# Patient Record
Sex: Male | Born: 1935 | Race: White | Hispanic: No | Marital: Single | Smoking: Never smoker
Health system: Southern US, Community
[De-identification: ages and names within clinical notes are randomized; demographics above are authoritative.]

## PROBLEM LIST (undated history)

## (undated) DIAGNOSIS — F0390 Unspecified dementia without behavioral disturbance: Secondary | ICD-10-CM

## (undated) DIAGNOSIS — D126 Benign neoplasm of colon, unspecified: Secondary | ICD-10-CM

## (undated) HISTORY — PX: CATARACT EXTRACTION W/ INTRAOCULAR LENS  IMPLANT, BILATERAL: SHX1307

---

## 2001-01-17 ENCOUNTER — Encounter (INDEPENDENT_AMBULATORY_CARE_PROVIDER_SITE_OTHER): Payer: Self-pay

## 2001-01-17 ENCOUNTER — Ambulatory Visit (HOSPITAL_COMMUNITY): Admission: RE | Admit: 2001-01-17 | Discharge: 2001-01-17 | Payer: Self-pay | Admitting: Anesthesiology

## 2002-06-08 ENCOUNTER — Other Ambulatory Visit: Admission: RE | Admit: 2002-06-08 | Discharge: 2002-06-08 | Payer: Self-pay | Admitting: Family Medicine

## 2004-09-29 ENCOUNTER — Ambulatory Visit (HOSPITAL_COMMUNITY): Admission: RE | Admit: 2004-09-29 | Discharge: 2004-09-29 | Payer: Self-pay | Admitting: Gastroenterology

## 2009-05-09 ENCOUNTER — Emergency Department (HOSPITAL_COMMUNITY): Admission: EM | Admit: 2009-05-09 | Discharge: 2009-05-09 | Payer: Self-pay | Admitting: Emergency Medicine

## 2010-12-29 ENCOUNTER — Encounter: Payer: Self-pay | Admitting: Family Medicine

## 2011-03-16 LAB — DIFFERENTIAL
Basophils Absolute: 0 10*3/uL (ref 0.0–0.1)
Basophils Relative: 1 % (ref 0–1)
Eosinophils Relative: 1 % (ref 0–5)
Lymphocytes Relative: 27 % (ref 12–46)
Monocytes Absolute: 0.6 10*3/uL (ref 0.1–1.0)
Monocytes Relative: 10 % (ref 3–12)
Neutro Abs: 4 10*3/uL (ref 1.7–7.7)

## 2011-03-16 LAB — URINALYSIS, ROUTINE W REFLEX MICROSCOPIC
Ketones, ur: NEGATIVE mg/dL
Nitrite: NEGATIVE
Protein, ur: NEGATIVE mg/dL
Urobilinogen, UA: 0.2 mg/dL (ref 0.0–1.0)

## 2011-03-16 LAB — CBC
HCT: 42.6 % (ref 39.0–52.0)
Platelets: 224 10*3/uL (ref 150–400)
RDW: 13.3 % (ref 11.5–15.5)
WBC: 6.5 10*3/uL (ref 4.0–10.5)

## 2011-03-16 LAB — COMPREHENSIVE METABOLIC PANEL
AST: 28 U/L (ref 0–37)
Albumin: 4.2 g/dL (ref 3.5–5.2)
Alkaline Phosphatase: 60 U/L (ref 39–117)
BUN: 8 mg/dL (ref 6–23)
Chloride: 106 mEq/L (ref 96–112)
GFR calc Af Amer: >60 mL/min (ref >60)
Potassium: 4.2 mEq/L (ref 3.5–5.1)
Total Bilirubin: 1.4 mg/dL — ABNORMAL HIGH (ref 0.3–1.2)
Total Protein: 7.2 g/dL (ref 6.0–8.3)

## 2011-04-24 NOTE — Procedures (Signed)
[facility]  Patient:    Anthony Fisher, Anthony Fisher                     MRN: [id number] Proc. Date: 01/17/01 Adm. Date:  [id number] Attending:  Orland Mustard                           Procedure Report  PROCEDURE:  Colonoscopy with polypectomy.  MEDICATIONS:  Fentanyl 50 mcg, Versed 5 mg IV.  SCOPE:  Olympus adult video colonoscope.  INDICATIONS FOR PROCEDURE:  A nice gentleman whose had previous colon polyps removed dating back to 67. This is done as a three year follow-up.  DESCRIPTION OF PROCEDURE:  The procedure had been explained to the patient and consent obtained. With the patient in the left lateral decubitus position, the Olympus adult video colonoscope was inserted and advanced under direct visualization. The prep was quite good. We were able to advance to the cecum without difficulty. The right lower quadrant was transilluminated, the ileocecal valve seen. The scope withdrawn. The cecum, ascending colon, hepatic flexure, transverse colon, splenic flexure, and descending colon were seen well. At  70 cm from the anal verge, a 1/2 cm polyp on a short stalk was encountered and was removed with the snare and sucked through the scope. There was no significant bleeding at the polypectomy site. The sigmoid colon was also free of any further polyps. The scope was withdrawn. The patient tolerated the procedure well maintained on low flow oxygen and pulse oximeter throughout the procedure with no obvious problem.  ASSESSMENT:  Descending colon polyps removed.  PLAN:  Check path, routine post polypectomy instructions. Will recommend repeating in three years. DD:  01/17/01 TD:  01/18/01 Job: [id number]

## 2011-04-24 NOTE — Op Note (Signed)
NAME:  Anthony Fisher, Anthony Fisher              ACCOUNT NO.:  [id number]   MEDICAL RECORD NO.:  [id number]          PATIENT TYPE:  AMB   LOCATION:  ENDO                         FACILITY:  [facility]   PHYSICIAN:  James L. Malon Kindle., M.D.DATE OF BIRTH:  06-Sep-1926   DATE OF PROCEDURE:  DATE OF DISCHARGE:                                 OPERATIVE REPORT   PROCEDURE:  Colonoscopy.   MEDICATIONS:  1.  Fentanyl 50 mcg.  2.  Versed 5 mg IV.   SCOPE:  Olympus pediatric colonoscopy.   INDICATION:  Previous history of adenomatous colon polyps.   DESCRIPTION OF PROCEDURE:  The procedure was explained to the patient and  consent obtained.  With the patient in the left lateral decubitus position,  the Olympus scope was inserted.  The pediatric adjustable scope was used.  We were able to easily reach the cecum.  Ileocecal valve and appendiceal  orifice were seen.  The scope was withdrawn in the cecum.  Ascending colon,  transverse colon, splenic flexure, descending, and sigmoid colon were seen  well. No polyps or other lesions were seen.  The scope was withdrawn.  The  patient tolerated the procedure well.   ASSESSMENT:  Previous history of colon polyps, with negative colonoscopy.   PLAN:  Will recommend yearly hemoccults, and would recommend repeating  colonoscopy in five years.      JLE/MEDQ  D:  09/29/2004  T:  09/29/2004  Job:  [id number]   cc:   Saul Fordyce, N.P.  [facility]  [facility]  [place] L. Malon Kindle., M.D.  [address]  [place]  [place] [address]  Fax: [phone]

## 2015-02-06 ENCOUNTER — Ambulatory Visit
Admission: RE | Admit: 2015-02-06 | Discharge: 2015-02-06 | Disposition: A | Discharge status: Home / Self Care | Payer: Medicare Other | Source: Ambulatory Visit | Attending: Internal Medicine | Admitting: Internal Medicine

## 2015-02-06 ENCOUNTER — Other Ambulatory Visit: Payer: Self-pay | Admitting: Internal Medicine

## 2015-02-06 DIAGNOSIS — R609 Edema, unspecified: Secondary | ICD-10-CM

## 2015-02-06 DIAGNOSIS — S5001XA Contusion of right elbow, initial encounter: Secondary | ICD-10-CM | POA: Diagnosis not present

## 2015-02-06 DIAGNOSIS — S5000XA Contusion of unspecified elbow, initial encounter: Secondary | ICD-10-CM | POA: Diagnosis not present

## 2015-03-04 DIAGNOSIS — Z1389 Encounter for screening for other disorder: Secondary | ICD-10-CM | POA: Diagnosis not present

## 2015-03-04 DIAGNOSIS — R1032 Left lower quadrant pain: Secondary | ICD-10-CM | POA: Diagnosis not present

## 2015-03-04 DIAGNOSIS — Z1211 Encounter for screening for malignant neoplasm of colon: Secondary | ICD-10-CM | POA: Diagnosis not present

## 2015-03-04 DIAGNOSIS — E784 Other hyperlipidemia: Secondary | ICD-10-CM | POA: Diagnosis not present

## 2015-03-04 DIAGNOSIS — Z0000 Encounter for general adult medical examination without abnormal findings: Secondary | ICD-10-CM | POA: Diagnosis not present

## 2015-03-04 DIAGNOSIS — I70219 Atherosclerosis of native arteries of extremities with intermittent claudication, unspecified extremity: Secondary | ICD-10-CM | POA: Diagnosis not present

## 2015-03-04 DIAGNOSIS — R42 Dizziness and giddiness: Secondary | ICD-10-CM | POA: Diagnosis not present

## 2015-03-04 DIAGNOSIS — R002 Palpitations: Secondary | ICD-10-CM | POA: Diagnosis not present

## 2015-03-04 DIAGNOSIS — H8309 Labyrinthitis, unspecified ear: Secondary | ICD-10-CM | POA: Diagnosis not present

## 2015-03-04 DIAGNOSIS — H9113 Presbycusis, bilateral: Secondary | ICD-10-CM | POA: Diagnosis not present

## 2015-04-02 DIAGNOSIS — E780 Pure hypercholesterolemia: Secondary | ICD-10-CM | POA: Diagnosis not present

## 2015-04-02 DIAGNOSIS — R5383 Other fatigue: Secondary | ICD-10-CM | POA: Diagnosis not present

## 2015-04-02 DIAGNOSIS — Z131 Encounter for screening for diabetes mellitus: Secondary | ICD-10-CM | POA: Diagnosis not present

## 2015-04-02 DIAGNOSIS — Z125 Encounter for screening for malignant neoplasm of prostate: Secondary | ICD-10-CM | POA: Diagnosis not present

## 2015-04-02 DIAGNOSIS — Z79899 Other long term (current) drug therapy: Secondary | ICD-10-CM | POA: Diagnosis not present

## 2016-11-10 ENCOUNTER — Other Ambulatory Visit: Payer: Self-pay | Admitting: Internal Medicine

## 2016-11-10 DIAGNOSIS — R413 Other amnesia: Secondary | ICD-10-CM

## 2016-11-12 ENCOUNTER — Ambulatory Visit
Admission: RE | Admit: 2016-11-12 | Discharge: 2016-11-12 | Disposition: A | Discharge status: Home / Self Care | Payer: Medicare Other | Source: Ambulatory Visit | Attending: Internal Medicine | Admitting: Internal Medicine

## 2016-11-12 DIAGNOSIS — R413 Other amnesia: Secondary | ICD-10-CM

## 2017-02-15 ENCOUNTER — Emergency Department (HOSPITAL_COMMUNITY): Payer: Medicare Other

## 2017-02-15 ENCOUNTER — Encounter (HOSPITAL_COMMUNITY): Payer: Self-pay

## 2017-02-15 ENCOUNTER — Inpatient Hospital Stay (HOSPITAL_COMMUNITY)
Admission: EM | Admit: 2017-02-15 | Discharge: 2017-02-23 | DRG: 065 | Disposition: A | Discharge status: Rehabilitation Facility | Payer: Medicare Other | Attending: Neurology | Admitting: Neurology

## 2017-02-15 DIAGNOSIS — G8194 Hemiplegia, unspecified affecting left nondominant side: Secondary | ICD-10-CM | POA: Diagnosis present

## 2017-02-15 DIAGNOSIS — R7989 Other specified abnormal findings of blood chemistry: Secondary | ICD-10-CM | POA: Diagnosis not present

## 2017-02-15 DIAGNOSIS — I509 Heart failure, unspecified: Secondary | ICD-10-CM

## 2017-02-15 DIAGNOSIS — R1310 Dysphagia, unspecified: Secondary | ICD-10-CM | POA: Diagnosis present

## 2017-02-15 DIAGNOSIS — I110 Hypertensive heart disease with heart failure: Secondary | ICD-10-CM | POA: Diagnosis present

## 2017-02-15 DIAGNOSIS — H9190 Unspecified hearing loss, unspecified ear: Secondary | ICD-10-CM | POA: Diagnosis present

## 2017-02-15 DIAGNOSIS — R471 Dysarthria and anarthria: Secondary | ICD-10-CM | POA: Diagnosis present

## 2017-02-15 DIAGNOSIS — R059 Cough, unspecified: Secondary | ICD-10-CM

## 2017-02-15 DIAGNOSIS — I619 Nontraumatic intracerebral hemorrhage, unspecified: Secondary | ICD-10-CM | POA: Diagnosis not present

## 2017-02-15 DIAGNOSIS — I618 Other nontraumatic intracerebral hemorrhage: Secondary | ICD-10-CM | POA: Diagnosis not present

## 2017-02-15 DIAGNOSIS — I610 Nontraumatic intracerebral hemorrhage in hemisphere, subcortical: Secondary | ICD-10-CM | POA: Diagnosis not present

## 2017-02-15 DIAGNOSIS — D649 Anemia, unspecified: Secondary | ICD-10-CM | POA: Diagnosis present

## 2017-02-15 DIAGNOSIS — F0390 Unspecified dementia without behavioral disturbance: Secondary | ICD-10-CM | POA: Diagnosis present

## 2017-02-15 DIAGNOSIS — R4781 Slurred speech: Secondary | ICD-10-CM | POA: Diagnosis present

## 2017-02-15 DIAGNOSIS — E876 Hypokalemia: Secondary | ICD-10-CM | POA: Diagnosis present

## 2017-02-15 DIAGNOSIS — I161 Hypertensive emergency: Secondary | ICD-10-CM | POA: Diagnosis not present

## 2017-02-15 DIAGNOSIS — I10 Essential (primary) hypertension: Secondary | ICD-10-CM | POA: Diagnosis not present

## 2017-02-15 DIAGNOSIS — Z9842 Cataract extraction status, left eye: Secondary | ICD-10-CM

## 2017-02-15 DIAGNOSIS — R29810 Facial weakness: Secondary | ICD-10-CM | POA: Diagnosis present

## 2017-02-15 DIAGNOSIS — G8192 Hemiplegia, unspecified affecting left dominant side: Secondary | ICD-10-CM | POA: Diagnosis not present

## 2017-02-15 DIAGNOSIS — I5042 Chronic combined systolic (congestive) and diastolic (congestive) heart failure: Secondary | ICD-10-CM | POA: Diagnosis not present

## 2017-02-15 DIAGNOSIS — R0989 Other specified symptoms and signs involving the circulatory and respiratory systems: Secondary | ICD-10-CM

## 2017-02-15 DIAGNOSIS — R1312 Dysphagia, oropharyngeal phase: Secondary | ICD-10-CM | POA: Diagnosis not present

## 2017-02-15 DIAGNOSIS — I471 Supraventricular tachycardia: Secondary | ICD-10-CM | POA: Diagnosis not present

## 2017-02-15 DIAGNOSIS — R339 Retention of urine, unspecified: Secondary | ICD-10-CM | POA: Diagnosis present

## 2017-02-15 DIAGNOSIS — R05 Cough: Secondary | ICD-10-CM

## 2017-02-15 DIAGNOSIS — H53462 Homonymous bilateral field defects, left side: Secondary | ICD-10-CM | POA: Diagnosis present

## 2017-02-15 DIAGNOSIS — R531 Weakness: Secondary | ICD-10-CM | POA: Diagnosis not present

## 2017-02-15 DIAGNOSIS — R509 Fever, unspecified: Secondary | ICD-10-CM | POA: Diagnosis not present

## 2017-02-15 DIAGNOSIS — Z9841 Cataract extraction status, right eye: Secondary | ICD-10-CM

## 2017-02-15 DIAGNOSIS — I69391 Dysphagia following cerebral infarction: Secondary | ICD-10-CM | POA: Diagnosis not present

## 2017-02-15 DIAGNOSIS — Z4659 Encounter for fitting and adjustment of other gastrointestinal appliance and device: Secondary | ICD-10-CM

## 2017-02-15 DIAGNOSIS — I6789 Other cerebrovascular disease: Secondary | ICD-10-CM | POA: Diagnosis not present

## 2017-02-15 DIAGNOSIS — R739 Hyperglycemia, unspecified: Secondary | ICD-10-CM | POA: Diagnosis present

## 2017-02-15 DIAGNOSIS — N390 Urinary tract infection, site not specified: Secondary | ICD-10-CM | POA: Diagnosis not present

## 2017-02-15 DIAGNOSIS — I615 Nontraumatic intracerebral hemorrhage, intraventricular: Secondary | ICD-10-CM

## 2017-02-15 DIAGNOSIS — M79609 Pain in unspecified limb: Secondary | ICD-10-CM | POA: Diagnosis not present

## 2017-02-15 DIAGNOSIS — Z961 Presence of intraocular lens: Secondary | ICD-10-CM | POA: Diagnosis present

## 2017-02-15 DIAGNOSIS — J9811 Atelectasis: Secondary | ICD-10-CM | POA: Diagnosis not present

## 2017-02-15 DIAGNOSIS — W19XXXA Unspecified fall, initial encounter: Secondary | ICD-10-CM | POA: Diagnosis not present

## 2017-02-15 DIAGNOSIS — F0391 Unspecified dementia with behavioral disturbance: Secondary | ICD-10-CM | POA: Diagnosis not present

## 2017-02-15 DIAGNOSIS — A499 Bacterial infection, unspecified: Secondary | ICD-10-CM | POA: Diagnosis not present

## 2017-02-15 HISTORY — DX: Unspecified dementia, unspecified severity, without behavioral disturbance, psychotic disturbance, mood disturbance, and anxiety: F03.90

## 2017-02-15 HISTORY — DX: Benign neoplasm of colon, unspecified: D12.6

## 2017-02-15 LAB — CBC
HCT: 37.5 % — ABNORMAL LOW (ref 39.0–52.0)
Hemoglobin: 12.9 g/dL — ABNORMAL LOW (ref 13.0–17.0)
MCH: 33.1 pg (ref 26.0–34.0)
MCHC: 34.4 g/dL (ref 30.0–36.0)
MCV: 96.2 fL (ref 78.0–100.0)
Platelets: 206 10*3/uL (ref 150–400)
RBC: 3.9 MIL/uL — ABNORMAL LOW (ref 4.22–5.81)
RDW: 12.8 % (ref 11.5–15.5)
WBC: 5.8 10*3/uL (ref 4.0–10.5)

## 2017-02-15 LAB — COMPREHENSIVE METABOLIC PANEL
ALBUMIN: 3.9 g/dL (ref 3.5–5.0)
ALT: 8 U/L — ABNORMAL LOW (ref 17–63)
AST: 18 U/L (ref 15–41)
Alkaline Phosphatase: 63 U/L (ref 38–126)
Anion gap: 10 (ref 5–15)
BUN: 16 mg/dL (ref 6–20)
CHLORIDE: 108 mmol/L (ref 101–111)
CO2: 23 mmol/L (ref 22–32)
Calcium: 9.2 mg/dL (ref 8.9–10.3)
Creatinine, Ser: 1.08 mg/dL (ref 0.61–1.24)
GFR calc Af Amer: >60 mL/min (ref >60)
GFR calc non Af Amer: 59 mL/min — ABNORMAL LOW (ref >60)
GLUCOSE: 133 mg/dL — AB (ref 65–99)
POTASSIUM: 3.6 mmol/L (ref 3.5–5.1)
SODIUM: 141 mmol/L (ref 135–145)
Total Bilirubin: 0.6 mg/dL (ref 0.3–1.2)
Total Protein: 6.2 g/dL — ABNORMAL LOW (ref 6.5–8.1)

## 2017-02-15 LAB — DIFFERENTIAL
BASOS ABS: 0.1 10*3/uL (ref 0.0–0.1)
BASOS PCT: 1 %
EOS ABS: 0.2 10*3/uL (ref 0.0–0.7)
Eosinophils Relative: 3 %
Lymphocytes Relative: 39 %
Lymphs Abs: 2.3 10*3/uL (ref 0.7–4.0)
Monocytes Absolute: 0.8 10*3/uL (ref 0.1–1.0)
Monocytes Relative: 13 %
NEUTROS PCT: 44 %
Neutro Abs: 2.6 10*3/uL (ref 1.7–7.7)

## 2017-02-15 LAB — I-STAT CHEM 8, ED
BUN: 18 mg/dL (ref 6–20)
CREATININE: 1 mg/dL (ref 0.61–1.24)
Calcium, Ion: 1.14 mmol/L — ABNORMAL LOW (ref 1.15–1.40)
Chloride: 106 mmol/L (ref 101–111)
Glucose, Bld: 132 mg/dL — ABNORMAL HIGH (ref 65–99)
HEMATOCRIT: 35 % — AB (ref 39.0–52.0)
HEMOGLOBIN: 11.9 g/dL — AB (ref 13.0–17.0)
Potassium: 3.6 mmol/L (ref 3.5–5.1)
Sodium: 142 mmol/L (ref 135–145)
TCO2: 25 mmol/L (ref 0–100)

## 2017-02-15 LAB — ETHANOL

## 2017-02-15 LAB — CBG MONITORING, ED: GLUCOSE-CAPILLARY: 124 mg/dL — AB (ref 65–99)

## 2017-02-15 LAB — I-STAT TROPONIN, ED: TROPONIN I, POC: 0.01 ng/mL (ref 0.00–0.08)

## 2017-02-15 LAB — APTT: APTT: 27 s (ref 24–36)

## 2017-02-15 LAB — PROTIME-INR
INR: 1.07
Prothrombin Time: 13.9 seconds (ref 11.4–15.2)

## 2017-02-15 MED ORDER — LABETALOL HCL 5 MG/ML IV SOLN
INTRAVENOUS | Status: AC
  Administered 2017-02-15: 10 mg
  Filled 2017-02-15: qty 4

## 2017-02-15 MED ORDER — ACETAMINOPHEN 650 MG RE SUPP
650.0000 mg | RECTAL | Status: DC | PRN
  Administered 2017-02-20: 650 mg via RECTAL
  Filled 2017-02-15: qty 1

## 2017-02-15 MED ORDER — NICARDIPINE HCL IN NACL 20-0.86 MG/200ML-% IV SOLN
0.0000 mg/h | INTRAVENOUS | Status: DC
  Administered 2017-02-15: 5 mg/h via INTRAVENOUS
  Administered 2017-02-15 – 2017-02-16 (×4): 10 mg/h via INTRAVENOUS
  Filled 2017-02-15 (×5): qty 200

## 2017-02-15 MED ORDER — STROKE: EARLY STAGES OF RECOVERY BOOK
Freq: Once | Status: AC
  Administered 2017-02-15: 20:00:00
  Filled 2017-02-15: qty 1

## 2017-02-15 MED ORDER — IOPAMIDOL (ISOVUE-370) INJECTION 76%
INTRAVENOUS | Status: AC
  Filled 2017-02-15: qty 100

## 2017-02-15 MED ORDER — ACETAMINOPHEN 325 MG PO TABS: 650.0000 mg | ORAL_TABLET | ORAL | Status: DC | PRN

## 2017-02-15 MED ORDER — NICARDIPINE HCL IN NACL 20-0.86 MG/200ML-% IV SOLN
INTRAVENOUS | Status: AC
  Filled 2017-02-15: qty 200

## 2017-02-15 MED ORDER — SENNOSIDES-DOCUSATE SODIUM 8.6-50 MG PO TABS: 1.0000 | ORAL_TABLET | Freq: Two times a day (BID) | ORAL | Status: DC

## 2017-02-15 MED ORDER — ACETAMINOPHEN 160 MG/5ML PO SOLN: 650.0000 mg | ORAL | Status: DC | PRN

## 2017-02-15 MED ORDER — PANTOPRAZOLE SODIUM 40 MG IV SOLR
40.0000 mg | Freq: Every day | INTRAVENOUS | Status: DC
  Administered 2017-02-15 – 2017-02-18 (×4): 40 mg via INTRAVENOUS
  Filled 2017-02-15 (×4): qty 40

## 2017-02-15 MED ORDER — SODIUM CHLORIDE 0.9 % IV SOLN
INTRAVENOUS | Status: DC
Start: 2017-02-15 — End: 2017-02-18
  Administered 2017-02-15 – 2017-02-18 (×5): via INTRAVENOUS

## 2017-02-15 NOTE — Consult Note (Deleted)
Entered in Error

## 2017-02-15 NOTE — ED Triage Notes (Signed)
Per EMS, pt found laying on the floor by family at 1830 this evening. Pt last seen ambulating normally this morning at 0800. Pt found to be flaccid in the left arm by EMS with slurred speech and facial droop. Pt has hx of dementia but no other medical hx and no medications. Pt hypertensive with EMS 773 systolic. Pulse 80 and irregular, 16 RR, spo2 95% on RA.

## 2017-02-15 NOTE — H&P (Signed)
Neurology Consultation Reason for Consult: Left-sided weakness Referring Physician: Stark Jock, D  CC: Left-sided weakness  History is obtained from: Patient  HPI: Anthony Fisher is a 83 y.o. male with a history of mild dementia who presents with left-sided weakness that started earlier today. He apparently was up and around around 8 or 9 AM and was normal at that time. Then "a couple of hours later" he was noticed to be stumbling, and decided that he wanted to lay down. He was then not seen until 7 PM, when they heard a fall and found him with left-sided weakness.   LKW: 8 AM tpa given?: no, ICH ICH Score: 2    ROS: A 14 point ROS was performed and is negative except as noted in the HPI.   Past Medical History:  Diagnosis Date  . Dementia      History reviewed. No pertinent family history.   Social History:  reports that he has never smoked. He has never used smokeless tobacco. He reports that he does not drink alcohol or use drugs.   Exam: Current vital signs: BP 134/63   Pulse 73   Temp 97.5 F (36.4 C) (Axillary)   Resp 15   Ht 5\' 9"  (1.753 m)   Wt 82.2 kg (181 lb 3.5 oz)   SpO2 96%   BMI 26.76 kg/m  Vital signs in last 24 hours: Temp:  [97.5 F (36.4 C)-98.5 F (36.9 C)] 97.5 F (36.4 C) (03/12 2030) Pulse Rate:  [63-78] 73 (03/12 2115) Resp:  [15-23] 15 (03/12 2115) BP: (107-184)/(63-94) 134/63 (03/12 2115) SpO2:  [94 %-98 %] 96 % (03/12 2115) Weight:  [81.6 kg (180 lb)-82.2 kg (181 lb 3.5 oz)] 82.2 kg (181 lb 3.5 oz) (03/12 2030)   Physical Exam  Constitutional: Appears Elderly Psych: Affect appropriate to situation Eyes: No scleral injection HENT: No OP obstrucion Head: Normocephalic.  Cardiovascular: Normal rate and regular rhythm.  Respiratory: Effort normal and breath sounds normal to anterior ascultation GI: Soft.  No distension. There is no tenderness.  Skin: WDI  Neuro: Mental Status: Patient is awake, alert,He is oriented to age but not  month. Cranial Nerves: II: He appears to have a left hemianopia. Pupils are equal, round, and reactive to light.   III,IV, VI: He has a right gaze preference V: Facial sensation is symmetric to temperature VII: Facial movement is difficult to be certain of, he may have a very mild left facial weakness VIII: hearing is intact to voice X: Uvula elevates symmetrically XI: Shoulder shrug is symmetric. XII: tongue deviates to the left Motor: He has intact strength on the right, on the left he is able to lift his arm and leg against gravity slightly but not able to hold them out of bed. 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 He was extremely hypertensive on arrival, currently using Cardene to control this. He will need to be admitted to the intensive care unit for close monitoring.  Recommendations: 1) Admit to ICU 2) no antiplatelets or anticoagulants 3) blood pressure control with goal systolic 361 - 443 4) Frequent neuro checks 5) If symptoms worsen or there is decreased mental status, repeat stat head CT 6) PT,OT,ST  This patient is critically ill  and at significant risk of neurological worsening, death and care requires constant monitoring of vital signs, hemodynamics,respiratory and cardiac monitoring, neurological assessment, discussion with family, other specialists and medical decision making of high complexity. I spent 45 minutes of neurocritical care time  in the care of  this patient.  Roland Rack, MD [facility] [phone]  If 7pm- 7am, please page neurology on call as listed in [place]. 02/15/2017  10:03 PM

## 2017-02-15 NOTE — Code Documentation (Signed)
Responded to Code Stroke called at 1854.  Pt arrived to ED at 1906.  Pt normal around 8-9 am per family.  Pt stated he was going to go lay down.  Family heard sound around 1900 and found patient on floor with L sided weakness.  Cbg-175, NIH-11. CT-R thalamic hemorrhage.  SBP-190s.  Labetolol IV given and Cardene gtt started.  Plan to admit to ICU.

## 2017-02-15 NOTE — ED Provider Notes (Signed)
[facility] DEPT Provider Note   CSN: [id number] Arrival date & time: 02/15/17  7829   An emergency department physician performed an initial assessment on this suspected stroke patient at 26.  History   Chief Complaint Chief Complaint  Patient presents with  . Code Stroke    HPI Anthony Fisher is a 83 y.o. male.  HPI   83 year old male with history of dementia, presenting with slurred speech, left-sided facial droop, and left hemiparesis. Patient was found lying on the floor in his home by his family at 3 this evening. He was last seen ambulating normally at 0800. Patient was hypertensive with EMS with blood pressure of 562 systolic. Code stroke activated on patient's arrival, with neurology at bedside. Patient is stable to go back to CT scanner.  Past Medical History:  Diagnosis Date  . Dementia     There are no active problems to display for this patient.   History reviewed. No pertinent surgical history.     Home Medications    Prior to Admission medications   Medication Sig Start Date End Date Taking? Authorizing Provider  polyethylene glycol powder (GLYCOLAX/MIRALAX) powder Take 17 g by mouth daily.   Yes Historical Provider, MD    Family History History reviewed. No pertinent family history.  Social History Social History  Substance Use Topics  . Smoking status: Never Smoker  . Smokeless tobacco: Never Used  . Alcohol use No     Allergies   Tape   Review of Systems Review of Systems  Unable to perform ROS: Dementia     Physical Exam Updated Vital Signs BP 128/62   Pulse 72   Temp 97.5 F (36.4 C) (Axillary)   Resp 17   Ht 5\' 9"  (1.753 m)   Wt 82.2 kg   SpO2 95%   BMI 26.76 kg/m   Physical Exam  Constitutional: He appears well-developed and well-nourished. No distress.  HENT:  Head: Normocephalic and atraumatic.  Eyes: Pupils are equal, round, and reactive to light.  Neck: Normal range of motion. Neck supple.    Cardiovascular: Normal rate and regular rhythm.   Pulmonary/Chest: Effort normal and breath sounds normal. No respiratory distress.  Abdominal: Soft. Bowel sounds are normal. He exhibits no distension. There is no tenderness.  Musculoskeletal: He exhibits no edema or deformity.  Neurological: He is alert. He exhibits normal muscle tone.  L-sided facial droop, 5/5 strength in the R hemibody, 3/5 strength in the L hemibody  Skin: Skin is warm and dry. He is not diaphoretic.     ED Treatments / Results  Labs (all labs ordered are listed, but only abnormal results are displayed) Labs Reviewed  CBC - Abnormal; Notable for the following:       Result Value   RBC 3.90 (*)    Hemoglobin 12.9 (*)    HCT 37.5 (*)    All other components within normal limits  COMPREHENSIVE METABOLIC PANEL - Abnormal; Notable for the following:    Glucose, Bld 133 (*)    Total Protein 6.2 (*)    ALT 8 (*)    GFR calc non Af Amer 59 (*)    All other components within normal limits  I-STAT CHEM 8, ED - Abnormal; Notable for the following:    Glucose, Bld 132 (*)    Calcium, Ion 1.14 (*)    Hemoglobin 11.9 (*)    HCT 35.0 (*)    All other components within normal limits  CBG MONITORING, ED -  Abnormal; Notable for the following:    Glucose-Capillary 124 (*)    All other components within normal limits  MRSA PCR SCREENING  ETHANOL  PROTIME-INR  APTT  DIFFERENTIAL  RAPID URINE DRUG SCREEN, HOSP PERFORMED  URINALYSIS, ROUTINE W REFLEX MICROSCOPIC  I-STAT TROPOININ, ED    EKG  EKG Interpretation  Date/Time:  Monday February 15 2017 19:24:13 EDT Ventricular Rate:  72 PR Interval:    QRS Duration: 154 QT Interval:  439 QTC Calculation: 481 R Axis:   -80 Text Interpretation:  Sinus rhythm RBBB and LAFB Confirmed by DELO  MD, DOUGLAS ([id number]) on 02/15/2017 8:43:12 PM       Radiology Ct Head Code Stroke W/o Cm  Result Date: 02/15/2017 CLINICAL DATA:  Code stroke.  Unresponsive patient EXAM: CT HEAD  WITHOUT CONTRAST TECHNIQUE: Contiguous axial images were obtained from the base of the skull through the vertex without intravenous contrast. COMPARISON:  Head CT 11/12/2016 FINDINGS: Brain: There is an intraparenchymal hematoma within the right thalamus extending into the right lateral ventricle atrium. The hematoma measures 2.0 x 2.8 x 2.8 cm. The CSF spaces remain widely patent without hydrocephalus. There is no midline shift or significant mass effect. Vascular: Atherosclerotic calcification of the vertebral and internal carotid arteries at the skull base. Skull: Normal Sinuses/Orbits: Mild bilateral maxillary mucosal thickening. Normal orbits. Other: None IMPRESSION: 1. Intraparenchymal hematoma within the right thalamus extending into the atrium of the right lateral ventricle. The location is most consistent with hypertensive hemorrhage. 2. No midline shift or other significant mass effect. 3. Intraventricular blood products may lead to hydrocephalus. Short interval surveillance is warranted. Critical Value/emergent results were called by telephone at the time of interpretation on 02/15/2017 at 7:24 pm to Dr. Veryl Speak , who verbally acknowledged these results. Electronically Signed   By: Ulyses Jarred M.D.   On: 02/15/2017 19:28    Procedures Procedures (including critical care time)  Medications Ordered in ED Medications  iopamidol (ISOVUE-370) 76 % injection (  Canceled Entry 02/15/17 1915)  acetaminophen (TYLENOL) tablet 650 mg (not administered)    Or  acetaminophen (TYLENOL) solution 650 mg (not administered)    Or  acetaminophen (TYLENOL) suppository 650 mg (not administered)  senna-docusate (Senokot-S) tablet 1 tablet (1 tablet Oral Not Given 02/15/17 2112)  pantoprazole (PROTONIX) injection 40 mg (40 mg Intravenous Given 02/15/17 2120)  nicardipine (CARDENE) 20mg  in 0.86% saline 278ml IV infusion (0.1 mg/ml) (10 mg/hr Intravenous New Bag/Given 02/15/17 2330)  0.9 %  sodium chloride  infusion ( Intravenous Rate/Dose Verify 02/15/17 2300)  labetalol (NORMODYNE,TRANDATE) 5 MG/ML injection (10 mg  Given 02/15/17 1929)  niCARdipine in saline (CARDENE-IV) 20-0.86 MG/200ML-% infusion SOLN (  Duplicate 03/04/50 8841)   stroke: mapping our early stages of recovery book ( Does not apply Given 02/15/17 1945)     Initial Impression / Assessment and Plan / ED Course  I have reviewed the triage vital signs and the nursing notes.  Pertinent labs & imaging results that were available during my care of the patient were reviewed by me and considered in my medical decision making (see chart for details).    Patient noted to have left-sided facial droop, left-sided hemiparesis, and left hemi-neglect on exam. CT remarkable for right thalamic hematoma consistent with hypertensive hemorrhage, with no midline shift. Patient started on Cardine drip, and he will be admitted to the neuro ICU for further management.   Care of patient overseen by my attending, Dr. Stark Jock.  Final Clinical Impressions(s) / ED  Diagnoses   Final diagnoses:  None    New Prescriptions Current Discharge Medication List       Zipporah Plants, MD 02/16/17 6122    Veryl Speak, MD 02/16/17 1549

## 2017-02-15 NOTE — Progress Notes (Signed)
[facility] Progress Note Patient Name: Anthony Fisher DOB: [birth year] MRN: [id number]   Date of Service  02/15/2017  HPI/Events of Note  Thalamic ICH, cardene gtt Stable on camera check  eICU Interventions  No ICU intervention     Intervention Category Evaluation Type: New Patient Evaluation  Simonne Maffucci 02/15/2017, 11:26 PM

## 2017-02-16 ENCOUNTER — Inpatient Hospital Stay (HOSPITAL_COMMUNITY): Payer: Medicare Other

## 2017-02-16 ENCOUNTER — Encounter (HOSPITAL_COMMUNITY): Payer: Self-pay | Admitting: Radiology

## 2017-02-16 DIAGNOSIS — G8194 Hemiplegia, unspecified affecting left nondominant side: Secondary | ICD-10-CM

## 2017-02-16 DIAGNOSIS — R471 Dysarthria and anarthria: Secondary | ICD-10-CM

## 2017-02-16 DIAGNOSIS — I619 Nontraumatic intracerebral hemorrhage, unspecified: Secondary | ICD-10-CM | POA: Diagnosis present

## 2017-02-16 DIAGNOSIS — I615 Nontraumatic intracerebral hemorrhage, intraventricular: Secondary | ICD-10-CM

## 2017-02-16 DIAGNOSIS — I161 Hypertensive emergency: Secondary | ICD-10-CM

## 2017-02-16 DIAGNOSIS — I610 Nontraumatic intracerebral hemorrhage in hemisphere, subcortical: Secondary | ICD-10-CM

## 2017-02-16 DIAGNOSIS — F0390 Unspecified dementia without behavioral disturbance: Secondary | ICD-10-CM

## 2017-02-16 DIAGNOSIS — I6789 Other cerebrovascular disease: Secondary | ICD-10-CM

## 2017-02-16 LAB — LIPID PANEL
Cholesterol: 131 mg/dL (ref 0–200)
HDL: 47 mg/dL (ref >40)
LDL Cholesterol: 70 mg/dL (ref 0–99)
Total CHOL/HDL Ratio: 2.8 RATIO
Triglycerides: 69 mg/dL (ref <150)
VLDL: 14 mg/dL (ref 0–40)

## 2017-02-16 LAB — TSH: TSH: 1.063 u[IU]/mL (ref 0.350–4.500)

## 2017-02-16 LAB — CBC
HCT: 37.5 % — ABNORMAL LOW (ref 39.0–52.0)
Hemoglobin: 12.8 g/dL — ABNORMAL LOW (ref 13.0–17.0)
MCH: 33 pg (ref 26.0–34.0)
MCHC: 34.1 g/dL (ref 30.0–36.0)
MCV: 96.6 fL (ref 78.0–100.0)
PLATELETS: 199 10*3/uL (ref 150–400)
RBC: 3.88 MIL/uL — ABNORMAL LOW (ref 4.22–5.81)
RDW: 13.1 % (ref 11.5–15.5)
WBC: 9.2 10*3/uL (ref 4.0–10.5)

## 2017-02-16 LAB — BASIC METABOLIC PANEL
Anion gap: 8 (ref 5–15)
BUN: 15 mg/dL (ref 6–20)
CO2: 22 mmol/L (ref 22–32)
CREATININE: 0.99 mg/dL (ref 0.61–1.24)
Calcium: 8.5 mg/dL — ABNORMAL LOW (ref 8.9–10.3)
Chloride: 110 mmol/L (ref 101–111)
Glucose, Bld: 129 mg/dL — ABNORMAL HIGH (ref 65–99)
Potassium: 3.3 mmol/L — ABNORMAL LOW (ref 3.5–5.1)
SODIUM: 140 mmol/L (ref 135–145)

## 2017-02-16 LAB — ECHOCARDIOGRAM COMPLETE
HEIGHTINCHES: 69 in
WEIGHTICAEL: 2899.49 [oz_av]

## 2017-02-16 LAB — MRSA PCR SCREENING: MRSA by PCR: NEGATIVE

## 2017-02-16 LAB — VITAMIN B12: VITAMIN B 12: 277 pg/mL (ref 180–914)

## 2017-02-16 MED ORDER — CLEVIDIPINE BUTYRATE 0.5 MG/ML IV EMUL
0.0000 mg/h | INTRAVENOUS | Status: DC
  Administered 2017-02-16: 1 mg/h via INTRAVENOUS
  Administered 2017-02-16: 5 mg/h via INTRAVENOUS
  Administered 2017-02-16: 9 mg/h via INTRAVENOUS
  Administered 2017-02-16: 6 mg/h via INTRAVENOUS
  Administered 2017-02-17: 9 mg/h via INTRAVENOUS
  Administered 2017-02-17: 10 mg/h via INTRAVENOUS
  Administered 2017-02-17: 9 mg/h via INTRAVENOUS
  Administered 2017-02-17: 12 mg/h via INTRAVENOUS
  Administered 2017-02-18: 8 mg/h via INTRAVENOUS
  Administered 2017-02-18: 10 mg/h via INTRAVENOUS
  Administered 2017-02-18: 11 mg/h via INTRAVENOUS
  Filled 2017-02-16 (×11): qty 50

## 2017-02-16 MED ORDER — IOPAMIDOL (ISOVUE-370) INJECTION 76%
INTRAVENOUS | Status: AC
  Administered 2017-02-16: 50 mL
  Filled 2017-02-16: qty 50

## 2017-02-16 NOTE — Progress Notes (Signed)
I will follow up with pt an family in the am to discuss potential rehab options pending caregiver support, bed viability and insurance approval. [phone]

## 2017-02-16 NOTE — Progress Notes (Signed)
Rehab Admissions Coordinator Note:  Patient was screened by Retta Diones for appropriateness for an Inpatient Acute Rehab Consult.  At this time, we are recommending Inpatient Rehab consult.  Retta Diones 02/16/2017, 2:10 PM  I can be reached at [phone].

## 2017-02-16 NOTE — Progress Notes (Signed)
PT Cancellation Note  Patient Details Name: Anthony Fisher MRN: [id number] DOB: 1926-11-28   Cancelled Treatment:    Reason Eval/Treat Not Completed: Patient not medically ready Pt on bedrest. Will await increase in activity orders prior to PT evaluation.   Marguarite Arbour A Donnika Kucher 02/16/2017, 8:02 AM Wray Kearns, PT, DPT [phone]

## 2017-02-16 NOTE — Progress Notes (Signed)
  Echocardiogram 2D Echocardiogram has been performed.  Anthony Fisher 02/16/2017, 3:23 PM

## 2017-02-16 NOTE — Evaluation (Signed)
Clinical/Bedside Swallow Evaluation Patient Details  Name: Anthony Fisher MRN: [id number] Date of Birth: 1926-02-10  Today's Date: 02/16/2017 Time: SLP Start Time (ACUTE ONLY): 1522 SLP Stop Time (ACUTE ONLY): 1529 SLP Time Calculation (min) (ACUTE ONLY): 7 min  Past Medical History:  Past Medical History:  Diagnosis Date  . Dementia    Past Surgical History: History reviewed. No pertinent surgical history. HPI:  Pt is a 83 y.o. male who presents from home to ED on 02/15/17 with L-sided weakness. CT shows intraparenchymal hematoma within R thalamus extending into R lateral ventricle. Pertinent PMH includes dementia.    Assessment / Plan / Recommendation Clinical Impression  Pt has left-sided facial weakness with overall poor oral control of boluses. Moderate amount of oral secretions noted at baseline that required oral suction. Anterior loss of ice chips noted along with concern for premature spillage into the pharynx. Immediate coughing with thin liquids is concerning for aspiration, with delayed coughing also noted with ice chips. Recommend to remain NPO for today with focus on thorough oral care and secretion management. Will continue to follow for PO readiness versus need for instrumental exam. SLP Visit Diagnosis: Dysphagia, oropharyngeal phase (R13.12)    Aspiration Risk  Severe aspiration risk    Diet Recommendation NPO   Medication Administration: Via alternative means    Other  Recommendations Oral Care Recommendations: Oral care QID Other Recommendations: Have oral suction available   Follow up Recommendations Inpatient Rehab      Frequency and Duration min 2x/week  2 weeks       Prognosis Prognosis for Safe Diet Advancement: Good Barriers to Reach Goals: Severity of deficits;Cognitive deficits      Swallow Study   General HPI: Pt is a 83 y.o. male who presents from home to ED on 02/15/17 with L-sided weakness. CT shows intraparenchymal hematoma within R  thalamus extending into R lateral ventricle. Pertinent PMH includes dementia.  Type of Study: Bedside Swallow Evaluation Previous Swallow Assessment: none in chart Diet Prior to this Study: NPO Temperature Spikes Noted: No Respiratory Status: Room air History of Recent Intubation: No Behavior/Cognition: Alert;Cooperative;Confused;Requires cueing Oral Cavity Assessment: Excessive secretions Oral Cavity - Dentition: Missing dentition Patient Positioning: Upright in bed Baseline Vocal Quality: Wet Volitional Cough: Weak Volitional Swallow: Able to elicit    Oral/Motor/Sensory Function Overall Oral Motor/Sensory Function: Moderate impairment (difficult following commands to assess, L weakness noted)   Ice Chips Ice chips: Impaired Presentation: Spoon Oral Phase Impairments: Reduced lingual movement/coordination;Poor awareness of bolus Oral Phase Functional Implications: Left anterior spillage;Oral holding Pharyngeal Phase Impairments: Suspected delayed Swallow;Cough - Delayed   Thin Liquid Thin Liquid: Impaired Presentation: Spoon Oral Phase Impairments: Poor awareness of bolus;Reduced lingual movement/coordination Pharyngeal  Phase Impairments: Cough - Immediate    Nectar Thick Nectar Thick Liquid: Not tested   Honey Thick Honey Thick Liquid: Not tested   Puree Puree: Not tested   Solid   GO   Solid: Not tested        [name] 02/16/2017,4:36 PM  [name], M.A. CCC-SLP [phone]

## 2017-02-16 NOTE — Evaluation (Signed)
Speech Language Pathology Evaluation Patient Details Name: Anthony Fisher MRN: [id number] DOB: 05-06-1926 Today's Date: 02/16/2017 Time: 2637-8588 SLP Time Calculation (min) (ACUTE ONLY): 7 min  Problem List:  Patient Active Problem List   Diagnosis Date Noted  . ICH (intracerebral hemorrhage) ([place]) 02/16/2017   Past Medical History:  Past Medical History:  Diagnosis Date  . Dementia    Past Surgical History: History reviewed. No pertinent surgical history. HPI:  Pt is a 83 y.o. male who presents from home to ED on 02/15/17 with L-sided weakness. CT shows intraparenchymal hematoma within R thalamus extending into R lateral ventricle. Pertinent PMH includes dementia.    Assessment / Plan / Recommendation Clinical Impression  Pt has a severe dysarthria, making communication even with single words and short phrases difficult to understand. He is oriented to person only and needs Mod-Max cues for sustained attention to tasks. Mod cues also provided for completion of one-step commands. Pt will need SLP f/u to maximize functional cognition and communication.    SLP Assessment  SLP Recommendation/Assessment: Patient needs continued Speech Lanaguage Pathology Services SLP Visit Diagnosis: Dysarthria and anarthria (R47.1);Attention and concentration deficit Attention and concentration deficit following: Nontraumatic intracerebral hemorrhage    Follow Up Recommendations  Inpatient Rehab    Frequency and Duration min 2x/week  2 weeks      SLP Evaluation Cognition  Overall Cognitive Status: No family/caregiver present to determine baseline cognitive functioning Arousal/Alertness: Awake/alert Orientation Level: Oriented to person;Disoriented to time;Disoriented to place;Disoriented to situation Attention: Sustained Sustained Attention: Impaired Sustained Attention Impairment: Verbal basic Memory: Impaired Memory Impairment: Decreased recall of new information Awareness:  Impaired Awareness Impairment: Intellectual impairment;Emergent impairment Problem Solving: Impaired Problem Solving Impairment: Functional basic Safety/Judgment: Impaired       Comprehension  Auditory Comprehension Overall Auditory Comprehension: Impaired Commands: Impaired One Step Basic Commands: 50-74% accurate    Expression Expression Primary Mode of Expression: Verbal Verbal Expression Overall Verbal Expression: Other (comment) (difficult to adequately assess given level of dysarthria)   Oral / Motor  Oral Motor/Sensory Function Overall Oral Motor/Sensory Function: Moderate impairment (difficulty following commands, L weakness noted) Motor Speech Overall Motor Speech: Impaired Respiration: Within functional limits Phonation: Wet Articulation: Impaired Level of Impairment: Word Intelligibility: Intelligibility reduced Word: 0-24% accurate   GO                    [name] 02/16/2017, 4:42 PM  [name], M.A. CCC-SLP [phone]

## 2017-02-16 NOTE — Evaluation (Signed)
Physical Therapy Evaluation Patient Details Name: Anthony Fisher MRN: [id number] DOB: [birth year] Today's Date: 02/16/2017   History of Present Illness  Pt is a 83 y.o. male who presents from home to ED on 02/15/17 with L-sided weakness. CT shows intraparenchymal hematoma within R thalamus extending into R lateral ventricle. Pertinent PMH includes dementia.   Clinical Impression  Pt presents to PT with L-side hemiparesis, generalized weakness, decreased awareness, and an overall decrease in functional mobility secondary to above. PTA, pt indep with all functional mobility and amb, living at home with son and his son's family who can provide 24-7 supervision. Today, pt able to stand and pivot to chair, requiring maxA +2. Pt speech dysarthric, but family present and able to answer questions regarding PLOF. Pt would benefit from continued acute PT services to maximize functional mobility and independence.     Follow Up Recommendations CIR    Equipment Recommendations   (Defer to next venue)    Recommendations for Other Services Rehab consult;OT consult     Precautions / Restrictions Precautions Precautions: Fall Restrictions Weight Bearing Restrictions: No      Mobility  Bed Mobility Overal bed mobility: Needs Assistance Bed Mobility: Supine to Sit     Supine to sit: Max assist     General bed mobility comments: MaxA for trunk support and to maneuver LLE out of bed.  Transfers Overall transfer level: Needs assistance Equipment used: None Transfers: Sit to/from [facility] Sit to Stand: +2 physical assistance;Max assist Stand pivot transfers: +2 physical assistance;Max assist          Ambulation/Gait                Stairs            Wheelchair Mobility    Modified Rankin (Stroke Patients Only) Modified Rankin (Stroke Patients Only) Pre-Morbid Rankin Score: No symptoms Modified Rankin: Severe disability     Balance Overall balance  assessment: Needs assistance Sitting-balance support: No upper extremity supported;Bilateral upper extremity supported;Feet supported Sitting balance-Leahy Scale: Zero Sitting balance - Comments: Pt with significant left lateral lean, pushing with RUE, requiring mod-maxA and multimodal cues to achieve upright and maintain seated balance.  Postural control: Left lateral lean   Standing balance-Leahy Scale: Zero Standing balance comment: MaxA +2 for standing balance, with multimodal cues for hip extension and blocking for bilat knee instability.                              Pertinent Vitals/Pain Pain Assessment: No/denies pain    Home Living Family/patient expects to be discharged to:: Private residence Living Arrangements: Children Available Help at Discharge: Family;Available 24 hours/day Type of Home: House Home Access: Stairs to enter Entrance Stairs-Rails: None Entrance Stairs-Number of Steps: 1 Home Layout: One level Home Equipment: None      Prior Function Level of Independence: Independent         Comments: Son reports pt indep with all functional mobility and amb; sedentary lifestyle.      Hand Dominance        Extremity/Trunk Assessment   Upper Extremity Assessment Upper Extremity Assessment: Defer to OT evaluation;LUE deficits/detail;Generalized weakness LUE Deficits / Details: No active movement in LUE, except 1/5 biceps    Lower Extremity Assessment Lower Extremity Assessment: Generalized weakness;LLE deficits/detail;RLE deficits/detail RLE Deficits / Details: RLE grossly 3/5 throughout; lacks full knee ext LLE Deficits / Details: No active movement in LLE; lacks full  knee ext       Communication   Communication: HOH;Expressive difficulties (Dysarthria )  Cognition Arousal/Alertness: Awake/alert Behavior During Therapy: WFL for tasks assessed/performed Overall Cognitive Status: History of cognitive impairments - at baseline Area of  Impairment: Orientation;Attention;Memory;Following commands;Awareness;Problem solving Orientation Level: Disoriented to;Place;Time;Situation Current Attention Level: Sustained Memory: Decreased short-term memory Following Commands: Follows multi-step commands inconsistently;Follows one step commands with increased time   Awareness: Intellectual Problem Solving: Slow processing;Requires verbal cues;Requires tactile cues General Comments: Pt A&O to name, birthday, and names of family members present. Family reports dementia with short term memory deficits at baseline; awareness seemed to improve somewhat when family was present.     General Comments      Exercises     Assessment/Plan    PT Assessment Patient needs continued PT services  PT Problem List Decreased strength;Decreased mobility;Decreased range of motion;Decreased cognition;Decreased activity tolerance;Decreased balance       PT Treatment Interventions Gait training;DME instruction;Therapeutic activities;Therapeutic exercise;Patient/family education;Balance training;Functional mobility training;Neuromuscular re-education;Stair training    PT Goals (Current goals can be found in the Care Plan section)  Acute Rehab PT Goals Patient Stated Goal: Return home PT Goal Formulation: With patient Time For Goal Achievement: 03/02/17 Potential to Achieve Goals: Fair    Frequency Min 4X/week   Barriers to discharge        Co-evaluation               End of Session Equipment Utilized During Treatment: Gait belt Activity Tolerance: Patient tolerated treatment well Patient left: in chair;with chair alarm set;with family/visitor present;with call bell/phone within reach Nurse Communication: Mobility status PT Visit Diagnosis: Muscle weakness (generalized) (M62.81);Hemiplegia and hemiparesis;Other abnormalities of gait and mobility (R26.89) Hemiplegia - Right/Left: Left Hemiplegia - caused by: Other Nontraumatic  intracranial hemorrhage         Time: 4098-1191 PT Time Calculation (min) (ACUTE ONLY): 33 min   Charges:   PT Evaluation $PT Eval Moderate Complexity: 1 Procedure PT Treatments $Therapeutic Activity: 8-22 mins   PT G Codes:       Enis Gash, SPT Office-[phone]  Mabeline Caras 02/16/2017, 2:05 PM

## 2017-02-16 NOTE — Progress Notes (Signed)
STROKE TEAM PROGRESS NOTE   HISTORY OF PRESENT ILLNESS (per record) Anthony Fisher is a 83 y.o. male with a history of mild dementia who presents with left-sided weakness that started earlier today. He apparently was up and around around 8 or 9 AM and was normal at that time (LKW 02/15/2017 at 0800). Then "a couple of hours later" he was noticed to be stumbling, and decided that he wanted to lay down. He was then not seen until 7 PM, when they heard a fall and found him with left-sided weakness. CT showed a R thalamic hemorrhage. ICH Score: 2.  He was admitted to the neuro ICU for further evaluation and treatment.   SUBJECTIVE (INTERVAL HISTORY) Son and daughter are at bedside. Pt drowsy sleepy with severe dysarthria. Open eyes on pain stimulation. Not cooperative on exam.    OBJECTIVE Temp:  [97.5 F (36.4 C)-99.4 F (37.4 C)] 99.4 F (37.4 C) (03/13 0800) Pulse Rate:  [63-82] 67 (03/13 0800) Cardiac Rhythm: Normal sinus rhythm (03/13 0800) Resp:  [14-23] 16 (03/13 0800) BP: (107-184)/(55-94) 126/63 (03/13 0800) SpO2:  [92 %-100 %] 100 % (03/13 0800) Weight:  [81.6 kg (180 lb)-82.2 kg (181 lb 3.5 oz)] 82.2 kg (181 lb 3.5 oz) (03/12 2030)  CBC:  Recent Labs Lab 02/15/17 1924 02/16/17 0804  WBC 5.8 9.2  NEUTROABS 2.6  --   HGB 12.9* 12.8*  HCT 37.5* 37.5*  MCV 96.2 96.6  PLT 206 604    Basic Metabolic Panel:  Recent Labs Lab 02/15/17 1915 02/15/17 1924  NA 142 141  K 3.6 3.6  CL 106 108  CO2  --  23  GLUCOSE 132* 133*  BUN 18 16  CREATININE 1.00 1.08  CALCIUM  --  9.2    Lipid Panel: No results found for: CHOL, TRIG, HDL, CHOLHDL, VLDL, LDLCALC HgbA1c: No results found for: HGBA1C Urine Drug Screen: No results found for: LABOPIA, COCAINSCRNUR, LABBENZ, AMPHETMU, THCU, LABBARB    IMAGING I have personally reviewed the radiological images below and agree with the radiology interpretations.  Dg Chest Port 1 View 02/16/2017 Cardiomegaly. Bibasilar atelectasis.    Ct Head Code Stroke W/o Cm 02/15/2017 1. Intraparenchymal hematoma within the right thalamus extending into the atrium of the right lateral ventricle. The location is most consistent with hypertensive hemorrhage. 2. No midline shift or other significant mass effect. 3. Intraventricular blood products may lead to hydrocephalus. Short interval surveillance is warranted.   CTA head and neck pending   PHYSICAL EXAM  Temp:  [97.5 F (36.4 C)-99.4 F (37.4 C)] 99.4 F (37.4 C) (03/13 0800) Pulse Rate:  [63-82] 64 (03/13 1000) Resp:  [14-23] 14 (03/13 1000) BP: (107-184)/(55-94) 127/63 (03/13 1000) SpO2:  [92 %-100 %] 96 % (03/13 1000) Weight:  [180 lb (81.6 kg)-181 lb 3.5 oz (82.2 kg)] 181 lb 3.5 oz (82.2 kg) (03/12 2030)  General - Well nourished, well developed, drowsy sleepy.  Ophthalmologic - Fundi not visualized due to noncooperative.  Cardiovascular - Regular rate and rhythm.  Neuro - drowsy sleepy, not open eyes on voice but briefly open on pain stimulation. Hard of hearing and not cooperative on exam. Severe dysarthria. Eyes resistant for opening, but PERRL, able to move bilaterally but more right gaze preference. Left facial droop, tongue not able to exam. RUE spontaneous movement and localize to pain, LUE slight withdraw on pain. RLE 3/5 and LLE 2/5 on pain stimulation. B/l babinski positive. DTR 1+. Sensation, coordination and gait not tested.  ASSESSMENT/PLAN [name] is a 83 y.o. male with history of mild dementia presenting with L sided weakness. CT showed a R thalamic hemorrhage  Stroke:  right thalamic hemorrhage with IVH in setting of hypertensive emergency.   Resultant  Drowsy, left hemiparesis  Code Stroke CT R thalamic hemorrhage with extension into R lateral ventricle. No shift or mass effect.  CTA head and neck  Pending  Consider MRI later to rule out CAA  2D Echo  pending   LDL pending   HgbA1c pending  SCDs for VTE prophylaxis  Diet  NPO time specified  No antithrombotic prior to admission  Ongoing aggressive stroke risk factor management  Therapy recommendations:  pending   Disposition:  pending   Hypertensive Emergency  BP 184/94 on arrival in setting of neurologic deficits  SBP goal < 140  Started on cardene for control  Due to high fluid volume, changed to Cleviprex  Dementia  Lives with son at home  Memory difficulty  Able to do ADLs  Other Stroke Risk Factors  Advanced age  UDS pending   Other Active Problems  Hypokalemia - supplement    Hospital day # 1  This patient is critically ill due to right BG ICH with IVH, hypertensive emergency, dementia and at significant risk of neurological worsening, death form hematoma expansion, heart failure, seizure. This patient's care requires constant monitoring of vital signs, hemodynamics, respiratory and cardiac monitoring, review of multiple databases, neurological assessment, discussion with family, other specialists and medical decision making of high complexity. I spent 40 minutes of neurocritical care time in the care of this patient. I had long discussion with son and daughter at bedside regarding current diagnosis, treatment plan and potential complications as well as disposition.   Rosalin Hawking, MD PhD Stroke Neurology 02/16/2017 10:37 AM   To contact Stroke Continuity provider, please refer to [email or web address] After hours, contact General Neurology

## 2017-02-16 NOTE — Consult Note (Signed)
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 right handed male with history of mild dementia. Per chart review patient lives with son who works during the day. Independent prior to admission. One level home with one-step entry. Presented 02/15/2017 with left-sided weakness and slurred speech after being found down by family. Systolic blood pressure 157W. CT of the head showed intraparenchymal hematoma within the right thalamus extending into the atrium of the right lateral ventricle. No midline shift or significant mass effect. CT of the head showed no significant stenosis or aneurysm. CTA of the neck no significant stenosis of either carotid artery. Echocardiogram pending. Maintained on cleviprex for blood pressure control. Currently NPO and await swallow study. Physical therapy evaluation completed 02/16/2017 with recommendations of physical medicine rehabilitation consult.   Review of Systems  Constitutional: Negative for chills and fever.  HENT: Positive for hearing loss. Negative for tinnitus.   Eyes: Negative for blurred vision and double vision.  Respiratory: Negative for cough and shortness of breath.   Cardiovascular: Positive for leg swelling. Negative for chest pain and palpitations.  Gastrointestinal: Positive for constipation. Negative for nausea and vomiting.  Genitourinary: Positive for urgency. Negative for dysuria and hematuria.  Musculoskeletal: Positive for falls.  Skin: Negative for rash.  Neurological: Positive for weakness. Negative for seizures.  Psychiatric/Behavioral: Positive for memory loss.  All other systems reviewed and are negative.  Past Medical History:  Diagnosis Date  . Dementia    History reviewed. No pertinent surgical history. History reviewed. No pertinent family history. Social History:   reports that he has never smoked. He has never used smokeless tobacco. He reports that he does not drink alcohol or use drugs. Allergies:  Allergies  Allergen Reactions  . Tape Other (See Comments)    SKIN IS THIN (MAY TEAR AND/OR BRUISE EASILY)   Medications Prior to Admission  Medication Sig Dispense Refill  . polyethylene glycol powder (GLYCOLAX/MIRALAX) powder Take 17 g by mouth daily.      Home: Home Living Family/patient expects to be discharged to:: Private residence Living Arrangements: Children Available Help at Discharge: Family, Available 24 hours/day Type of Home: House Home Access: Stairs to enter [employer] of Steps: 1 Entrance Stairs-Rails: None Home Layout: One level Home Equipment: None  Functional History: Prior Function Level of Independence: Independent Comments: Son reports pt indep with all functional mobility and amb; sedentary lifestyle.  Functional Status:  Mobility: Bed Mobility Overal bed mobility: Needs Assistance Bed Mobility: Supine to Sit Supine to sit: Max assist General bed mobility comments: MaxA for trunk support and to maneuver LLE out of bed. Transfers Overall transfer level: Needs assistance Equipment used: None Transfers: Sit to/from Stand, Stand Pivot Transfers Sit to Stand: +2 physical assistance, Max assist Stand pivot transfers: +2 physical assistance, Max assist      ADL:    Cognition: Cognition Overall Cognitive Status: History of cognitive impairments - at baseline Orientation Level: Oriented to person, Oriented to place Cognition Arousal/Alertness: Awake/alert Behavior During Therapy: WFL for tasks assessed/performed Overall Cognitive Status: History of cognitive impairments - at baseline Area of Impairment: Orientation, Attention, Memory, Following commands, Awareness, Problem solving Orientation Level: Disoriented to, Place, Time, Situation Current Attention Level: Sustained Memory: Decreased  short-term memory Following Commands: Follows multi-step commands inconsistently, Follows one step commands with increased time Awareness: Intellectual Problem Solving: Slow processing, Requires verbal  cues, Requires tactile cues General Comments: Pt A&O to name, birthday, and names of family members present. Family reports dementia with short term memory deficits at baseline; awareness seemed to improve somewhat when family was present.   Blood pressure (!) 119/59, pulse 64, temperature 98.1 F (36.7 C), temperature source Oral, resp. rate 20, height 5\' 9"  (1.753 m), weight 82.2 kg (181 lb 3.5 oz), SpO2 95 %. Physical Exam  HENT:  Head: Normocephalic.  Oral mucosa white with caked on debris along tongue as well  Eyes: EOM are normal.  Neck: Normal range of motion. Neck supple. No thyromegaly present.  Cardiovascular: Normal rate and regular rhythm.   Respiratory: Effort normal and breath sounds normal. No respiratory distress.  GI: Soft. Bowel sounds are normal. He exhibits no distension.  Neurological: He exhibits abnormal muscle tone.  Lethargic but arousable. Speech is very dysarthric to the point that he is generally unintelligible. Left facial weakness. He can provide his name and age. Identified my two fingers. Had difficulties following simple one step commands. LUE 0/5. LLE grossly 1 to 1+/5. No pain sense LUE and perhaps mild pain sense/withdrawal in the LLE. Moves rue and rle grossly 3-4/5.   Skin: Skin is warm and dry.    Results for orders placed or performed during the hospital encounter of 02/15/17 (from the past 24 hour(s))  CBG monitoring, ED     Status: Abnormal   Collection Time: 02/15/17  7:08 PM  Result Value Ref Range   Glucose-Capillary 124 (H) 65 - 99 mg/dL  I-stat troponin, ED (not at [facility], [facility])     Status: None   Collection Time: 02/15/17  7:13 PM  Result Value Ref Range   Troponin i, poc 0.01 0.00 - 0.08 ng/mL   Comment 3          I-Stat Chem 8, ED  (not at  [facility], [facility])     Status: Abnormal   Collection Time: 02/15/17  7:15 PM  Result Value Ref Range   Sodium 142 135 - 145 mmol/L   Potassium 3.6 3.5 - 5.1 mmol/L   Chloride 106 101 - 111 mmol/L   BUN 18 6 - 20 mg/dL   Creatinine, Ser 1.00 0.61 - 1.24 mg/dL   Glucose, Bld 132 (H) 65 - 99 mg/dL   Calcium, Ion 1.14 (L) 1.15 - 1.40 mmol/L   TCO2 25 0 - 100 mmol/L   Hemoglobin 11.9 (L) 13.0 - 17.0 g/dL   HCT 35.0 (L) 39.0 - 52.0 %  Ethanol     Status: None   Collection Time: 02/15/17  7:24 PM  Result Value Ref Range   Alcohol, Ethyl (B) <5 <5 mg/dL  Protime-INR     Status: None   Collection Time: 02/15/17  7:24 PM  Result Value Ref Range   Prothrombin Time 13.9 11.4 - 15.2 seconds   INR 1.07   APTT     Status: None   Collection Time: 02/15/17  7:24 PM  Result Value Ref Range   aPTT 27 24 - 36 seconds  CBC     Status: Abnormal   Collection Time: 02/15/17  7:24 PM  Result Value Ref Range   WBC 5.8 4.0 - 10.5 K/uL   RBC 3.90 (L) 4.22 - 5.81 MIL/uL   Hemoglobin 12.9 (L) 13.0 - 17.0 g/dL   HCT 37.5 (L) 39.0 - 52.0 %   MCV 96.2 78.0 - 100.0 fL   MCH 33.1 26.0 - 34.0 pg   MCHC 34.4 30.0 -  36.0 g/dL   RDW 12.8 11.5 - 15.5 %   Platelets 206 150 - 400 K/uL  Differential     Status: None   Collection Time: 02/15/17  7:24 PM  Result Value Ref Range   Neutrophils Relative % 44 %   Neutro Abs 2.6 1.7 - 7.7 K/uL   Lymphocytes Relative 39 %   Lymphs Abs 2.3 0.7 - 4.0 K/uL   Monocytes Relative 13 %   Monocytes Absolute 0.8 0.1 - 1.0 K/uL   Eosinophils Relative 3 %   Eosinophils Absolute 0.2 0.0 - 0.7 K/uL   Basophils Relative 1 %   Basophils Absolute 0.1 0.0 - 0.1 K/uL  Comprehensive metabolic panel     Status: Abnormal   Collection Time: 02/15/17  7:24 PM  Result Value Ref Range   Sodium 141 135 - 145 mmol/L   Potassium 3.6 3.5 - 5.1 mmol/L   Chloride 108 101 - 111 mmol/L   CO2 23 22 - 32 mmol/L   Glucose, Bld 133 (H) 65 - 99 mg/dL   BUN 16 6 - 20 mg/dL   Creatinine, Ser 1.08 0.61 -  1.24 mg/dL   Calcium 9.2 8.9 - 10.3 mg/dL   Total Protein 6.2 (L) 6.5 - 8.1 g/dL   Albumin 3.9 3.5 - 5.0 g/dL   AST 18 15 - 41 U/L   ALT 8 (L) 17 - 63 U/L   Alkaline Phosphatase 63 38 - 126 U/L   Total Bilirubin 0.6 0.3 - 1.2 mg/dL   GFR calc non Af Amer 59 (L) >60 mL/min   GFR calc Af Amer >60 >60 mL/min   Anion gap 10 5 - 15  MRSA PCR Screening     Status: None   Collection Time: 02/15/17  8:30 PM  Result Value Ref Range   MRSA by PCR NEGATIVE NEGATIVE  CBC     Status: Abnormal   Collection Time: 02/16/17  8:04 AM  Result Value Ref Range   WBC 9.2 4.0 - 10.5 K/uL   RBC 3.88 (L) 4.22 - 5.81 MIL/uL   Hemoglobin 12.8 (L) 13.0 - 17.0 g/dL   HCT 37.5 (L) 39.0 - 52.0 %   MCV 96.6 78.0 - 100.0 fL   MCH 33.0 26.0 - 34.0 pg   MCHC 34.1 30.0 - 36.0 g/dL   RDW 13.1 11.5 - 15.5 %   Platelets 199 150 - 400 K/uL  Basic metabolic panel     Status: Abnormal   Collection Time: 02/16/17  8:04 AM  Result Value Ref Range   Sodium 140 135 - 145 mmol/L   Potassium 3.3 (L) 3.5 - 5.1 mmol/L   Chloride 110 101 - 111 mmol/L   CO2 22 22 - 32 mmol/L   Glucose, Bld 129 (H) 65 - 99 mg/dL   BUN 15 6 - 20 mg/dL   Creatinine, Ser 0.99 0.61 - 1.24 mg/dL   Calcium 8.5 (L) 8.9 - 10.3 mg/dL   GFR calc non Af Amer >60 >60 mL/min   GFR calc Af Amer >60 >60 mL/min   Anion gap 8 5 - 15  Vitamin B12     Status: None   Collection Time: 02/16/17  8:04 AM  Result Value Ref Range   Vitamin B-12 277 180 - 914 pg/mL  Lipid panel     Status: None   Collection Time: 02/16/17  8:04 AM  Result Value Ref Range   Cholesterol 131 0 - 200 mg/dL   Triglycerides 69 <150 mg/dL  HDL 47 >40 mg/dL   Total CHOL/HDL Ratio 2.8 RATIO   VLDL 14 0 - 40 mg/dL   LDL Cholesterol 70 0 - 99 mg/dL  TSH     Status: None   Collection Time: 02/16/17  8:04 AM  Result Value Ref Range   TSH 1.063 0.350 - 4.500 uIU/mL   Ct Angio Head W Or Wo Contrast  Result Date: 02/16/2017 CLINICAL DATA:  83 year old male with intracranial  hemorrhage. Dementia. Subsequent encounter. EXAM: CT ANGIOGRAPHY HEAD AND NECK TECHNIQUE: Multidetector CT imaging of the head and neck was performed using the standard protocol during bolus administration of intravenous contrast. Multiplanar CT image reconstructions and MIPs were obtained to evaluate the vascular anatomy. Carotid stenosis measurements (when applicable) are obtained utilizing NASCET criteria, using the distal internal carotid diameter as the denominator. CONTRAST:  50 cc Isovue 370. COMPARISON:  None. FINDINGS: CT HEAD FINDINGS Brain: 2.8 x 2 x 2.8 cm right thalamic hemorrhage with overall similar dimensions to the recent exam. Slight increase in degree of surrounding vasogenic edema. Mild mass effect with compression right lateral aspect of the third ventricle. Breakthrough of hemorrhage into right lateral ventricle as previously noted. Tiny amount of intraventricular blood now also noted within the dependent aspect of the left lateral ventricle. No intracranial enhancing lesion seen separate from the thalamic hemorrhage. No acute thrombotic infarct. Global atrophy without hydrocephalus. Right temporal horn slightly larger than the left unchanged from 11/12/2016. Chronic microvascular changes. Vascular: As below. Skull: Negative. Sinuses: Minimal mucosal thickening right maxillary sinus. Orbits: Post lens replacement. Minimal exophthalmos. No acute orbital abnormality. Review of the MIP images confirms the above findings CTA NECK FINDINGS Aortic arch: 3 vessel arch with calcified plaque. Right carotid system: Tortuous right carotid artery with medial displacement. No hemodynamically significant stenosis of the right common carotid artery or right internal carotid artery. Ectatic distal cervical segment of the right internal carotid artery. Left carotid system: No significant abnormality left common carotid artery. No significant stenosis of the left internal carotid artery. Medial displacement left  internal carotid artery. Mild fold at the level of maximal medial displacement. Vertebral arteries: Plaques with mild to slightly moderate narrowing proximal right subclavian artery. Minimal plaque origin right vertebral artery without significant narrowing. Right vertebral artery is dominant. Minimal plaque origin non dominant left vertebral artery without significant narrowing. Skeleton: Cervical spondylotic changes most notable C6-7.  Caries. Other neck: No worrisome mass. Upper chest: Mild dependent atelectasis. Review of the MIP images confirms the above findings CTA HEAD FINDINGS Anterior circulation: Calcified plaque with mild narrowing cavernous segment internal carotid artery bilaterally. No abnormal vessels extend to the right thalamic hemorrhage. No significant stenosis carotid terminus, A1 segment or M1 segment of the anterior cerebral artery middle cerebral artery on either side. No aneurysm or vascular malformation noted. Posterior circulation: Right vertebral artery is dominant with minimal plaque and narrowing. Left vertebral artery predominantly ends in a posterior inferior cerebellar artery distribution with moderate narrowing at the level of the takeoff of the left posterior inferior cerebellar artery. Ectatic basilar artery without significant stenosis. Venous sinuses: Patent. Anatomic variants: None Delayed phase: As above. Review of the MIP images confirms the above findings IMPRESSION: CT HEAD 2.8 x 2 x 2.8 cm right thalamic hemorrhage with overall similar dimensions to the recent exam. Slight increase in degree of surrounding vasogenic edema. Mild mass effect with compression right lateral aspect of the third ventricle. Breakthrough of hemorrhage into right lateral ventricle as previously noted. Tiny amount of intraventricular  blood now also noted within the dependent aspect of the left lateral ventricle. CTA NECK Carotid arteries are tortuous with medial displacement of internal carotid  arteries bilaterally. No evidence of hemodynamically significant stenosis of either carotid artery. Mild to slightly moderate narrowing proximal right subclavian artery. Right vertebral artery is dominant. CTA HEAD Calcified plaque with mild narrowing cavernous segment internal carotid artery bilaterally. No significant stenosis carotid terminus, A1 segment or M1 segment of the anterior cerebral artery or middle cerebral artery on either side. No aneurysm or vascular malformation noted. Right vertebral artery is dominant with minimal plaque and narrowing. Left vertebral artery predominantly ends in a posterior inferior cerebellar artery distribution with moderate narrowing at the level of the takeoff of the left posterior inferior cerebellar artery. Electronically Signed   By: Genia Del M.D.   On: 02/16/2017 10:42   Ct Angio Neck W Or Wo Contrast  Result Date: 02/16/2017 CLINICAL DATA:  83 year old male with intracranial hemorrhage. Dementia. Subsequent encounter. EXAM: CT ANGIOGRAPHY HEAD AND NECK TECHNIQUE: Multidetector CT imaging of the head and neck was performed using the standard protocol during bolus administration of intravenous contrast. Multiplanar CT image reconstructions and MIPs were obtained to evaluate the vascular anatomy. Carotid stenosis measurements (when applicable) are obtained utilizing NASCET criteria, using the distal internal carotid diameter as the denominator. CONTRAST:  50 cc Isovue 370. COMPARISON:  None. FINDINGS: CT HEAD FINDINGS Brain: 2.8 x 2 x 2.8 cm right thalamic hemorrhage with overall similar dimensions to the recent exam. Slight increase in degree of surrounding vasogenic edema. Mild mass effect with compression right lateral aspect of the third ventricle. Breakthrough of hemorrhage into right lateral ventricle as previously noted. Tiny amount of intraventricular blood now also noted within the dependent aspect of the left lateral ventricle. No intracranial enhancing  lesion seen separate from the thalamic hemorrhage. No acute thrombotic infarct. Global atrophy without hydrocephalus. Right temporal horn slightly larger than the left unchanged from 11/12/2016. Chronic microvascular changes. Vascular: As below. Skull: Negative. Sinuses: Minimal mucosal thickening right maxillary sinus. Orbits: Post lens replacement. Minimal exophthalmos. No acute orbital abnormality. Review of the MIP images confirms the above findings CTA NECK FINDINGS Aortic arch: 3 vessel arch with calcified plaque. Right carotid system: Tortuous right carotid artery with medial displacement. No hemodynamically significant stenosis of the right common carotid artery or right internal carotid artery. Ectatic distal cervical segment of the right internal carotid artery. Left carotid system: No significant abnormality left common carotid artery. No significant stenosis of the left internal carotid artery. Medial displacement left internal carotid artery. Mild fold at the level of maximal medial displacement. Vertebral arteries: Plaques with mild to slightly moderate narrowing proximal right subclavian artery. Minimal plaque origin right vertebral artery without significant narrowing. Right vertebral artery is dominant. Minimal plaque origin non dominant left vertebral artery without significant narrowing. Skeleton: Cervical spondylotic changes most notable C6-7.  Caries. Other neck: No worrisome mass. Upper chest: Mild dependent atelectasis. Review of the MIP images confirms the above findings CTA HEAD FINDINGS Anterior circulation: Calcified plaque with mild narrowing cavernous segment internal carotid artery bilaterally. No abnormal vessels extend to the right thalamic hemorrhage. No significant stenosis carotid terminus, A1 segment or M1 segment of the anterior cerebral artery middle cerebral artery on either side. No aneurysm or vascular malformation noted. Posterior circulation: Right vertebral artery is  dominant with minimal plaque and narrowing. Left vertebral artery predominantly ends in a posterior inferior cerebellar artery distribution with moderate narrowing at the level of  the takeoff of the left posterior inferior cerebellar artery. Ectatic basilar artery without significant stenosis. Venous sinuses: Patent. Anatomic variants: None Delayed phase: As above. Review of the MIP images confirms the above findings IMPRESSION: CT HEAD 2.8 x 2 x 2.8 cm right thalamic hemorrhage with overall similar dimensions to the recent exam. Slight increase in degree of surrounding vasogenic edema. Mild mass effect with compression right lateral aspect of the third ventricle. Breakthrough of hemorrhage into right lateral ventricle as previously noted. Tiny amount of intraventricular blood now also noted within the dependent aspect of the left lateral ventricle. CTA NECK Carotid arteries are tortuous with medial displacement of internal carotid arteries bilaterally. No evidence of hemodynamically significant stenosis of either carotid artery. Mild to slightly moderate narrowing proximal right subclavian artery. Right vertebral artery is dominant. CTA HEAD Calcified plaque with mild narrowing cavernous segment internal carotid artery bilaterally. No significant stenosis carotid terminus, A1 segment or M1 segment of the anterior cerebral artery or middle cerebral artery on either side. No aneurysm or vascular malformation noted. Right vertebral artery is dominant with minimal plaque and narrowing. Left vertebral artery predominantly ends in a posterior inferior cerebellar artery distribution with moderate narrowing at the level of the takeoff of the left posterior inferior cerebellar artery. Electronically Signed   By: Genia Del M.D.   On: 02/16/2017 10:42   Dg Chest Port 1 View  Result Date: 02/16/2017 CLINICAL DATA:  Stroke, cough. EXAM: PORTABLE CHEST 1 VIEW COMPARISON:  05/09/2009 FINDINGS: Mild cardiomegaly. Low  volumes. Bibasilar atelectasis. No definite pleural effusion. No pneumothorax. Normal vascularity. IMPRESSION: Cardiomegaly. Bibasilar atelectasis. Electronically Signed   By: Marybelle Killings M.D.   On: 02/16/2017 07:28   Ct Head Code Stroke W/o Cm  Result Date: 02/15/2017 CLINICAL DATA:  Code stroke.  Unresponsive patient EXAM: CT HEAD WITHOUT CONTRAST TECHNIQUE: Contiguous axial images were obtained from the base of the skull through the vertex without intravenous contrast. COMPARISON:  Head CT 11/12/2016 FINDINGS: Brain: There is an intraparenchymal hematoma within the right thalamus extending into the right lateral ventricle atrium. The hematoma measures 2.0 x 2.8 x 2.8 cm. The CSF spaces remain widely patent without hydrocephalus. There is no midline shift or significant mass effect. Vascular: Atherosclerotic calcification of the vertebral and internal carotid arteries at the skull base. Skull: Normal Sinuses/Orbits: Mild bilateral maxillary mucosal thickening. Normal orbits. Other: None IMPRESSION: 1. Intraparenchymal hematoma within the right thalamus extending into the atrium of the right lateral ventricle. The location is most consistent with hypertensive hemorrhage. 2. No midline shift or other significant mass effect. 3. Intraventricular blood products may lead to hydrocephalus. Short interval surveillance is warranted. Critical Value/emergent results were called by telephone at the time of interpretation on 02/15/2017 at 7:24 pm to Dr. Veryl Speak , who verbally acknowledged these results. Electronically Signed   By: Ulyses Jarred M.D.   On: 02/15/2017 19:28    Assessment/Plan: Diagnosis:  Left hemiparesis and hemisensory deficits as well as profound communication and swallowing dysfunction due to right thalamic ICH 1. Does the need for close, 24 hr/day medical supervision in concert with the patient's rehab needs make it unreasonable for this patient to be served in a less intensive setting?  Yes 2. Co-Morbidities requiring supervision/potential complications: mild dementia 3. Due to bladder management, bowel management, safety, skin/wound care, disease management, medication administration, pain management and patient education, does the patient require 24 hr/day rehab nursing? Yes 4. Does the patient require coordinated care of a physician, rehab nurse,  PT (1-2 hrs/day, 5 days/week), OT (1-2 hrs/day, 5 days/week) and SLP (1-2 hrs/day, 5 days/week) to address physical and functional deficits in the context of the above medical diagnosis(es)? Yes Addressing deficits in the following areas: balance, endurance, locomotion, strength, transferring, bowel/bladder control, bathing, dressing, feeding, grooming, toileting, cognition, speech, language, swallowing and psychosocial support 5. Can the patient actively participate in an intensive therapy program of at least 3 hrs of therapy per day at least 5 days per week? Potentially 6. The potential for patient to make measurable gains while on inpatient rehab is good 7. Anticipated functional outcomes upon discharge from inpatient rehab are min assist and mod assist  with PT, min assist and mod assist with OT, min assist and mod assist with SLP. 8. Estimated rehab length of stay to reach the above functional goals is: 14-17 days 9. Does the patient have adequate social supports and living environment to accommodate these discharge functional goals? Potentially 10. Anticipated D/C setting: Other 11. Anticipated post D/C treatments: N/A 12. Overall Rehab/Functional Prognosis: good  RECOMMENDATIONS: This patient's condition is appropriate for continued rehabilitative care in the following setting: CIR Patient has agreed to participate in recommended program. N/A Note that insurance prior authorization may be required for reimbursement for recommended care.  Comment: I don't believe this patient's social supports can meet his expected discharge  needs from inpatient rehab. However, it would be beneficial to improve patient's swallowing, communication, and functional mobility to decrease his burden of care at the next level of care. He is at high risk for aspiration event and nutritional deficits given his neurological condition and age. Rehab Admissions Coordinator to follow up.  Thanks,  Meredith Staggers, MD, Mellody Drown    Cathlyn Parsons., PA-C 02/16/2017

## 2017-02-16 NOTE — Care Management Note (Signed)
Case Management Note  Patient Details  Name: Anthony Fisher MRN: [id number] Date of Birth: [birth year]  Subjective/Objective:  Pt admitted on 02/15/17 s/p intraparenchymal hematoma within Rt thalamus extending into Rt lateral ventricle.  PTA, pt resides at home with adult children.                    Action/Plan: Will follow for discharge planning as pt progresses.    Expected Discharge Date:                  Expected Discharge Plan:  [place]  In-House Referral:     Discharge planning Services  CM Consult  Post Acute Care Choice:    Choice offered to:     DME Arranged:    DME Agency:     HH Arranged:    [place] Agency:     Status of Service:  In process, will continue to follow  If discussed at Long Length of Stay Meetings, dates discussed:    Additional Comments:  [name], RN, BSN  Trauma/Neuro ICU Case Manager [phone]

## 2017-02-16 NOTE — Progress Notes (Signed)
OT Cancellation Note  Patient Details Name: Anthony Fisher MRN: [id number] DOB: December 24, 1925   Cancelled Treatment:    Reason Eval/Treat Not Completed: Patient not medically ready. Active bedrest orders. Please update activity orders when appropriate for therapy. Thanks  [place], OT/L  [phone] 02/16/2017 02/16/2017, 7:25 AM

## 2017-02-17 ENCOUNTER — Inpatient Hospital Stay (HOSPITAL_COMMUNITY): Payer: Medicare Other

## 2017-02-17 DIAGNOSIS — R339 Retention of urine, unspecified: Secondary | ICD-10-CM

## 2017-02-17 DIAGNOSIS — I10 Essential (primary) hypertension: Secondary | ICD-10-CM

## 2017-02-17 DIAGNOSIS — R1312 Dysphagia, oropharyngeal phase: Secondary | ICD-10-CM

## 2017-02-17 LAB — BASIC METABOLIC PANEL
ANION GAP: 10 (ref 5–15)
BUN: 10 mg/dL (ref 6–20)
CALCIUM: 8.3 mg/dL — AB (ref 8.9–10.3)
CO2: 20 mmol/L — AB (ref 22–32)
Chloride: 108 mmol/L (ref 101–111)
Creatinine, Ser: 0.79 mg/dL (ref 0.61–1.24)
Glucose, Bld: 104 mg/dL — ABNORMAL HIGH (ref 65–99)
Potassium: 3.4 mmol/L — ABNORMAL LOW (ref 3.5–5.1)
Sodium: 138 mmol/L (ref 135–145)

## 2017-02-17 LAB — HEMOGLOBIN A1C
Hgb A1c MFr Bld: 5.5 % (ref 4.8–5.6)
MEAN PLASMA GLUCOSE: 111 mg/dL

## 2017-02-17 LAB — CBC
HEMATOCRIT: 36.4 % — AB (ref 39.0–52.0)
Hemoglobin: 12.5 g/dL — ABNORMAL LOW (ref 13.0–17.0)
MCH: 33.3 pg (ref 26.0–34.0)
MCHC: 34.3 g/dL (ref 30.0–36.0)
MCV: 97.1 fL (ref 78.0–100.0)
Platelets: 190 10*3/uL (ref 150–400)
RBC: 3.75 MIL/uL — ABNORMAL LOW (ref 4.22–5.81)
RDW: 13.2 % (ref 11.5–15.5)
WBC: 9.8 10*3/uL (ref 4.0–10.5)

## 2017-02-17 LAB — MAGNESIUM: Magnesium: 1.9 mg/dL (ref 1.7–2.4)

## 2017-02-17 LAB — GLUCOSE, CAPILLARY
GLUCOSE-CAPILLARY: 107 mg/dL — AB (ref 65–99)
GLUCOSE-CAPILLARY: 114 mg/dL — AB (ref 65–99)

## 2017-02-17 LAB — PHOSPHORUS: Phosphorus: 2.3 mg/dL — ABNORMAL LOW (ref 2.5–4.6)

## 2017-02-17 MED ORDER — POLYETHYLENE GLYCOL 3350 17 GM/SCOOP PO POWD
17.0000 g | Freq: Every day | ORAL | Status: DC
  Administered 2017-02-18: 17 g via ORAL
  Filled 2017-02-17: qty 255

## 2017-02-17 MED ORDER — VITAL HIGH PROTEIN PO LIQD
1000.0000 mL | ORAL | Status: DC
  Administered 2017-02-17 – 2017-02-21 (×5): 1000 mL
  Filled 2017-02-17 (×4): qty 1000

## 2017-02-17 MED ORDER — ORAL CARE MOUTH RINSE
15.0000 mL | Freq: Two times a day (BID) | OROMUCOSAL | Status: DC
  Administered 2017-02-17 – 2017-02-22 (×10): 15 mL via OROMUCOSAL

## 2017-02-17 MED ORDER — ADULT MULTIVITAMIN LIQUID CH
15.0000 mL | Freq: Every day | ORAL | Status: DC
Start: 2017-02-17 — End: 2017-02-23
  Administered 2017-02-17 – 2017-02-23 (×6): 15 mL
  Filled 2017-02-17 (×7): qty 15

## 2017-02-17 MED ORDER — CHLORHEXIDINE GLUCONATE 0.12 % MT SOLN
15.0000 mL | Freq: Two times a day (BID) | OROMUCOSAL | Status: DC
  Administered 2017-02-17 – 2017-02-23 (×13): 15 mL via OROMUCOSAL
  Filled 2017-02-17 (×11): qty 15

## 2017-02-17 MED ORDER — HEPARIN SODIUM (PORCINE) 5000 UNIT/ML IJ SOLN: 5000.0000 [IU] | Freq: Two times a day (BID) | INTRAMUSCULAR | Status: DC

## 2017-02-17 MED ORDER — HEPARIN SODIUM (PORCINE) 5000 UNIT/ML IJ SOLN
5000.0000 [IU] | Freq: Three times a day (TID) | INTRAMUSCULAR | Status: DC
  Administered 2017-02-17 – 2017-02-23 (×18): 5000 [IU] via SUBCUTANEOUS
  Filled 2017-02-17 (×18): qty 1

## 2017-02-17 NOTE — Progress Notes (Signed)
  Speech Language Pathology Treatment: Dysphagia  Patient Details Name: Anthony Fisher MRN: [id number] DOB: 1926/11/01 Today's Date: 02/17/2017 Time: 1410-1430 SLP Time Calculation (min) (ACUTE ONLY): 20 min  Assessment / Plan / Recommendation Clinical Impression  Pt seen at bedside for po trials and cog/com tx. Pt was seated in recliner, sleeping soundly. Difficult to arouse, and required constant verbal and tactile stim to maintain alertness and attention to task. Pt was given ice chips, with poor awareness of bolus, minimal manipulation of bolus, and delayed swallow noted. Initial ice chip spilled anteriorly out of pt's mouth. Second ice chip was held slightly longer, but minimal oral manipulation was still observed. Delayed cough noted as well, and remainder of ice chip and water was suctioned from pt oral cavity. Small boluses of magic cup were also presented, with the majority suctioned from pt mouth after allowing sufficient time. Pt vocal quality was low in intensity, but clear (not wet). Pt able to answer questions, and with context known, responses were intelligible. Pt appeared quite lethargic today, and continues inappropriate for po intake. Recommend consideration of non-oral feeding method. ST to continue to follow for po trials and cog/com treatment. RN notified.   HPI HPI: Pt is a 83 y.o. male who presents from home to ED on 02/15/17 with L-sided weakness. CT shows intraparenchymal hematoma within R thalamus extending into R lateral ventricle. Pertinent PMH includes dementia.       SLP Plan  Continue with current plan of care       Recommendations  Diet recommendations: NPO Medication Administration: Via alternative means                General recommendations: Rehab consult Oral Care Recommendations: Oral care QID Follow up Recommendations: Inpatient Rehab SLP Visit Diagnosis: Dysarthria and anarthria (R47.1);Attention and concentration deficit Attention and  concentration deficit following: Nontraumatic intracerebral hemorrhage Plan: Continue with current plan of care       Gretel Cantu B. Quentin Ore [facility], CCC-SLP [phone]  Shonna Chock 02/17/2017, 2:31 PM

## 2017-02-17 NOTE — Progress Notes (Signed)
STROKE TEAM PROGRESS NOTE   HISTORY OF PRESENT ILLNESS (per record) Anthony Fisher is a 83 y.o. male with a history of mild dementia who presents with left-sided weakness that started earlier today. He apparently was up and around around 8 or 9 AM and was normal at that time (LKW 02/15/2017 at 0800). Then "a couple of hours later" he was noticed to be stumbling, and decided that he wanted to lay down. He was then not seen until 7 PM, when they heard a fall and found him with left-sided weakness. CT showed a R thalamic hemorrhage. ICH Score: 2.  He was admitted to the neuro ICU for further evaluation and treatment.   SUBJECTIVE (INTERVAL HISTORY) Son and daughter are at bedside. Pt more awake alert but still has severe dysarthria. Open eyes on voice. Had CTA head and neck showed stable hematoma. No aneurysm or AVM. Still NPO and pending speech further evaluation today. Has urinary retention and needs foley catheter.    OBJECTIVE Temp:  [97.9 F (36.6 C)-99.1 F (37.3 C)] 98.3 F (36.8 C) (03/14 1200) Pulse Rate:  [61-81] 73 (03/14 1200) Cardiac Rhythm: Normal sinus rhythm (03/14 0800) Resp:  [13-25] 17 (03/14 1200) BP: (116-155)/(54-119) 147/81 (03/14 1200) SpO2:  [92 %-100 %] 100 % (03/14 1200)  CBC:   Recent Labs Lab 02/15/17 1924 02/16/17 0804 02/17/17 0310  WBC 5.8 9.2 9.8  NEUTROABS 2.6  --   --   HGB 12.9* 12.8* 12.5*  HCT 37.5* 37.5* 36.4*  MCV 96.2 96.6 97.1  PLT 206 199 130    Basic Metabolic Panel:   Recent Labs Lab 02/16/17 0804 02/17/17 0310  NA 140 138  K 3.3* 3.4*  CL 110 108  CO2 22 20*  GLUCOSE 129* 104*  BUN 15 10  CREATININE 0.99 0.79  CALCIUM 8.5* 8.3*    Lipid Panel:     Component Value Date/Time   CHOL 131 02/16/2017 0804   TRIG 69 02/16/2017 0804   HDL 47 02/16/2017 0804   CHOLHDL 2.8 02/16/2017 0804   VLDL 14 02/16/2017 0804   LDLCALC 70 02/16/2017 0804   HgbA1c:  Lab Results  Component Value Date   HGBA1C 5.5 02/16/2017    Urine Drug Screen: No results found for: LABOPIA, COCAINSCRNUR, LABBENZ, AMPHETMU, THCU, LABBARB    IMAGING I have personally reviewed the radiological images below and agree with the radiology interpretations.  Dg Chest Port 1 View 02/16/2017 Cardiomegaly. Bibasilar atelectasis.   Ct Head Code Stroke W/o Cm 02/15/2017 1. Intraparenchymal hematoma within the right thalamus extending into the atrium of the right lateral ventricle. The location is most consistent with hypertensive hemorrhage. 2. No midline shift or other significant mass effect. 3. Intraventricular blood products may lead to hydrocephalus. Short interval surveillance is warranted.   Ct Angio Head W Or Wo Contrast 02/16/2017 IMPRESSION: CT HEAD 2.8 x 2 x 2.8 cm right thalamic hemorrhage with overall similar dimensions to the recent exam. Slight increase in degree of surrounding vasogenic edema. Mild mass effect with compression right lateral aspect of the third ventricle. Breakthrough of hemorrhage into right lateral ventricle as previously noted. Tiny amount of intraventricular blood now also noted within the dependent aspect of the left lateral ventricle. CTA NECK Carotid arteries are tortuous with medial displacement of internal carotid arteries bilaterally. No evidence of hemodynamically significant stenosis of either carotid artery. Mild to slightly moderate narrowing proximal right subclavian artery. Right vertebral artery is dominant. CTA HEAD Calcified plaque with mild narrowing cavernous  segment internal carotid artery bilaterally. No significant stenosis carotid terminus, A1 segment or M1 segment of the anterior cerebral artery or middle cerebral artery on either side. No aneurysm or vascular malformation noted. Right vertebral artery is dominant with minimal plaque and narrowing. Left vertebral artery predominantly ends in a posterior inferior cerebellar artery distribution with moderate narrowing at the level of the takeoff  of the left posterior inferior cerebellar artery.   TTE - Left ventricle: The cavity size was normal. Wall thickness was   increased in a pattern of mild LVH. Systolic function was normal.   The estimated ejection fraction was in the range of 60% to 65%.   Wall motion was normal; there were no regional wall motion   abnormalities. Doppler parameters are consistent with abnormal   left ventricular relaxation (grade 1 diastolic dysfunction). - Aortic valve: There was mild regurgitation. - Left atrium: The atrium was mildly dilated. Impressions: - Normal LV systolic function; grade 1 diastolic dysfunction;   sclerotic aortic valve with mild AI; mld LAE.  MRI pending   PHYSICAL EXAM  Temp:  [97.9 F (36.6 C)-99.1 F (37.3 C)] 98.3 F (36.8 C) (03/14 1200) Pulse Rate:  [61-81] 73 (03/14 1200) Resp:  [13-25] 17 (03/14 1200) BP: (116-155)/(54-119) 147/81 (03/14 1200) SpO2:  [92 %-100 %] 100 % (03/14 1200)  General - Well nourished, well developed, lethargic.  Ophthalmologic - Fundi not visualized due to noncooperative.  Cardiovascular - Regular rate and rhythm.  Neuro - lethargic, able to open eyes on voice. Hard of hearing and severe dysarthria. Eyes attending to both sides, PERRL. Left facial droop, tongue in midline, sever dysarthria. RUE 4+/5 at least, LUE 3/5, RLE 4/5 and LLE 4-/5. B/l babinski positive. DTR 1+. Sensation, coordination not cooperative on exam and gait not tested.    ASSESSMENT/PLAN Anthony Fisher is a 83 y.o. male with history of mild dementia presenting with L sided weakness. CT showed a R thalamic hemorrhage  Stroke:  right thalamic hemorrhage with IVH in setting of hypertensive emergency.   Resultant  left hemiparesis  Code Stroke CT R thalamic hemorrhage with extension into R lateral ventricle. No shift or mass effect.  CTA head and neck unremarkable  MRI pending to rule out CAA  2D Echo  EF 60-65%   LDL 70  HgbA1c 5.5  Heparin subq for  VTE prophylaxis Diet NPO time specified  No antithrombotic prior to admission  Ongoing aggressive stroke risk factor management  Therapy recommendations:  pending   Disposition:  pending   Hypertensive Emergency  BP 184/94 on arrival in setting of neurologic deficits  SBP goal < 140  on Cleviprex  Will put on po meds once passed swallow or have NG tube  Dysarthria   NPO now  Pending speech further evaluation  Consider NG tube if not able to pass swallow  Urinary retention  Frequent I/O  Bladder scan 700 this am  Will put on foley catheter  Dementia  Lives with son at home  Memory difficulty  Able to do ADLs  Other Stroke Risk Factors  Advanced age  UDS pending   Other Active Problems  Hypokalemia - supplement  Hospital day # 2  This patient is critically ill due to right BG ICH with IVH, hypertensive emergency, dementia and at significant risk of neurological worsening, death form hematoma expansion, heart failure, seizure. This patient's care requires constant monitoring of vital signs, hemodynamics, respiratory and cardiac monitoring, review of multiple databases, neurological assessment, discussion  with family, other specialists and medical decision making of high complexity. I spent 40 minutes of neurocritical care time in the care of this patient. I had long discussion with son and daughter at bedside regarding current diagnosis, treatment plan and potential complications as well as disposition. Pending speech evaluation.  Rosalin Hawking, MD PhD Stroke Neurology 02/17/2017 12:43 PM   To contact Stroke Continuity provider, please refer to [email or web address] After hours, contact General Neurology

## 2017-02-17 NOTE — Progress Notes (Addendum)
Initial Nutrition Assessment  INTERVENTION:   Vital High Protein @ 40 ml/hr (960 ml/day) Provides: 960 kcal, 84 grams protein, and 802 ml free water.  TF regimen and cleviprex at current rate providing 1824 total kcal/day   NUTRITION DIAGNOSIS:   Inadequate oral intake related to dysphagia as evidenced by NPO status.  GOAL:   Patient will meet greater than or equal to 90% of their needs  MONITOR:   Diet advancement, TF tolerance  REASON FOR ASSESSMENT:   Consult Enteral/tube feeding initiation and management  ASSESSMENT:   83 y.o. male with history of mild dementia presenting with L sided weakness. CT showed a R thalamic hemorrhage  Consult received to start enteral nutrition therapy.  Spoke with RN, Cortrak to be placed K+ 3.4 Cleviprex @ 18 ml/hr (864 kcal) Nutrition-Focused physical exam completed. Findings are no fat depletion, no muscle depletion, and no edema.  Per family pt with good appetite PTA, no recent weight loss.    Diet Order:  Diet NPO time specified  Skin:  Reviewed, no issues  Last BM:  unknown  Height:   Ht Readings from Last 1 Encounters:  02/15/17 5\' 9"  (1.753 m)    Weight:   Wt Readings from Last 1 Encounters:  02/15/17 181 lb 3.5 oz (82.2 kg)    Ideal Body Weight:  72.7 kg  BMI:  Body mass index is 26.76 kg/m.  Estimated Nutritional Needs:   Kcal:  1600-1800  Protein:  80-95 grams  Fluid:  > 1.6 L/day  EDUCATION NEEDS:   No education needs identified at this time  [place], [place], [place] Pager [phone] After Hours Pager

## 2017-02-17 NOTE — Progress Notes (Signed)
Physical Therapy Treatment Patient Details Name: Anthony Fisher MRN: [id number] DOB: Jul 06, 1926 Today's Date: 02/17/2017    History of Present Illness Pt is a 83 y.o. male who presents from home to ED on 02/15/17 with L-sided weakness. CT shows intraparenchymal hematoma within R thalamus extending into R lateral ventricle. Pertinent PMH includes dementia.     PT Comments    Pt progressing with mobility. Able to show significant improvements with sitting balance, requiring less multimodal cues; able to internalize cues and apply them to mobility throughout session. Also able to initiate steps in room today with maxA +2 and cues for trunk ext and weight shifts. Continues to have inattention to L-side, requiring max cues to use LUE for mobility.  Pt motivated to participate with PT; easily distracted my increased stimulation. Will continue to follow acutely.     Follow Up Recommendations  CIR     Equipment Recommendations  Other (comment) (Defer to next venue)    Recommendations for Other Services Rehab consult;OT consult     Precautions / Restrictions Precautions Precautions: Fall Restrictions Weight Bearing Restrictions: No    Mobility  Bed Mobility Overal bed mobility: Needs Assistance Bed Mobility: Supine to Sit     Supine to sit: Max assist     General bed mobility comments: Initiated trunk elevation and RUE/RLE movement, requiring maxA for trunk support and to maneuver LLE out of bed.  Transfers Overall transfer level: Needs assistance Equipment used: None Transfers: Sit to/from Stand Sit to Stand: +2 physical assistance;Max assist         General transfer comment: Sit to stand x2 with maxA +2  Ambulation/Gait Ambulation/Gait assistance: Max assist;+2 physical assistance Ambulation Distance (Feet): 5 Feet Assistive device: None Gait Pattern/deviations: Decreased weight shift to right;Leaning posteriorly;Narrow base of support;Step-to pattern;Decreased step  length - right;Decreased step length - left     General Gait Details: Amb with max +2 "three musketeer" technique; pt able to initiate step with RLE after facilitation of weight shift.    Stairs            Wheelchair Mobility    Modified Rankin (Stroke Patients Only) Modified Rankin (Stroke Patients Only) Pre-Morbid Rankin Score: No symptoms Modified Rankin: Severe disability     Balance Overall balance assessment: Needs assistance Sitting-balance support: No upper extremity supported;Bilateral upper extremity supported;Feet supported Sitting balance-Leahy Scale: Zero Sitting balance - Comments: Pt continues to have left lateral lean, requiring max multimodal cues to achieve and maintain upright seated balance. Able to lean onto and press up from elbows in attempt to reach object on ground.  Postural control: Left lateral lean   Standing balance-Leahy Scale: Zero Standing balance comment: MaxA +2 for standing balance, with multimodal cues for hip extension and blocking for bilat knee instability.                     Cognition Arousal/Alertness: Awake/alert Behavior During Therapy: WFL for tasks assessed/performed Overall Cognitive Status: History of cognitive impairments - at baseline Area of Impairment: Orientation;Memory Orientation Level: Disoriented to;Place;Time;Situation Current Attention Level: Sustained Memory: Decreased short-term memory Following Commands: Follows multi-step commands inconsistently;Follows one step commands with increased time   Awareness: Intellectual Problem Solving: Slow processing;Requires verbal cues;Requires tactile cues General Comments: Requires max verbal cues to attend to task. Increased distraction when attending to more than one person.     Exercises      General Comments General comments (skin integrity, edema, etc.): SpO2 remained >91% on RA. Returned to  3L O2 [place] post-treatment.       Pertinent Vitals/Pain Pain  Assessment: No/denies pain    Home Living                      Prior Function            PT Goals (current goals can now be found in the care plan section) Progress towards PT goals: Progressing toward goals    Frequency    Min 4X/week      PT Plan Current plan remains appropriate    Co-evaluation             End of Session Equipment Utilized During Treatment: Gait belt Activity Tolerance: Patient tolerated treatment well Patient left: in chair;with chair alarm set;with family/visitor present;with call bell/phone within reach Nurse Communication: Mobility status PT Visit Diagnosis: Muscle weakness (generalized) (M62.81);Hemiplegia and hemiparesis;Other abnormalities of gait and mobility (R26.89) Hemiplegia - Right/Left: Left Hemiplegia - caused by: Other Nontraumatic intracranial hemorrhage     Time: 1031-1105 PT Time Calculation (min) (ACUTE ONLY): 34 min  Charges:  $Therapeutic Activity: 8-22 mins $Neuromuscular Re-education: 8-22 mins                    G Codes:      Enis Gash, SPT [phone]  Mabeline Caras 02/17/2017, 11:58 AM

## 2017-02-17 NOTE — Progress Notes (Signed)
I met with pt, daughter and two granddaughters at bedside. We discussed a possible inpt rehab admission pending bed availability and insurance approval when pt medically ready. Pt does have dementia, but is very active and independent with intermittent supervision at home pta. Daughter states between family members, they can provide 24/7 physical care that will be needed for pt after d/c from rehab. Pt has never been hospitalized before this admission, so needs procedures and routine care offered explained in greater detail. I have encouraged family members to advocate and ask for explanations of routine hospital procedures as needed. Noted pt on cleviprex. Await further progress medically and with therapy before pursuing [place] approval for an inpt rehab admission. We will follow. [phone]

## 2017-02-17 NOTE — Progress Notes (Signed)
Pt tolerated tube placement fairly well. Tube was placed in patients right nare to 64cm. Xray has been ordered. Cortrak tube can be reinserted if it becomes dislodged. Please save tube. Please contact Cortrak team with any questions or concerns.

## 2017-02-17 NOTE — Progress Notes (Signed)
Occupational Therapy Evaluation:  Pt admitted with the below listed diagnosis and demonstrates the below listed deficits.  He requires mod - total A for ADLs and max A +2 for functional transfers.   He was very independent PTA,  is very motivated and has excellent family support.  Recommend CIR.   02/17/17 1300  OT Visit Information  Last OT Received On 02/17/17  Assistance Needed +2  History of Present Illness Pt is a 83 y.o. male who presents from home to ED on 02/15/17 with L-sided weakness. CT shows intraparenchymal hematoma within R thalamus extending into R lateral ventricle. Pertinent PMH includes dementia.   Precautions  Precautions Fall  Home Living  Family/patient expects to be discharged to: Private residence  Living Arrangements Children  Available Help at Discharge Family;Available 24 hours/day  Type of [place] to enter  Entrance Stairs-Number of Steps 1  Entrance Stairs-Rails None  Home Layout One level  Home Equipment None  Lives With Family  Prior Function  Level of Independence Independent  Comments Pt was very active and independent PTA   Communication  Communication HOH;Expressive difficulties  Pain Assessment  Pain Assessment No/denies pain  Cognition  Arousal/Alertness Awake/alert  Behavior During Therapy WFL for tasks assessed/performed  Overall Cognitive Status History of cognitive impairments - at baseline  Area of Impairment Attention;Following commands;Awareness;Safety/judgement;Problem solving  Current Attention Level Sustained  Memory Decreased short-term memory  Following Commands Follows one step commands consistently;Follows multi-step commands inconsistently  Awareness Intellectual  Problem Solving Slow processing;Difficulty sequencing;Requires verbal cues;Requires tactile cues  Upper Extremity Assessment  Upper Extremity Assessment LUE deficits/detail  LUE Deficits / Details Pt with Lt inattention/neglect.  He will  attempt to initiate movement with Lt UE when prompted.  He demonstrates activel flex/ext of fingers, ~40% elbow flexion actively, and small range shoulder flexion   LUE Sensation decreased proprioception  LUE Coordination decreased gross motor;decreased fine motor  Lower Extremity Assessment  Lower Extremity Assessment Defer to PT evaluation  Cervical / Trunk Assessment  Cervical / Trunk Assessment Kyphotic  ADL  Overall ADL's  Needs assistance/impaired  Eating/Feeding NPO  Grooming Wash/dry hands;Wash/dry face;Sitting;Moderate assistance;Brushing hair  Grooming Details (indicate cue type and reason) assist and cues to attend to the Lt   Upper Body Bathing Total assistance;Sitting  Lower Body Bathing Maximal assistance;Sit to/from stand  Upper Body Dressing  Maximal assistance;Sitting  Lower Body Dressing Maximal assistance;Sit to/from stand  Lower Body Dressing Details (indicate cue type and reason) Pt is able to doff socks with min A, but requires assist to pull socks over toes and over Lt heel   Toilet Transfer Maximal assistance;+2 for physical assistance;Squat-pivot;BSC  Toileting- Clothing Manipulation and Hygiene Total assistance;Sit to/from stand  Functional mobility during ADLs Maximal assistance;+2 for physical assistance  Vision- History  Baseline Vision/History Wears glasses (bifocals )  Wears Glasses At all times  Patient Visual Report Blurring of vision  Vision- Assessment  Vision Assessment? Yes  Eye Alignment WFL  Ocular Range of Motion WFL  Visual Fields Left visual field deficit  Additional Comments Pt with Lt neglect so difficult to accurately assess visual field and determine how much neglect is contributing to perceived field deficit   Perception  Perception Tested? Yes  Perception Deficits Inattention/neglect  Inattention/Neglect Does not attend to left visual field;Does not attend to left side of body  Praxis  Praxis tested? Deficits  Deficits Organization   Bed Mobility  General bed mobility comments in bed   Transfers  Overall transfer level Needs assistance  Equipment used None  Transfers Sit to/from Stand  Sit to Stand Max assist  General transfer comment facilitation of forward translation of trunk and assist to extend hips and trunk.  Unable to safely achieve full standing with +1 assist as pt began to push to Lt as he initiated hip extension   Balance  Overall balance assessment Needs assistance  Sitting-balance support No upper extremity supported;Bilateral upper extremity supported;Feet supported  Sitting balance-Leahy Scale Poor  Sitting balance - Comments heavy left lateral lean.  While seated worked on activation of Lt lateral trunk with reaching with Rt UE.  Pt was able to initiate lateral flexion Lt side.  Worked on trunk rotation with min - mod facilitation   Postural control Left lateral lean  Standing balance support Bilateral upper extremity supported  Standing balance-Leahy Scale Poor  Standing balance comment Pt requires max A +2   OT - End of Session  Equipment Utilized During Treatment Gait belt  Activity Tolerance Patient tolerated treatment well  Patient left in chair;with call bell/phone within reach;with chair alarm set;with family/visitor present  Nurse Communication Mobility status  OT Assessment  OT Recommendation/Assessment Patient needs continued OT Services  OT Visit Diagnosis Hemiplegia and hemiparesis  Hemiplegia - Right/Left Left  Hemiplegia - dominant/non-dominant Non-Dominant  Hemiplegia - caused by Other Nontraumatic intracranial hemorrhage  OT Problem List Decreased strength;Decreased range of motion;Decreased activity tolerance;Impaired balance (sitting and/or standing);Impaired vision/perception;Decreased coordination;Decreased cognition;Decreased safety awareness;Decreased knowledge of use of DME or AE;Impaired sensation;Impaired tone;Impaired UE functional use  OT Plan  OT Frequency (ACUTE ONLY)  Min 3X/week  OT Treatment/Interventions (ACUTE ONLY) Self-care/ADL training;Neuromuscular education;DME and/or AE instruction;Manual therapy;Therapeutic activities;Cognitive remediation/compensation;Visual/perceptual remediation/compensation;Patient/family education;Balance training  AM-PAC OT "6 Clicks" Daily Activity Outcome Measure  Help from another person eating meals? 1  Help from another person taking care of personal grooming? 2  Help from another person toileting, which includes using toliet, bedpan, or urinal? 1  Help from another person bathing (including washing, rinsing, drying)? 2  Help from another person to put on and taking off regular upper body clothing? 2  Help from another person to put on and taking off regular lower body clothing? 2  6 Click Score 10  ADL G Code Conversion CL  OT Recommendation  Recommendations for Other Services Rehab consult  Follow Up Recommendations CIR;Supervision/Assistance - 24 hour  OT Equipment 3 in 1 bedside commode;Tub/shower bench  Individuals Consulted  Consulted and Agree with Results and Recommendations Patient;Family member/caregiver  Family Member Consulted daughter   Acute Rehab OT Goals  Patient Stated Goal Return home  OT Goal Formulation With patient/family  Time For Goal Achievement 03/03/17  Potential to Achieve Goals Good  OT Time Calculation  OT Start Time (ACUTE ONLY) 1222  OT Stop Time (ACUTE ONLY) 1302  OT Time Calculation (min) 40 min  OT General Charges  $OT Visit 1 Procedure  OT Evaluation  $OT Eval Moderate Complexity 1 Procedure  OT Treatments  $Neuromuscular Re-education 23-37 mins  Written Expression  Dominant Hand Right  Lucille Passy, OTR/L [phone]

## 2017-02-18 ENCOUNTER — Inpatient Hospital Stay (HOSPITAL_COMMUNITY): Payer: Medicare Other

## 2017-02-18 LAB — GLUCOSE, CAPILLARY
GLUCOSE-CAPILLARY: 109 mg/dL — AB (ref 65–99)
GLUCOSE-CAPILLARY: 136 mg/dL — AB (ref 65–99)
GLUCOSE-CAPILLARY: 123 mg/dL — AB (ref 65–99)
GLUCOSE-CAPILLARY: 108 mg/dL — AB (ref 65–99)
Glucose-Capillary: 109 mg/dL — ABNORMAL HIGH (ref 65–99)
Glucose-Capillary: 127 mg/dL — ABNORMAL HIGH (ref 65–99)
Glucose-Capillary: 128 mg/dL — ABNORMAL HIGH (ref 65–99)

## 2017-02-18 LAB — CBC
HEMATOCRIT: 36.5 % — AB (ref 39.0–52.0)
HEMOGLOBIN: 12.7 g/dL — AB (ref 13.0–17.0)
MCH: 33.2 pg (ref 26.0–34.0)
MCHC: 34.8 g/dL (ref 30.0–36.0)
MCV: 95.5 fL (ref 78.0–100.0)
PLATELETS: 201 10*3/uL (ref 150–400)
RBC: 3.82 MIL/uL — AB (ref 4.22–5.81)
RDW: 13.1 % (ref 11.5–15.5)
WBC: 11 10*3/uL — ABNORMAL HIGH (ref 4.0–10.5)

## 2017-02-18 LAB — PHOSPHORUS
PHOSPHORUS: 1.7 mg/dL — AB (ref 2.5–4.6)
Phosphorus: 2.3 mg/dL — ABNORMAL LOW (ref 2.5–4.6)

## 2017-02-18 LAB — BASIC METABOLIC PANEL
ANION GAP: 9 (ref 5–15)
BUN: 13 mg/dL (ref 6–20)
CHLORIDE: 106 mmol/L (ref 101–111)
CO2: 22 mmol/L (ref 22–32)
Calcium: 8.5 mg/dL — ABNORMAL LOW (ref 8.9–10.3)
Creatinine, Ser: 0.75 mg/dL (ref 0.61–1.24)
GFR calc Af Amer: >60 mL/min (ref >60)
GFR calc non Af Amer: >60 mL/min (ref >60)
GLUCOSE: 125 mg/dL — AB (ref 65–99)
POTASSIUM: 3.1 mmol/L — AB (ref 3.5–5.1)
Sodium: 137 mmol/L (ref 135–145)

## 2017-02-18 LAB — MAGNESIUM
Magnesium: 1.9 mg/dL (ref 1.7–2.4)
Magnesium: 2.3 mg/dL (ref 1.7–2.4)

## 2017-02-18 MED ORDER — POTASSIUM CHLORIDE 20 MEQ/15ML (10%) PO SOLN
40.0000 meq | ORAL | Status: AC
  Administered 2017-02-18 (×3): 40 meq via ORAL
  Filled 2017-02-18 (×3): qty 30

## 2017-02-18 MED ORDER — MAGNESIUM SULFATE 2 GM/50ML IV SOLN
2.0000 g | Freq: Once | INTRAVENOUS | Status: AC
  Administered 2017-02-18: 2 g via INTRAVENOUS
  Filled 2017-02-18: qty 50

## 2017-02-18 MED ORDER — LISINOPRIL 20 MG PO TABS
20.0000 mg | ORAL_TABLET | Freq: Two times a day (BID) | ORAL | Status: DC
  Administered 2017-02-18 – 2017-02-23 (×11): 20 mg via ORAL
  Filled 2017-02-18 (×11): qty 1

## 2017-02-18 MED ORDER — AMLODIPINE BESYLATE 10 MG PO TABS
10.0000 mg | ORAL_TABLET | Freq: Every day | ORAL | Status: DC
  Administered 2017-02-18 – 2017-02-23 (×6): 10 mg via ORAL
  Filled 2017-02-18 (×6): qty 1

## 2017-02-18 NOTE — Progress Notes (Signed)
  Speech Language Pathology Treatment: Dysphagia  Patient Details Name: Anthony Fisher MRN: [id number] DOB: 09-26-1926 Today's Date: 02/18/2017 Time: 1310-1330 SLP Time Calculation (min) (ACUTE ONLY): 20 min  Assessment / Plan / Recommendation Clinical Impression  Pt seen at bedside for po trials. Pt more alert today, but continues to exhibit anterior leakage of secretions as well as po trials, minimal labial and lingual movement during speech and po trials. Pt was given ice chips, and required encouragement to chew and swallow. Pt exhibits poor awareness of bolus with anterior leakage and minimal oral manipulation, suspected delayed swallow reflex. No voice quality change, however, weak delayed cough was noted. Magic cup was also given. Pt required cues to remove it from the spoon, and did not manipulate the bolus at all. Bolus was suctioned from pt mouth. By this point, pt was fatiguing, so po trials were discontinued. NPO status continues to be recommended. ST will continue to follow for po trials and cog/com treatment. Results and recommendations were reviewed with pt's daughter and RN.   HPI HPI: Pt is a 83 y.o. male who presents from home to ED on 02/15/17 with L-sided weakness. CT shows intraparenchymal hematoma within R thalamus extending into R lateral ventricle. Pertinent PMH includes dementia.       SLP Plan  Continue with current plan of care       Recommendations  Diet recommendations: NPO Medication Administration: Via alternative means                General recommendations: Rehab consult Oral Care Recommendations: Oral care QID Follow up Recommendations: Inpatient Rehab SLP Visit Diagnosis: Dysarthria and anarthria (R47.1);Attention and concentration deficit;Dysphagia, oropharyngeal phase (R13.12) Attention and concentration deficit following: Nontraumatic intracerebral hemorrhage Plan: Continue with current plan of care       Mckenna Boruff B. Quentin Ore [facility],  CCC-SLP [phone]  Shonna Chock 02/18/2017, 2:05 PM

## 2017-02-18 NOTE — Progress Notes (Signed)
STROKE TEAM PROGRESS NOTE   SUBJECTIVE (INTERVAL HISTORY) Daughter and sister are at bedside. Pt more awake alert but still has severe dysarthria, did not pass swallow. On tube feeding now. Has overnight delirium and tried to take off IV line. This morning seems calm. Still on cleviprex.    OBJECTIVE Temp:  [98.1 F (36.7 C)-99.2 F (37.3 C)] 98.5 F (36.9 C) (03/15 1600) Pulse Rate:  [68-101] 73 (03/15 2100) Cardiac Rhythm: Normal sinus rhythm (03/15 2000) Resp:  [15-25] 15 (03/15 2100) BP: (123-161)/(59-137) 144/70 (03/15 2100) SpO2:  [93 %-100 %] 97 % (03/15 2100) Weight:  [188 lb 4.4 oz (85.4 kg)] 188 lb 4.4 oz (85.4 kg) (03/15 0500)  CBC:   Recent Labs Lab 02/15/17 1924  02/17/17 0310 02/18/17 0446  WBC 5.8  < > 9.8 11.0*  NEUTROABS 2.6  --   --   --   HGB 12.9*  < > 12.5* 12.7*  HCT 37.5*  < > 36.4* 36.5*  MCV 96.2  < > 97.1 95.5  PLT 206  < > 190 201  < > = values in this interval not displayed.  Basic Metabolic Panel:   Recent Labs Lab 02/17/17 0310  02/18/17 0446 02/18/17 1822  NA 138  --  137  --   K 3.4*  --  3.1*  --   CL 108  --  106  --   CO2 20*  --  22  --   GLUCOSE 104*  --  125*  --   BUN 10  --  13  --   CREATININE 0.79  --  0.75  --   CALCIUM 8.3*  --  8.5*  --   MG  --   < > 1.9 2.3  PHOS  --   < > 2.3* 1.7*  < > = values in this interval not displayed.  Lipid Panel:     Component Value Date/Time   CHOL 131 02/16/2017 0804   TRIG 69 02/16/2017 0804   HDL 47 02/16/2017 0804   CHOLHDL 2.8 02/16/2017 0804   VLDL 14 02/16/2017 0804   LDLCALC 70 02/16/2017 0804   HgbA1c:  Lab Results  Component Value Date   HGBA1C 5.5 02/16/2017   Urine Drug Screen: No results found for: LABOPIA, COCAINSCRNUR, LABBENZ, AMPHETMU, THCU, LABBARB    IMAGING I have personally reviewed the radiological images below and agree with the radiology interpretations.  Dg Chest Port 1 View 02/16/2017 Cardiomegaly. Bibasilar atelectasis.   Ct Head Code  Stroke W/o Cm 02/15/2017 1. Intraparenchymal hematoma within the right thalamus extending into the atrium of the right lateral ventricle. The location is most consistent with hypertensive hemorrhage. 2. No midline shift or other significant mass effect. 3. Intraventricular blood products may lead to hydrocephalus. Short interval surveillance is warranted.   Ct Angio Head W Or Wo Contrast 02/16/2017 IMPRESSION: CT HEAD 2.8 x 2 x 2.8 cm right thalamic hemorrhage with overall similar dimensions to the recent exam. Slight increase in degree of surrounding vasogenic edema. Mild mass effect with compression right lateral aspect of the third ventricle. Breakthrough of hemorrhage into right lateral ventricle as previously noted. Tiny amount of intraventricular blood now also noted within the dependent aspect of the left lateral ventricle. CTA NECK Carotid arteries are tortuous with medial displacement of internal carotid arteries bilaterally. No evidence of hemodynamically significant stenosis of either carotid artery. Mild to slightly moderate narrowing proximal right subclavian artery. Right vertebral artery is dominant. CTA HEAD Calcified plaque with  mild narrowing cavernous segment internal carotid artery bilaterally. No significant stenosis carotid terminus, A1 segment or M1 segment of the anterior cerebral artery or middle cerebral artery on either side. No aneurysm or vascular malformation noted. Right vertebral artery is dominant with minimal plaque and narrowing. Left vertebral artery predominantly ends in a posterior inferior cerebellar artery distribution with moderate narrowing at the level of the takeoff of the left posterior inferior cerebellar artery.   TTE - Left ventricle: The cavity size was normal. Wall thickness was   increased in a pattern of mild LVH. Systolic function was normal.   The estimated ejection fraction was in the range of 60% to 65%.   Wall motion was normal; there were no regional  wall motion   abnormalities. Doppler parameters are consistent with abnormal   left ventricular relaxation (grade 1 diastolic dysfunction). - Aortic valve: There was mild regurgitation. - Left atrium: The atrium was mildly dilated. Impressions: - Normal LV systolic function; grade 1 diastolic dysfunction;   sclerotic aortic valve with mild AI; mld LAE.  MRI pending   PHYSICAL EXAM  Temp:  [98.1 F (36.7 C)-99.2 F (37.3 C)] 98.5 F (36.9 C) (03/15 1600) Pulse Rate:  [68-101] 73 (03/15 2100) Resp:  [15-25] 15 (03/15 2100) BP: (123-161)/(59-137) 144/70 (03/15 2100) SpO2:  [93 %-100 %] 97 % (03/15 2100) Weight:  [188 lb 4.4 oz (85.4 kg)] 188 lb 4.4 oz (85.4 kg) (03/15 0500)  General - Well nourished, well developed, not in acute distress.  Ophthalmologic - Fundi not visualized due to noncooperative.  Cardiovascular - Regular rate and rhythm.  Neuro - awake, alert, able to open eyes on voice. Hard of hearing and severe dysarthria. Eyes attending to both sides, PERRL. Left facial droop, tongue in midline, sever dysarthria. RUE 4+/5 at least, LUE 3/5, RLE 4/5 and LLE 4-/5. B/l babinski positive. DTR 1+. Sensation, coordination not cooperative on exam and gait not tested.    ASSESSMENT/PLAN [name] is a 83 y.o. male with history of mild dementia presenting with L sided weakness. CT showed a R thalamic hemorrhage  Stroke:  right thalamic hemorrhage with IVH in setting of hypertensive emergency.   Resultant  left hemiparesis  Code Stroke CT R thalamic hemorrhage with extension into R lateral ventricle. No shift or mass effect.  CTA head and neck unremarkable  MRI pending to rule out CAA  2D Echo  EF 60-65%   LDL 70  HgbA1c 5.5  Heparin subq for VTE prophylaxis Diet NPO time specified  No antithrombotic prior to admission  Ongoing aggressive stroke risk factor management  Therapy recommendations:  CIR  Disposition:  pending   Hypertensive  Emergency  BP 184/94 on arrival in setting of neurologic deficits  SBP goal < 140  on Cleviprex  Put on lisinopril and amlodipine  Dysphagia   NPO now  Pending speech further evaluation  Consider NG tube if not able to pass swallow  Urinary retention  Frequent I/O  Bladder scan 700 this am  On foley catheter  Dementia  Lives with son at home  Memory difficulty  Able to do ADLs  Other Stroke Risk Factors  Advanced age  UDS pending   Other Active Problems  Hypokalemia - supplement  Hospital day # 3  This patient is critically ill due to right BG ICH with IVH, hypertensive emergency, dementia and at significant risk of neurological worsening, death form hematoma expansion, heart failure, seizure. This patient's care requires constant monitoring of vital  signs, hemodynamics, respiratory and cardiac monitoring, review of multiple databases, neurological assessment, discussion with family, other specialists and medical decision making of high complexity. I spent 40 minutes of neurocritical care time in the care of this patient. I had long discussion with daughter at bedside regarding current diagnosis, treatment plan and potential complications as well as disposition.   Rosalin Hawking, MD PhD Stroke Neurology 02/18/2017 10:37 PM   To contact Stroke Continuity provider, please refer to [email or web address] After hours, contact General Neurology

## 2017-02-18 NOTE — Progress Notes (Signed)
Patient having bridle placed on Cortrak

## 2017-02-18 NOTE — Progress Notes (Signed)
Physical Therapy Treatment Patient Details Name: Anthony Fisher MRN: [id number] DOB: Aug 19, 1926 Today's Date: 02/18/2017    History of Present Illness Pt is a 83 y.o. male who presents from home to ED on 02/15/17 with L-sided weakness. CT shows intraparenchymal hematoma within R thalamus extending into R lateral ventricle. Pertinent PMH includes dementia.     PT Comments    Pt lethargic this am and family indicate he had been up most of the night.  Pt does arouse once placed in a seated position on EOB and starts to answer questions, but still weak and requiring extensive A.  Continue to feel pt would benefit from CIR level of care at D/C.  Will continue to follow.     Follow Up Recommendations  CIR     Equipment Recommendations  None recommended by PT    Recommendations for Other Services Rehab consult     Precautions / Restrictions Precautions Precautions: Fall Restrictions Weight Bearing Restrictions: No    Mobility  Bed Mobility Overal bed mobility: Needs Assistance Bed Mobility: Supine to Sit     Supine to sit: Total assist;+2 for physical assistance;HOB elevated     General bed mobility comments: pt lethargic and needs initiation for movement and extensive A.    Transfers Overall transfer level: Needs assistance Equipment used: 2 person hand held assist Transfers: Sit to/from [facility] Sit to Stand: Max assist;+2 physical assistance Stand pivot transfers: Max assist;+2 physical assistance       General transfer comment: pt does participate with coming to standing and bears some weight through Bil LEs.  Cues and facilitation for movement through pivot towards pt's R side.    Ambulation/Gait                 Stairs            Wheelchair Mobility    Modified Rankin (Stroke Patients Only) Modified Rankin (Stroke Patients Only) Pre-Morbid Rankin Score: No significant disability Modified Rankin: Severe disability      Balance Overall balance assessment: Needs assistance Sitting-balance support: No upper extremity supported;Feet supported Sitting balance-Leahy Scale: Poor Sitting balance - Comments: pt leans heavily to L side and posteriorly. Postural control: Posterior lean;Left lateral lean Standing balance support: During functional activity Standing balance-Leahy Scale: Poor                      Cognition Arousal/Alertness: Lethargic Behavior During Therapy: Flat affect Overall Cognitive Status: Difficult to assess                 General Comments: pt lethargic and difficult to arouse.  during mobility pt did begin to open eyes and responds to some questions.  Arousal lasts briefly and once he is not moving he falls asleep again.      Exercises      General Comments        Pertinent Vitals/Pain Pain Assessment: Faces Faces Pain Scale: No hurt    Home Living                      Prior Function            PT Goals (current goals can now be found in the care plan section) Acute Rehab PT Goals Patient Stated Goal: Return home PT Goal Formulation: With patient Time For Goal Achievement: 03/02/17 Potential to Achieve Goals: Fair Progress towards PT goals: Not progressing toward goals - comment (Lethargy)  Frequency    Min 4X/week      PT Plan Current plan remains appropriate    Co-evaluation             End of Session Equipment Utilized During Treatment: Gait belt Activity Tolerance: Patient limited by lethargy Patient left: in chair;with call bell/phone within reach;with chair alarm set;with family/visitor present Nurse Communication: Mobility status;Need for lift equipment PT Visit Diagnosis: Muscle weakness (generalized) (M62.81);Hemiplegia and hemiparesis;Other abnormalities of gait and mobility (R26.89) Hemiplegia - Right/Left: Left Hemiplegia - dominant/non-dominant: Non-dominant Hemiplegia - caused by: Other Nontraumatic intracranial  hemorrhage     Time: 0842-0903 PT Time Calculation (min) (ACUTE ONLY): 21 min  Charges:  $Therapeutic Activity: 8-22 mins                    G CodesCatarina Hartshorn, [place] [phone] 02/18/2017, 9:54 AM

## 2017-02-19 ENCOUNTER — Inpatient Hospital Stay (HOSPITAL_COMMUNITY): Payer: Medicare Other

## 2017-02-19 DIAGNOSIS — F0391 Unspecified dementia with behavioral disturbance: Secondary | ICD-10-CM

## 2017-02-19 LAB — RAPID URINE DRUG SCREEN, HOSP PERFORMED
AMPHETAMINES: NOT DETECTED
Barbiturates: NOT DETECTED
Benzodiazepines: NOT DETECTED
Cocaine: NOT DETECTED
OPIATES: NOT DETECTED
TETRAHYDROCANNABINOL: NOT DETECTED

## 2017-02-19 LAB — URINALYSIS, ROUTINE W REFLEX MICROSCOPIC
Bilirubin Urine: NEGATIVE
GLUCOSE, UA: NEGATIVE mg/dL
HGB URINE DIPSTICK: NEGATIVE
Ketones, ur: NEGATIVE mg/dL
NITRITE: NEGATIVE
Protein, ur: NEGATIVE mg/dL
SPECIFIC GRAVITY, URINE: 1.019 (ref 1.005–1.030)
Squamous Epithelial / LPF: NONE SEEN
pH: 7 (ref 5.0–8.0)

## 2017-02-19 LAB — GLUCOSE, CAPILLARY
GLUCOSE-CAPILLARY: 127 mg/dL — AB (ref 65–99)
GLUCOSE-CAPILLARY: 122 mg/dL — AB (ref 65–99)
GLUCOSE-CAPILLARY: 108 mg/dL — AB (ref 65–99)
Glucose-Capillary: 143 mg/dL — ABNORMAL HIGH (ref 65–99)
Glucose-Capillary: 122 mg/dL — ABNORMAL HIGH (ref 65–99)

## 2017-02-19 LAB — CBC
HCT: 34.6 % — ABNORMAL LOW (ref 39.0–52.0)
HEMOGLOBIN: 11.9 g/dL — AB (ref 13.0–17.0)
MCH: 33.1 pg (ref 26.0–34.0)
MCHC: 34.4 g/dL (ref 30.0–36.0)
MCV: 96.4 fL (ref 78.0–100.0)
Platelets: 176 10*3/uL (ref 150–400)
RBC: 3.59 MIL/uL — AB (ref 4.22–5.81)
RDW: 13.5 % (ref 11.5–15.5)
WBC: 7.4 10*3/uL (ref 4.0–10.5)

## 2017-02-19 LAB — BASIC METABOLIC PANEL
Anion gap: 4 — ABNORMAL LOW (ref 5–15)
BUN: 18 mg/dL (ref 6–20)
CHLORIDE: 111 mmol/L (ref 101–111)
CO2: 23 mmol/L (ref 22–32)
Calcium: 8.4 mg/dL — ABNORMAL LOW (ref 8.9–10.3)
Creatinine, Ser: 0.79 mg/dL (ref 0.61–1.24)
GFR calc non Af Amer: >60 mL/min (ref >60)
Glucose, Bld: 123 mg/dL — ABNORMAL HIGH (ref 65–99)
POTASSIUM: 3.6 mmol/L (ref 3.5–5.1)
SODIUM: 138 mmol/L (ref 135–145)

## 2017-02-19 MED ORDER — SODIUM CHLORIDE 0.9 % IV SOLN
INTRAVENOUS | Status: DC
  Administered 2017-02-20 – 2017-02-22 (×3): via INTRAVENOUS

## 2017-02-19 MED ORDER — LABETALOL HCL 5 MG/ML IV SOLN: 10.0000 mg | INTRAVENOUS | Status: DC | PRN

## 2017-02-19 MED ORDER — POLYETHYLENE GLYCOL 3350 17 G PO PACK
17.0000 g | PACK | Freq: Every day | ORAL | Status: DC
  Administered 2017-02-19 – 2017-02-23 (×5): 17 g via ORAL
  Filled 2017-02-19 (×3): qty 1

## 2017-02-19 MED ORDER — PANTOPRAZOLE SODIUM 40 MG PO TBEC
40.0000 mg | DELAYED_RELEASE_TABLET | Freq: Every day | ORAL | Status: DC
  Administered 2017-02-20 – 2017-02-21 (×2): 40 mg via ORAL
  Filled 2017-02-19 (×3): qty 1

## 2017-02-19 NOTE — Progress Notes (Signed)
Case Management Note  Patient Details  Name: Anthony Fisher MRN: [id number] Date of Birth: 05/06/1926  Subjective/Objective:  Pt admitted on 02/15/17 s/p intraparenchymal hematoma within Rt thalamus extending into Rt lateral ventricle.  PTA, pt resides at home with adult children.                    Action/Plan: Will follow for discharge planning as pt progresses.    Expected Discharge Date:                         Expected Discharge Plan:  [name]  In-House Referral:     Discharge planning Services  CM Consult  Post Acute Care Choice:    Choice offered to:     DME Arranged:    DME Agency:     HH Arranged:    [place] Agency:     Status of Service:  In process, will continue to follow  If discussed at Long Length of Stay Meetings, dates discussed:    Additional Comments:  02/19/17 Per [name], pt not participating enough with therapies yet to begin insurance authorization for inpatient rehab admission.  They will reassess pt on Monday, 3/19.  Family able to provide care at discharge.  [name], RN, BSN  [name], RN, BSN  Trauma/Neuro ICU Case Manager [phone]

## 2017-02-19 NOTE — Progress Notes (Signed)
STROKE TEAM PROGRESS NOTE   SUBJECTIVE (INTERVAL HISTORY) Daughter is at bedside. Pt overnight no acute changes. But lethargic and still sever dysarthria. Off cleviprex now. On TF. Had MRI brain showed no CAA. Will transfer to floor.    OBJECTIVE Temp:  [97.7 F (36.5 C)-98.5 F (36.9 C)] 97.7 F (36.5 C) (03/16 0400) Pulse Rate:  [67-95] 69 (03/16 0600) Cardiac Rhythm: Normal sinus rhythm (03/16 0400) Resp:  [15-23] 20 (03/16 0600) BP: (123-161)/(59-137) 138/73 (03/16 0600) SpO2:  [93 %-100 %] 96 % (03/16 0600) Weight:  [86 kg (189 lb 9.5 oz)] 86 kg (189 lb 9.5 oz) (03/16 0500)  CBC:   Recent Labs Lab 02/15/17 1924  02/18/17 0446 02/19/17 0339  WBC 5.8  < > 11.0* 7.4  NEUTROABS 2.6  --   --   --   HGB 12.9*  < > 12.7* 11.9*  HCT 37.5*  < > 36.5* 34.6*  MCV 96.2  < > 95.5 96.4  PLT 206  < > 201 176  < > = values in this interval not displayed.  Basic Metabolic Panel:   Recent Labs Lab 02/18/17 0446 02/18/17 1822 02/19/17 0339  NA 137  --  138  K 3.1*  --  3.6  CL 106  --  111  CO2 22  --  23  GLUCOSE 125*  --  123*  BUN 13  --  18  CREATININE 0.75  --  0.79  CALCIUM 8.5*  --  8.4*  MG 1.9 2.3  --   PHOS 2.3* 1.7*  --     Lipid Panel:     Component Value Date/Time   CHOL 131 02/16/2017 0804   TRIG 69 02/16/2017 0804   HDL 47 02/16/2017 0804   CHOLHDL 2.8 02/16/2017 0804   VLDL 14 02/16/2017 0804   LDLCALC 70 02/16/2017 0804   HgbA1c:  Lab Results  Component Value Date   HGBA1C 5.5 02/16/2017   Urine Drug Screen: No results found for: LABOPIA, COCAINSCRNUR, LABBENZ, AMPHETMU, THCU, LABBARB    IMAGING I have personally reviewed the radiological images below and agree with the radiology interpretations.  Dg Chest Port 1 View 02/16/2017 Cardiomegaly. Bibasilar atelectasis.   Ct Head Code Stroke W/o Cm 02/15/2017 1. Intraparenchymal hematoma within the right thalamus extending into the atrium of the right lateral ventricle. The location is most  consistent with hypertensive hemorrhage. 2. No midline shift or other significant mass effect. 3. Intraventricular blood products may lead to hydrocephalus. Short interval surveillance is warranted.   Ct Angio Head W Or Wo Contrast 02/16/2017 IMPRESSION: CT HEAD 2.8 x 2 x 2.8 cm right thalamic hemorrhage with overall similar dimensions to the recent exam. Slight increase in degree of surrounding vasogenic edema. Mild mass effect with compression right lateral aspect of the third ventricle. Breakthrough of hemorrhage into right lateral ventricle as previously noted. Tiny amount of intraventricular blood now also noted within the dependent aspect of the left lateral ventricle. CTA NECK Carotid arteries are tortuous with medial displacement of internal carotid arteries bilaterally. No evidence of hemodynamically significant stenosis of either carotid artery. Mild to slightly moderate narrowing proximal right subclavian artery. Right vertebral artery is dominant. CTA HEAD Calcified plaque with mild narrowing cavernous segment internal carotid artery bilaterally. No significant stenosis carotid terminus, A1 segment or M1 segment of the anterior cerebral artery or middle cerebral artery on either side. No aneurysm or vascular malformation noted. Right vertebral artery is dominant with minimal plaque and narrowing. Left vertebral artery  predominantly ends in a posterior inferior cerebellar artery distribution with moderate narrowing at the level of the takeoff of the left posterior inferior cerebellar artery.   TTE - Left ventricle: The cavity size was normal. Wall thickness was   increased in a pattern of mild LVH. Systolic function was normal.   The estimated ejection fraction was in the range of 60% to 65%.   Wall motion was normal; there were no regional wall motion   abnormalities. Doppler parameters are consistent with abnormal   left ventricular relaxation (grade 1 diastolic dysfunction). - Aortic valve:  There was mild regurgitation. - Left atrium: The atrium was mildly dilated. Impressions: - Normal LV systolic function; grade 1 diastolic dysfunction;   sclerotic aortic valve with mild AI; mld LAE.  MR BRAIN 02/18/2017 Evolving RIGHT thalamic hemorrhage with intraventricular extension. Old RIGHT caudate lacunar. Mild chronic small vessel ischemic disease. Advanced temporal lobe atrophy associated with neurodegenerative disorder.   PHYSICAL EXAM  Temp:  [97.7 F (36.5 C)-98.5 F (36.9 C)] 97.7 F (36.5 C) (03/16 0400) Pulse Rate:  [67-95] 69 (03/16 0600) Resp:  [15-23] 20 (03/16 0600) BP: (123-161)/(59-137) 138/73 (03/16 0600) SpO2:  [93 %-100 %] 96 % (03/16 0600) Weight:  [86 kg (189 lb 9.5 oz)] 86 kg (189 lb 9.5 oz) (03/16 0500)  General - Well nourished, well developed, not in acute distress.  Ophthalmologic - Fundi not visualized due to noncooperative.  Cardiovascular - Regular rate and rhythm.  Neuro - awake, alert, able to open eyes on voice. Hard of hearing and severe dysarthria. Eyes attending to both sides, PERRL. Left facial droop, tongue in midline, sever dysarthria. RUE 4+/5 at least, LUE 3/5, RLE 4/5 and LLE 4-/5. B/l babinski positive. DTR 1+. Sensation, coordination not cooperative on exam and gait not tested.    ASSESSMENT/PLAN [name] is a 83 y.o. male with history of mild dementia presenting with L sided weakness. CT showed a R thalamic hemorrhage  ICH:  right thalamic hemorrhage with IVH in setting of hypertensive emergency.   Resultant  left hemiparesis  Code Stroke CT R thalamic hemorrhage with extension into R lateral ventricle. No shift or mass effect.  CTA head and neck unremarkable  MRI - 02/18/2017 - no CAA.  2D Echo  EF 60-65%   LDL 70  HgbA1c 5.5  Heparin subq for VTE prophylaxis Diet NPO time specified  No antithrombotic prior to admission  Ongoing aggressive stroke risk factor management  Therapy recommendations:   CIR vs SNF  Disposition:  pending   Hypertensive Emergency  BP 184/94 on arrival in setting of neurologic deficits  SBP goal < 140  on Cleviprex  Put on lisinopril and amlodipine  Dysphagia   NPO now  Pending speech further evaluation  Consider NG tube if not able to pass swallow  Urinary retention  Frequent I/O  On foley catheter  Dementia  Lives with son at home  Memory difficulty  Able to do ADLs  Other Stroke Risk Factors  Advanced age  UDS negative   Other Active Problems  Hypokalemia - supplement  Hospital day # 4  This patient is critically ill due to right BG ICH with IVH, hypertensive emergency, dementia and at significant risk of neurological worsening, death form hematoma expansion, heart failure, seizure. This patient's care requires constant monitoring of vital signs, hemodynamics, respiratory and cardiac monitoring, review of multiple databases, neurological assessment, discussion with family, other specialists and medical decision making of high complexity. I spent 35  minutes of neurocritical care time in the care of this patient.  Rosalin Hawking, MD PhD Stroke Neurology 02/19/2017 10:35 PM   To contact Stroke Continuity provider, please refer to [email or web address] After hours, contact General Neurology

## 2017-02-19 NOTE — Progress Notes (Signed)
Rehab admissions - patient is still not participatory enough for me to submit to insurance to seek authorization for acute inpatient rehab.  I spoke with Dr. Erlinda Hong about patient today.  He says patient is still very lethargic as well.  We will follow up on Monday for progress.  Call me for questions.  #[phone]

## 2017-02-19 NOTE — Progress Notes (Signed)
Patient transferred from unit 59M to room 5M20 at this time. Alert and sounded congested with sputum in mouth. Suctioned and encouraged to cough but weak. Family at bedside and oriented to room and call light.

## 2017-02-20 LAB — GLUCOSE, CAPILLARY
GLUCOSE-CAPILLARY: 113 mg/dL — AB (ref 65–99)
GLUCOSE-CAPILLARY: 118 mg/dL — AB (ref 65–99)
Glucose-Capillary: 122 mg/dL — ABNORMAL HIGH (ref 65–99)
Glucose-Capillary: 127 mg/dL — ABNORMAL HIGH (ref 65–99)
Glucose-Capillary: 118 mg/dL — ABNORMAL HIGH (ref 65–99)
Glucose-Capillary: 120 mg/dL — ABNORMAL HIGH (ref 65–99)

## 2017-02-20 LAB — BASIC METABOLIC PANEL
ANION GAP: 6 (ref 5–15)
BUN: 20 mg/dL (ref 6–20)
CALCIUM: 8.7 mg/dL — AB (ref 8.9–10.3)
CO2: 22 mmol/L (ref 22–32)
Chloride: 109 mmol/L (ref 101–111)
Creatinine, Ser: 0.74 mg/dL (ref 0.61–1.24)
GFR calc Af Amer: >60 mL/min (ref >60)
GLUCOSE: 130 mg/dL — AB (ref 65–99)
POTASSIUM: 3.4 mmol/L — AB (ref 3.5–5.1)
Sodium: 137 mmol/L (ref 135–145)

## 2017-02-20 LAB — CBC
HEMATOCRIT: 36.6 % — AB (ref 39.0–52.0)
Hemoglobin: 12.6 g/dL — ABNORMAL LOW (ref 13.0–17.0)
MCH: 33.3 pg (ref 26.0–34.0)
MCHC: 34.4 g/dL (ref 30.0–36.0)
MCV: 96.8 fL (ref 78.0–100.0)
PLATELETS: 185 10*3/uL (ref 150–400)
RBC: 3.78 MIL/uL — ABNORMAL LOW (ref 4.22–5.81)
RDW: 13.6 % (ref 11.5–15.5)
WBC: 7.7 10*3/uL (ref 4.0–10.5)

## 2017-02-20 MED ORDER — POTASSIUM CHLORIDE 20 MEQ PO PACK
20.0000 meq | PACK | Freq: Two times a day (BID) | ORAL | Status: DC
  Administered 2017-02-20 – 2017-02-21 (×3): 20 meq via ORAL
  Filled 2017-02-20 (×5): qty 1

## 2017-02-20 MED ORDER — MELATONIN 3 MG PO TABS
3.0000 mg | ORAL_TABLET | Freq: Every day | ORAL | Status: DC
  Administered 2017-02-20 – 2017-02-22 (×3): 3 mg via ORAL
  Filled 2017-02-20 (×5): qty 1

## 2017-02-20 NOTE — Progress Notes (Signed)
STROKE TEAM PROGRESS NOTE  HPI Anthony Fisher is a 83 y.o. male with a history of mild dementia who presents with left-sided weakness that started earlier today. He apparently was up and around around 8 or 9 AM and was normal at that time. Then "a couple of hours later" he was noticed to be stumbling, and decided that he wanted to lay down. He was then not seen until 7 PM, when they heard a fall and found him with left-sided weakness.  LKW: 8 AM tpa given?: no, ICH ICH Score: 2   SUBJECTIVE (INTERVAL HISTORY) Daughter is at bedside. Pt overnight no acute changes. Still lethargic.  Apparently he has his days and nights confused and does not sleep well at night.  Daughter noted that patient sounded "congested" yesterday but not today    OBJECTIVE Temp:  [98.6 F (37 C)-99.9 F (37.7 C)] 99.3 F (37.4 C) (03/17 1004) Pulse Rate:  [52-78] 52 (03/17 1004) Cardiac Rhythm: Normal sinus rhythm (03/17 0700) Resp:  [17-20] 17 (03/17 1004) BP: (139-170)/(61-87) 139/71 (03/17 1004) SpO2:  [95 %-100 %] 96 % (03/17 1004)  CBC:   Recent Labs Lab 02/15/17 1924  02/19/17 0339 02/20/17 0356  WBC 5.8  < > 7.4 7.7  NEUTROABS 2.6  --   --   --   HGB 12.9*  < > 11.9* 12.6*  HCT 37.5*  < > 34.6* 36.6*  MCV 96.2  < > 96.4 96.8  PLT 206  < > 176 185  < > = values in this interval not displayed.  Basic Metabolic Panel:   Recent Labs Lab 02/18/17 0446 02/18/17 1822 02/19/17 0339 02/20/17 0356  NA 137  --  138 137  K 3.1*  --  3.6 3.4*  CL 106  --  111 109  CO2 22  --  23 22  GLUCOSE 125*  --  123* 130*  BUN 13  --  18 20  CREATININE 0.75  --  0.79 0.74  CALCIUM 8.5*  --  8.4* 8.7*  MG 1.9 2.3  --   --   PHOS 2.3* 1.7*  --   --     Lipid Panel:     Component Value Date/Time   CHOL 131 02/16/2017 0804   TRIG 69 02/16/2017 0804   HDL 47 02/16/2017 0804   CHOLHDL 2.8 02/16/2017 0804   VLDL 14 02/16/2017 0804   LDLCALC 70 02/16/2017 0804   HgbA1c:  Lab Results  Component  Value Date   HGBA1C 5.5 02/16/2017   Urine Drug Screen:     Component Value Date/Time   LABOPIA NONE DETECTED 02/19/2017 1040   COCAINSCRNUR NONE DETECTED 02/19/2017 1040   LABBENZ NONE DETECTED 02/19/2017 1040   AMPHETMU NONE DETECTED 02/19/2017 1040   THCU NONE DETECTED 02/19/2017 1040   LABBARB NONE DETECTED 02/19/2017 1040      IMAGING I have personally reviewed the radiological images below and agree with the radiology interpretations.  Dg Chest Port 1 View 02/16/2017 Cardiomegaly. Bibasilar atelectasis.   Ct Head Code Stroke W/o Cm 02/15/2017 1. Intraparenchymal hematoma within the right thalamus extending into the atrium of the right lateral ventricle.   The location is most consistent with hypertensive hemorrhage.  2. No midline shift or other significant mass effect.  3. Intraventricular blood products may lead to hydrocephalus. Short interval surveillance is warranted.    Ct Angio Head W Or Wo Contrast 02/16/2017  CT HEAD  2.8 x 2 x 2.8 cm right thalamic hemorrhage  with overall similar dimensions to the recent exam. Slight increase in degree of surrounding vasogenic edema. Mild mass effect with compression right lateral aspect of the third ventricle. Breakthrough of hemorrhage into right lateral ventricle as previously noted. Tiny amount of intraventricular blood now also noted within the dependent aspect of the left lateral ventricle.    CTA NECK  Carotid arteries are tortuous with medial displacement of internal carotid arteries bilaterally.  No evidence of hemodynamically significant stenosis of either carotid artery.  Mild to slightly moderate narrowing proximal right subclavian artery. Right vertebral artery is dominant.    CTA HEAD  Calcified plaque with mild narrowing cavernous segment internal carotid artery bilaterally. No significant stenosis carotid terminus, A1 segment or M1 segment of the anterior cerebral artery or middle cerebral artery on either  side. No aneurysm or vascular malformation noted. Right vertebral artery is dominant with minimal plaque and narrowing. Left vertebral artery predominantly ends in a posterior inferior cerebellar artery distribution with moderate narrowing at the level of the takeoff of the left posterior inferior cerebellar artery.    MR BRAIN 02/18/2017 Evolving RIGHT thalamic hemorrhage with intraventricular extension. Old RIGHT caudate lacunar. Mild chronic small vessel ischemic disease. Advanced temporal lobe atrophy associated with neurodegenerative disorder.   TTE  Left ventricle: The cavity size was normal. Wall thickness was   increased in a pattern of mild LVH. Systolic function was normal.   The estimated ejection fraction was in the range of 60% to 65%.   Wall motion was normal; there were no regional wall motion   abnormalities. Doppler parameters are consistent with abnormal   left ventricular relaxation (grade 1 diastolic dysfunction). - Aortic valve: There was mild regurgitation. - Left atrium: The atrium was mildly dilated. Impressions: - Normal LV systolic function; grade 1 diastolic dysfunction;   sclerotic aortic valve with mild AI; mld LAE.   PHYSICAL EXAM  Temp:  [98.6 F (37 C)-99.9 F (37.7 C)] 99.3 F (37.4 C) (03/17 1004) Pulse Rate:  [52-78] 52 (03/17 1004) Resp:  [17-20] 17 (03/17 1004) BP: (139-170)/(61-87) 139/71 (03/17 1004) SpO2:  [95 %-100 %] 96 % (03/17 1004)  General - Well nourished, well developed, not in acute distress.  Ophthalmologic - Fundi not visualized due to noncooperative.  Cardiovascular - Regular rate and rhythm.  Neuro - awake, alert, able to open eyes on voice. Hard of hearing and severe dysarthria. Eyes attending to both sides, PERRL. Left facial droop, tongue in midline, sever dysarthria. RUE 4+/5 at least, LUE 3/5, RLE 4/5 and LLE 4-/5. B/l babinski positive. DTR 1+. Sensation, coordination not cooperative on exam and gait not tested.     ASSESSMENT/PLAN Anthony Fisher is a 83 y.o. male with history of mild dementia presenting with L sided weakness. CT showed a R thalamic hemorrhage  ICH:  right thalamic hemorrhage with IVH in setting of hypertensive emergency.   Resultant  left hemiparesis  Code Stroke CT R thalamic hemorrhage with extension into R lateral ventricle. No shift or mass effect.  CTA head and neck unremarkable  MRI - 02/18/2017 - no CAA.  2D Echo  EF 60-65%   LDL 70  HgbA1c 5.5  Heparin subq for VTE prophylaxis Diet NPO time specified  No antithrombotic prior to admission  Ongoing aggressive stroke risk factor management  Therapy recommendations:  CIR vs SNF  Disposition:  pending   Hypertensive Emergency  BP 184/94 on arrival in setting of neurologic deficits  SBP goal < 140  on Cleviprex  Put on lisinopril and amlodipine  Dysphagia   NPO now  Pending speech further evaluation  Consider NG tube if not able to pass swallow  Urinary retention  Frequent I/O  On foley catheter  Dementia  Lives with son at home  Memory difficulty  Able to do ADLs  Other Stroke Risk Factors  Advanced age  UDS negative   Other Active Problems  Hypokalemia - supplement -> 3.4 -> supplement further  NPO - tube feedings  Hospital day # 5  ATTENDING NOTE: Patient was seen and examined by me personally. Documentation reflects findings. The laboratory and radiographic studies reviewed by me. ROS completed by me personally and pertinent positives fully documented.  Patient difficult to understand and lethargic but he reports to be without complaints for the parts of the ROS he can participate with Condition:  stable  Assessment and plan completed by me personally and fully documented above. Plans/Recommendations include:     Ordered CXR in AM to follow-up on previous  CMP and CBC in AM  PT/OT/ST onboard; rehab has been recommended  Will consider melatonin to help  with sleep  SIGNED BY: Dr. Elissa Hefty      To contact Stroke Continuity provider, please refer to [email or web address] After hours, contact General Neurology

## 2017-02-20 NOTE — Progress Notes (Signed)
Occupational Therapy Progress Note - Late entry   Pt demonstrates improved attention to Lt side today - looking to Lt spontaneously.  He demonstrates increase in confusion.  He requires max A - total A for ADLs and max A for functional transfers - continues to push to Lt.    Recommend CIR.   02/19/17 1500  OT Visit Information  Assistance Needed +2  History of Present Illness Pt is a 83 y.o. male who presents from home to ED on 02/15/17 with L-sided weakness. CT shows intraparenchymal hematoma within R thalamus extending into R lateral ventricle. Pertinent PMH includes dementia.   Precautions  Precautions Fall  Cognition  Arousal/Alertness Awake/alert  Behavior During Therapy Flat affect  Overall Cognitive Status Impaired/Different from baseline  Area of Impairment Attention;Memory;Following commands;Safety/judgement;Awareness;Problem solving  Current Attention Level Focused;Sustained  Memory Decreased short-term memory  Following Commands Follows one step commands inconsistently  Safety/Judgement Decreased awareness of safety;Decreased awareness of deficits  Problem Solving Slow processing;Decreased initiation;Difficulty sequencing;Requires verbal cues;Requires tactile cues  General Comments Pt appears more confused at times.  He is highly distractable, and only able to sustain attention to one task at a time for brief perioeds, i.e, he requires min A for EOB sitting statically, and able to assist with righting posture spontaneously.  However, when pt attempts to engage in activity, he fails to initiate righting/postural reactions and falls posteriorly requiring max - total A to maintain sitting - now awareness of LOB during those times   ADL  Overall ADL's  Needs assistance/impaired  Eating/Feeding NPO  Grooming Wash/dry hands;Wash/dry face;Maximal assistance;Total assistance;Sitting  Grooming Details (indicate cue type and reason) sitting EOB.  Loses balance posteriorly   Upper Body  Bathing Total assistance;Sitting  Upper Body Bathing Details (indicate cue type and reason) Pt attempting to assist with donning gown, however, pulls UE out of sleeve then gets tangled up in the gown.  Loses balance posteriorly with no attempts at righting   Toilet Transfer Maximal assistance;Stand-pivot;BSC  Toilet Transfer Details (indicate cue type and reason) assist for forward translation of trunk, and faciliation to lift hips.  requires assist to advance Lt foot, moderate pushing to Lt   Functional mobility during ADLs Maximal assistance  Bed Mobility  Overal bed mobility Needs Assistance  Bed Mobility Supine to Sit  Supine to sit Max assist  General bed mobility comments Pt requires assist to move LEs off bed, he will assist with lifting trunk from bed with facilitation/assist at rib cage   Balance  Overall balance assessment Needs assistance  Sitting-balance support Single extremity supported;Feet supported  Sitting balance-Leahy Scale Poor  Sitting balance - Comments requires min A statically.  Max - total A when he attempts to engage in activity   Postural control Posterior lean;Left lateral lean  Standing balance support Single extremity supported  Standing balance-Leahy Scale Poor  Standing balance comment max A.  Pushes Lt   Transfers  Overall transfer level Needs assistance  Equipment used 1 person hand held assist  Transfers Sit to/from [employer] Transfers  Sit to Stand Max assist  Stand pivot transfers Max assist  General transfer comment assist for forward translation of trunk, and faciliation to lift hips.  requires assist to advance Lt foot, moderate pushing to Lt   OT - End of Session  Equipment Utilized During Treatment Gait belt  Activity Tolerance Patient tolerated treatment well  Patient left in chair;with call bell/phone within reach;with chair alarm set  Nurse Communication Mobility status  OT Assessment/Plan  OT Plan Discharge plan remains  appropriate  OT Visit Diagnosis Hemiplegia and hemiparesis  Hemiplegia - Right/Left Left  Hemiplegia - dominant/non-dominant Non-Dominant  Hemiplegia - caused by Other Nontraumatic intracranial hemorrhage  OT Frequency (ACUTE ONLY) Min 3X/week  Recommendations for Other Services Rehab consult  Follow Up Recommendations CIR;Supervision/Assistance - 24 hour  OT Equipment 3 in 1 bedside commode;Tub/shower bench  AM-PAC OT "6 Clicks" Daily Activity Outcome Measure  Help from another person eating meals? 1  Help from another person taking care of personal grooming? 2  Help from another person toileting, which includes using toliet, bedpan, or urinal? 1  Help from another person bathing (including washing, rinsing, drying)? 2  Help from another person to put on and taking off regular upper body clothing? 2  Help from another person to put on and taking off regular lower body clothing? 2  6 Click Score 10  ADL G Code Conversion CL  OT Time Calculation  OT Start Time (ACUTE ONLY) 1455  OT Stop Time (ACUTE ONLY) 1528  OT Time Calculation (min) 33 min  OT General Charges  $OT Visit 1 Procedure  OT Treatments  $Neuromuscular Re-education 23-37 mins  [facility], OTR/L [phone]

## 2017-02-20 NOTE — Plan of Care (Signed)
Problem: Nutrition: Goal: Adequate nutrition will be maintained Outcome: Progressing Patient has NG tube in right nare infusing Vital 1.2 at 40  mL/hr

## 2017-02-21 ENCOUNTER — Inpatient Hospital Stay (HOSPITAL_COMMUNITY): Payer: Medicare Other

## 2017-02-21 LAB — CBC
HCT: 37 % — ABNORMAL LOW (ref 39.0–52.0)
Hemoglobin: 12.3 g/dL — ABNORMAL LOW (ref 13.0–17.0)
MCH: 32.3 pg (ref 26.0–34.0)
MCHC: 33.2 g/dL (ref 30.0–36.0)
MCV: 97.1 fL (ref 78.0–100.0)
PLATELETS: 218 10*3/uL (ref 150–400)
RBC: 3.81 MIL/uL — AB (ref 4.22–5.81)
RDW: 13.4 % (ref 11.5–15.5)
WBC: 7 10*3/uL (ref 4.0–10.5)

## 2017-02-21 LAB — GLUCOSE, CAPILLARY
GLUCOSE-CAPILLARY: 115 mg/dL — AB (ref 65–99)
GLUCOSE-CAPILLARY: 118 mg/dL — AB (ref 65–99)
GLUCOSE-CAPILLARY: 124 mg/dL — AB (ref 65–99)
GLUCOSE-CAPILLARY: 127 mg/dL — AB (ref 65–99)
Glucose-Capillary: 121 mg/dL — ABNORMAL HIGH (ref 65–99)
Glucose-Capillary: 121 mg/dL — ABNORMAL HIGH (ref 65–99)
Glucose-Capillary: 134 mg/dL — ABNORMAL HIGH (ref 65–99)

## 2017-02-21 LAB — COMPREHENSIVE METABOLIC PANEL
ALBUMIN: 3.3 g/dL — AB (ref 3.5–5.0)
ALT: 26 U/L (ref 17–63)
AST: 43 U/L — AB (ref 15–41)
Alkaline Phosphatase: 45 U/L (ref 38–126)
Anion gap: 9 (ref 5–15)
BUN: 26 mg/dL — ABNORMAL HIGH (ref 6–20)
CO2: 21 mmol/L — ABNORMAL LOW (ref 22–32)
Calcium: 8.8 mg/dL — ABNORMAL LOW (ref 8.9–10.3)
Chloride: 108 mmol/L (ref 101–111)
Creatinine, Ser: 0.8 mg/dL (ref 0.61–1.24)
GFR calc Af Amer: >60 mL/min (ref >60)
GFR calc non Af Amer: >60 mL/min (ref >60)
Glucose, Bld: 132 mg/dL — ABNORMAL HIGH (ref 65–99)
POTASSIUM: 4 mmol/L (ref 3.5–5.1)
SODIUM: 138 mmol/L (ref 135–145)
TOTAL PROTEIN: 6.1 g/dL — AB (ref 6.5–8.1)
Total Bilirubin: 0.9 mg/dL (ref 0.3–1.2)

## 2017-02-21 NOTE — Progress Notes (Signed)
STROKE TEAM PROGRESS NOTE  HPI Anthony Fisher is a 83 y.o. male with a history of mild dementia who presents with left-sided weakness that started earlier today. He apparently was up and around around 8 or 9 AM and was normal at that time. Then "a couple of hours later" he was noticed to be stumbling, and decided that he wanted to lay down. He was then not seen until 7 PM, when they heard a fall and found him with left-sided weakness.  LKW: 8 AM tpa given?: no, ICH ICH Score: 2   SUBJECTIVE (INTERVAL HISTORY) Son and grand-daughter is at bedside. Pt overnight no acute changes. Still lethargic. I explained that the patient may not be able to swallow safely and started the conversation regarding the possible need for PEG if patient fails ST again    OBJECTIVE Temp:  [97.9 F (36.6 C)-99.2 F (37.3 C)] 97.9 F (36.6 C) (03/18 1034) Pulse Rate:  [79-81] 80 (03/18 1034) Cardiac Rhythm: Normal sinus rhythm;Bundle branch block (03/18 0700) Resp:  [20] 20 (03/18 1034) BP: (138-162)/(65-75) 162/75 (03/18 1034) SpO2:  [93 %-96 %] 93 % (03/18 1034)  CBC:   Recent Labs Lab 02/15/17 1924  02/20/17 0356 02/21/17 1415  WBC 5.8  < > 7.7 7.0  NEUTROABS 2.6  --   --   --   HGB 12.9*  < > 12.6* 12.3*  HCT 37.5*  < > 36.6* 37.0*  MCV 96.2  < > 96.8 97.1  PLT 206  < > 185 218  < > = values in this interval not displayed.  Basic Metabolic Panel:   Recent Labs Lab 02/18/17 0446 02/18/17 1822  02/20/17 0356 02/21/17 1415  NA 137  --   < > 137 138  K 3.1*  --   < > 3.4* 4.0  CL 106  --   < > 109 108  CO2 22  --   < > 22 21*  GLUCOSE 125*  --   < > 130* 132*  BUN 13  --   < > 20 26*  CREATININE 0.75  --   < > 0.74 0.80  CALCIUM 8.5*  --   < > 8.7* 8.8*  MG 1.9 2.3  --   --   --   PHOS 2.3* 1.7*  --   --   --   < > = values in this interval not displayed.  Lipid Panel:     Component Value Date/Time   CHOL 131 02/16/2017 0804   TRIG 69 02/16/2017 0804   HDL 47 02/16/2017 0804    CHOLHDL 2.8 02/16/2017 0804   VLDL 14 02/16/2017 0804   LDLCALC 70 02/16/2017 0804   HgbA1c:  Lab Results  Component Value Date   HGBA1C 5.5 02/16/2017   Urine Drug Screen:     Component Value Date/Time   LABOPIA NONE DETECTED 02/19/2017 1040   COCAINSCRNUR NONE DETECTED 02/19/2017 1040   LABBENZ NONE DETECTED 02/19/2017 1040   AMPHETMU NONE DETECTED 02/19/2017 1040   THCU NONE DETECTED 02/19/2017 1040   LABBARB NONE DETECTED 02/19/2017 1040      IMAGING I have personally reviewed the radiological images below and agree with the radiology interpretations.  Dg Chest Port 1 View 02/16/2017 Cardiomegaly. Bibasilar atelectasis.    Dg Chest Port 1 View 02/21/2017 1. Mildly increased mild bibasilar atelectasis. 2. Possible small bilateral pleural effusions. 3. Aortic atherosclerosis.   Ct Head Code Stroke W/o Cm 02/15/2017 1. Intraparenchymal hematoma within the right thalamus  extending into the atrium of the right lateral ventricle.   The location is most consistent with hypertensive hemorrhage.  2. No midline shift or other significant mass effect.  3. Intraventricular blood products may lead to hydrocephalus. Short interval surveillance is warranted.    Ct Angio Head W Or Wo Contrast 02/16/2017  CT HEAD  2.8 x 2 x 2.8 cm right thalamic hemorrhage with overall similar dimensions to the recent exam. Slight increase in degree of surrounding vasogenic edema. Mild mass effect with compression right lateral aspect of the third ventricle. Breakthrough of hemorrhage into right lateral ventricle as previously noted. Tiny amount of intraventricular blood now also noted within the dependent aspect of the left lateral ventricle.    CTA NECK  Carotid arteries are tortuous with medial displacement of internal carotid arteries bilaterally.  No evidence of hemodynamically significant stenosis of either carotid artery.  Mild to slightly moderate narrowing proximal right subclavian  artery. Right vertebral artery is dominant.    CTA HEAD  Calcified plaque with mild narrowing cavernous segment internal carotid artery bilaterally. No significant stenosis carotid terminus, A1 segment or M1 segment of the anterior cerebral artery or middle cerebral artery on either side. No aneurysm or vascular malformation noted. Right vertebral artery is dominant with minimal plaque and narrowing. Left vertebral artery predominantly ends in a posterior inferior cerebellar artery distribution with moderate narrowing at the level of the takeoff of the left posterior inferior cerebellar artery.    MR BRAIN 02/18/2017 Evolving RIGHT thalamic hemorrhage with intraventricular extension. Old RIGHT caudate lacunar. Mild chronic small vessel ischemic disease. Advanced temporal lobe atrophy associated with neurodegenerative disorder.   TTE  Left ventricle: The cavity size was normal. Wall thickness was   increased in a pattern of mild LVH. Systolic function was normal.   The estimated ejection fraction was in the range of 60% to 65%.   Wall motion was normal; there were no regional wall motion   abnormalities. Doppler parameters are consistent with abnormal   left ventricular relaxation (grade 1 diastolic dysfunction). - Aortic valve: There was mild regurgitation. - Left atrium: The atrium was mildly dilated. Impressions: - Normal LV systolic function; grade 1 diastolic dysfunction;   sclerotic aortic valve with mild AI; mld LAE.   PHYSICAL EXAM  Temp:  [97.9 F (36.6 C)-99.2 F (37.3 C)] 97.9 F (36.6 C) (03/18 1034) Pulse Rate:  [79-81] 80 (03/18 1034) Resp:  [20] 20 (03/18 1034) BP: (138-162)/(65-75) 162/75 (03/18 1034) SpO2:  [93 %-96 %] 93 % (03/18 1034)  General - Well nourished, well developed, not in acute distress.  Ophthalmologic - Fundi not visualized due to noncooperative.  Cardiovascular - Regular rate and rhythm.  Neuro - awake, alert, able to open eyes on voice.  Hard of hearing and severe dysarthria. Eyes attending to both sides, PERRL. Left facial droop, tongue in midline, sever dysarthria. RUE 4+/5 at least, LUE 3/5, RLE 4/5 and LLE 4-/5. B/l babinski positive. DTR 1+. Sensation, coordination not cooperative on exam and gait not tested.    ASSESSMENT/PLAN [name] is a 83 y.o. male with history of mild dementia presenting with L sided weakness. CT showed a R thalamic hemorrhage  ICH:  right thalamic hemorrhage with IVH in setting of hypertensive emergency.   Resultant  left hemiparesis  Code Stroke CT R thalamic hemorrhage with extension into R lateral ventricle. No shift or mass effect.  CTA head and neck unremarkable  MRI - 02/18/2017 - no CAA.  2D  Echo  EF 60-65%   LDL 70  HgbA1c 5.5  Heparin subq for VTE prophylaxis Diet NPO time specified  No antithrombotic prior to admission  Ongoing aggressive stroke risk factor management  Therapy recommendations:  CIR vs SNF  Disposition:  pending   Hypertensive Emergency  BP 184/94 on arrival in setting of neurologic deficits  SBP goal < 140  On lisinopril 20 mg BID and amlodipine 10 mg daily - still high.   Dysphagia   NPO now  Pending speech further evaluation  Consider NG tube if not able to pass swallow  Urinary retention  Frequent I/O  On foley catheter  Dementia  Lives with son at home  Memory difficulty  Able to do ADLs  Other Stroke Risk Factors  Advanced age  UDS negative   Other Active Problems  Hypokalemia - supplement -> 3.4 -> 4.0  NPO - tube feedings  Hospital day # 6  ATTENDING NOTE: Patient was seen and examined by me personally. Documentation reflects findings. The laboratory and radiographic studies reviewed by me. ROS completed by me personally and pertinent positives fully documented.  Patient difficult to understand and lethargic but he reports to be without complaints for the parts of the ROS he can participate  with Condition:  stable  Assessment and plan completed by me personally and fully documented above. Plans/Recommendations include:     CXR - Mildly increased mild bibasilar atelectasis. Possible small bilateral pleural effusions today.  Will do follow-up exam in AM  PT/OT/ST;   Disposition planning; PEG discussion if fails ST again  Continue melatonin to help with sleep  To contact Stroke Continuity provider, please refer to [email or web address] After hours, contact General Neurology

## 2017-02-22 ENCOUNTER — Inpatient Hospital Stay (HOSPITAL_COMMUNITY): Payer: Medicare Other

## 2017-02-22 LAB — GLUCOSE, CAPILLARY
GLUCOSE-CAPILLARY: 108 mg/dL — AB (ref 65–99)
GLUCOSE-CAPILLARY: 107 mg/dL — AB (ref 65–99)
Glucose-Capillary: 134 mg/dL — ABNORMAL HIGH (ref 65–99)
Glucose-Capillary: 130 mg/dL — ABNORMAL HIGH (ref 65–99)

## 2017-02-22 MED ORDER — FUROSEMIDE 10 MG/ML PO SOLN
20.0000 mg | Freq: Once | ORAL | Status: AC
  Administered 2017-02-22: 20 mg
  Filled 2017-02-22: qty 2

## 2017-02-22 MED ORDER — RESOURCE THICKENUP CLEAR PO POWD
ORAL | Status: DC | PRN
  Filled 2017-02-22 (×2): qty 125

## 2017-02-22 MED ORDER — PANTOPRAZOLE SODIUM 40 MG PO PACK
40.0000 mg | PACK | Freq: Every day | ORAL | Status: DC
  Administered 2017-02-22 – 2017-02-23 (×2): 40 mg
  Filled 2017-02-22 (×2): qty 20

## 2017-02-22 NOTE — Clinical Social Work Note (Signed)
Clinical Social Work Assessment  Patient Details  Name: Anthony Fisher MRN: [id number] Date of Birth: [birth year]  Date of referral:  02/22/17               Reason for consult:  Facility Placement                Permission sought to share information with:  Facility Sport and exercise psychologist, Family Supports Permission granted to share information::  Yes, Verbal Permission Granted  Name::     [name]  Agency::  SNFs  Relationship::  Daughter  Contact Information:  [phone]  Housing/Transportation Living arrangements for the past 2 months:  Single Family Home Source of Information:  Adult Children Patient Interpreter Needed:  None Criminal Activity/Legal Involvement Pertinent to Current Situation/Hospitalization:  No - Comment as needed Significant Relationships:  Adult Children Lives with:  Self, Adult Children Do you feel safe going back to the place where you live?  No Need for family participation in patient care:  Yes (Comment)  Care giving concerns:  CSW received consult for possible SNF placement at time of discharge if CIR is unable to accept patient. CSW spoke with patient' daughter regarding PT recommendation of SNF placement at time of discharge. Patient's daughter reported that she is currently unable to care for patient at their home given patient's current physical needs and fall risk. Patient's daughter expressed understanding of PT recommendation and is agreeable to SNF placement at time of discharge if CIR is unable to admit him. CSW to continue to follow and assist with discharge planning needs.   Social Worker assessment / plan:  CSW spoke with patient's daughter concerning possibility of rehab at [facility] before returning home.  Employment status:  Retired Nurse, adult PT Recommendations:  [place], [place] / Referral to community resources:  [place]  Patient/Family's Response to  care:  Patient recognizes need for rehab before returning home and is agreeable to a SNF in [place] if CIR is unable to accept patient. CSW provided SNF list.   Patient/Family's Understanding of and Emotional Response to Diagnosis, Current Treatment, and Prognosis:  Patient/family is realistic regarding therapy needs and expressed being hopeful for CIR placement. Patient's daughter would prefer CIR, but is grateful for any resources to help. Patient's daughter expressed understanding of CSW role and discharge process. No questions/concerns about plan or treatment.    Emotional Assessment Appearance:  Appears stated age Attitude/Demeanor/Rapport:  Unable to Assess Affect (typically observed):  Unable to Assess Orientation:  Oriented to Self, Oriented to Place Alcohol / Substance use:  Not Applicable Psych involvement (Current and /or in the community):  No (Comment)  Discharge Needs  Concerns to be addressed:  Care Coordination Readmission within the last 30 days:  No Current discharge risk:  Dependent with Mobility Barriers to Discharge:  Continued Medical Work up   [employer], [place] 02/22/2017, 3:23 PM

## 2017-02-22 NOTE — NC FL2 (Signed)
[place] LEVEL OF CARE SCREENING TOOL     IDENTIFICATION  Patient Name: Anthony Fisher Birthdate: 1926/02/02 Sex: male Admission Date (Current Location): 02/15/2017  [facility] and [place] Number:  Herbalist and Address:  [facility], [place] [address] [place], [place] [address]      Provider Number: [id number]  Attending Physician Name and Address:  Garvin Fila, MD  Relative Name and Phone Number:  Kelby Aline, [phone]    Current Level of Care: SNF Recommended Level of Care: [place] Prior Approval Number:    Date Approved/Denied:   PASRR Number: [id number]  Discharge Plan: SNF    Current Diagnoses: Patient Active Problem List   Diagnosis Date Noted  . ICH (intracerebral hemorrhage) ([place]) 02/16/2017    Orientation RESPIRATION BLADDER Height & Weight     Self  Normal Incontinent Weight:  (bed scale needs to be zero'd out when Pt up) Height:  5\' 9"  (175.3 cm)  BEHAVIORAL SYMPTOMS/MOOD NEUROLOGICAL BOWEL NUTRITION STATUS      Incontinent NG/panda  AMBULATORY STATUS COMMUNICATION OF NEEDS Skin   Extensive Assist Verbally Normal                       Personal Care Assistance Level of Assistance  Bathing, Feeding, Dressing Bathing Assistance: Maximum assistance Feeding assistance: Limited assistance Dressing Assistance: Limited assistance     Functional Limitations Info             SPECIAL CARE FACTORS FREQUENCY  PT (By licensed PT)     PT Frequency: 5x/week              Contractures      Additional Factors Info  Code Status, Allergies Code Status Info: Full Allergies Info: Tape           Current Medications (02/22/2017):  This is the current hospital active medication list Current Facility-Administered Medications  Medication Dose Route Frequency Provider Last Rate Last Dose  . 0.9 %  sodium chloride infusion   Intravenous Continuous Rosalin Hawking, MD 30 mL/hr at  02/21/17 1040    . acetaminophen (TYLENOL) tablet 650 mg  650 mg Oral Q4H PRN Greta Doom, MD       Or  . acetaminophen (TYLENOL) solution 650 mg  650 mg Per Tube Q4H PRN Greta Doom, MD       Or  . acetaminophen (TYLENOL) suppository 650 mg  650 mg Rectal Q4H PRN Greta Doom, MD   650 mg at 02/20/17 0556  . amLODipine (NORVASC) tablet 10 mg  10 mg Oral Daily Rosalin Hawking, MD   10 mg at 02/22/17 0945  . chlorhexidine (PERIDEX) 0.12 % solution 15 mL  15 mL Mouth Rinse BID Veryl Speak, MD   15 mL at 02/22/17 0945  . feeding supplement (VITAL HIGH PROTEIN) liquid 1,000 mL  1,000 mL Per Tube Q24H Rosalin Hawking, MD   1,000 mL at 02/21/17 2126  . heparin injection 5,000 Units  5,000 Units Subcutaneous Q8H Rosalin Hawking, MD   5,000 Units at 02/22/17 9892  . labetalol (NORMODYNE,TRANDATE) injection 10-20 mg  10-20 mg Intravenous Q2H PRN Rosalin Hawking, MD      . lisinopril (PRINIVIL,ZESTRIL) tablet 20 mg  20 mg Oral BID Rosalin Hawking, MD   20 mg at 02/22/17 0945  . MEDLINE mouth rinse  15 mL Mouth Rinse q12n4p Veryl Speak, MD   15 mL at 02/21/17 1200  .  Melatonin TABS 3 mg  3 mg Oral QHS Rachel L Rumbarger, RPH   3 mg at 02/21/17 2125  . multivitamin liquid 15 mL  15 mL Per Tube Daily Rosalin Hawking, MD   15 mL at 02/22/17 0945  . pantoprazole sodium (PROTONIX) 40 mg/20 mL oral suspension 40 mg  40 mg Per Tube Daily Garvin Fila, MD   40 mg at 02/22/17 0945  . polyethylene glycol (MIRALAX / GLYCOLAX) packet 17 g  17 g Oral Daily Rosalin Hawking, MD   17 g at 02/22/17 0945     Discharge Medications: Please see discharge summary for a list of discharge medications.  Relevant Imaging Results:  Relevant Lab Results:   Additional Information SSN: [id number]  Benard Halsted, [place]  I have personally examined this patient, reviewed notes, independently viewed imaging studies, participated in medical decision making and plan of care. I have made any additions or clarifications directly to  the above note. Agree with note above.   Antony Contras, MD Medical Director [facility] Pager: [phone] 02/22/2017 9:54 PM

## 2017-02-22 NOTE — Progress Notes (Signed)
  Speech Language Pathology Treatment: Dysphagia;Cognitive-Linquistic  Patient Details Name: Anthony Fisher MRN: [id number] DOB: 03/28/1926 Today's Date: 02/22/2017 Time: 1017-5102 SLP Time Calculation (min) (ACUTE ONLY): 18 min  Assessment / Plan / Recommendation Clinical Impression  Pt is drowsy but maintains his alertness throughout PO trials with Min cues. He performed oral care with Mod cues for thoroughness and initiation of task. Pt continues to show left-sided anterior spillage and oral holding, but initiates oral transit and swallow with Min-Mod cueing. Mild oral residuals remained after the swallow. His voice is intermittently wet, and he needs Mod cues for more effortful volitional coughing. Given what seems to be improved oral acceptance and swallow response, would recommend proceeding with MBS to better assess oropharyngeal function.    HPI HPI: Pt is a 83 y.o. male who presents from home to ED on 02/15/17 with L-sided weakness. CT shows intraparenchymal hematoma within R thalamus extending into R lateral ventricle. Pertinent PMH includes dementia.       SLP Plan  MBS       Recommendations  Diet recommendations: NPO Medication Administration: Via alternative means                Oral Care Recommendations: Oral care QID Follow up Recommendations: Inpatient Rehab SLP Visit Diagnosis: Attention and concentration deficit;Dysphagia, oropharyngeal phase (R13.12) Attention and concentration deficit following: Nontraumatic intracerebral hemorrhage Plan: MBS       GO                Anthony Fisher 02/22/2017, 11:27 AM  Anthony Fisher, M.A. CCC-SLP [phone]

## 2017-02-22 NOTE — Progress Notes (Signed)
Modified Barium Swallow Progress Note  Patient Details  Name: Anthony Fisher MRN: [id number] Date of Birth: 12/07/1926  Today's Date: 02/22/2017  Modified Barium Swallow completed.  Full report located under Chart Review in the Imaging Section.  Brief recommendations include the following:  Clinical Impression  Pt has a moderate oral and mild pharyngeal dysphagia due to sensorimotor but also cognitive deficits, with Mod cues provided for sustained arousal during study. Pt has limited labial seal that results in anterior loss that is most significant with cup sips of liquids. He has better oral acceptance with use of a straw, although he still has decreased bolus cohesion, weak lingual propulsion, piecemeal swallowing, and premature spillage into the pharynx. As a result, larger volumes of thin liquids are deeply and silently penetrated. Cued cough is too weak to clear penetrates, increasing likelihood of aspiration. Only mild residue remains orally and in the vallecula post-swallow. Recommend initiation of Dys 1 diet and nectar thick liquids by straw. Will continue to follow for tolerance and therapeutic trials of water/ice with SLP.    Swallow Evaluation Recommendations       SLP Diet Recommendations: Dysphagia 1 (Puree) solids;Nectar thick liquid   Liquid Administration via: Straw   Medication Administration: Crushed with puree   Supervision: Staff to assist with self feeding;Full supervision/cueing for compensatory strategies   Compensations: Slow rate;Small sips/bites;Minimize environmental distractions;Follow solids with liquid   Postural Changes: Remain semi-upright after after feeds/meals (Comment);Seated upright at 90 degrees   Oral Care Recommendations: Oral care BID   Other Recommendations: Order thickener from pharmacy;Prohibited food (jello, ice cream, thin soups);Remove water pitcher;Have oral suction available    [name] 02/22/2017,2:38 PM   [name], M.A. CCC-SLP [phone]

## 2017-02-22 NOTE — Care Management Important Message (Signed)
Important Message  Patient Details  Name: Anthony Fisher MRN: [id number] Date of Birth: 09/22/1926   Medicare Important Message Given:  Yes    [name] 02/22/2017, 12:18 PM

## 2017-02-22 NOTE — Progress Notes (Signed)
STROKE TEAM PROGRESS NOTE   SUBJECTIVE (INTERVAL HISTORY) His granddaughter Anthony Fisher is at the bedside. He is up in the geri chair, panda in place for tube feedings.    OBJECTIVE Temp:  [97.6 F (36.4 C)-99.9 F (37.7 C)] 98.1 F (36.7 C) (03/19 0932) Pulse Rate:  [74-79] 79 (03/19 0932) Cardiac Rhythm: Supraventricular tachycardia (03/19 1054) Resp:  [17-19] 17 (03/19 0503) BP: (151-180)/(72-89) 151/77 (03/19 0932) SpO2:  [96 %-99 %] 98 % (03/19 0932)  CBC:   Recent Labs Lab 02/15/17 1924  02/20/17 0356 02/21/17 1415  WBC 5.8  < > 7.7 7.0  NEUTROABS 2.6  --   --   --   HGB 12.9*  < > 12.6* 12.3*  HCT 37.5*  < > 36.6* 37.0*  MCV 96.2  < > 96.8 97.1  PLT 206  < > 185 218  < > = values in this interval not displayed.  Basic Metabolic Panel:   Recent Labs Lab 02/18/17 0446 02/18/17 1822  02/20/17 0356 02/21/17 1415  NA 137  --   < > 137 138  K 3.1*  --   < > 3.4* 4.0  CL 106  --   < > 109 108  CO2 22  --   < > 22 21*  GLUCOSE 125*  --   < > 130* 132*  BUN 13  --   < > 20 26*  CREATININE 0.75  --   < > 0.74 0.80  CALCIUM 8.5*  --   < > 8.7* 8.8*  MG 1.9 2.3  --   --   --   PHOS 2.3* 1.7*  --   --   --   < > = values in this interval not displayed.   PHYSICAL EXAM General - Well nourished, well developed, not in acute distress.  Ophthalmologic - Fundi not visualized due to noncooperative.  Cardiovascular - Regular rate and rhythm.  Neuro - awake, alert, able to open eyes on voice. Hard of hearing and severe dysarthria. Eyes attending to both sides, PERRL. Left facial droop, tongue in midline, sever dysarthria. RUE 4+/5 at least, LUE 3/5, RLE 4/5 and LLE 4-/5. B/l babinski positive. DTR 1+. Sensation, coordination not cooperative on exam and gait not tested.    ASSESSMENT/PLAN Anthony Fisher is a 83 y.o. male with history of mild dementia presenting with L sided weakness. CT showed a R thalamic hemorrhage  ICH:  right thalamic hemorrhage with IVH in  setting of hypertensive emergency.   Resultant  left hemiparesis  Code Stroke CT R thalamic hemorrhage with extension into R lateral ventricle. No shift or mass effect.  CTA head and neck unremarkable  MRI - 02/18/2017 - no CAA.  2D Echo  EF 60-65%   LDL 70  HgbA1c 5.5  Heparin subq for VTE prophylaxis Diet NPO time specified  No antithrombotic prior to admission  Ongoing aggressive stroke risk factor management  Therapy recommendations:  CIR vs SNF  Disposition:  pending   Medically ready for CIR when bed available. If not able to go, will need PEG prior to SNF  Hypertensive Emergency  BP 184/94 on arrival in setting of neurologic deficits  On lisinopril 20 mg BID and amlodipine 10 mg daily - still high.   At SBP goal < 180  Continue to monitor  Dysphagia   NPO now  Has NG for tube feedings  ST following  For MBSS  Urinary retention  Frequent I/O  On foley catheter  Dementia  Lives with son at home  Memory difficulty  Able to do ADLs  Other Stroke Risk Factors  Advanced age  UDS negative   Mild CHF on CXR this am. Persistent mild basilar subsegmental atx - will give lasix 20 mg per tube - repeat CXR in 2 days  Other Active Problems  Hypokalemia - supplement -> 3.4 -> 4.0  Hospital day # [place] for Pager information 02/22/2017 12:38 PM  I have personally examined this patient, reviewed notes, independently viewed imaging studies, participated in medical decision making and plan of care.ROS completed by me personally and pertinent positives fully documented  I have made any additions or clarifications directly to the above note. Agree with note above. I had a long d/w granddaughter about his dysphagia and need for adequate oral nutrition intake prior to transfer to SNF for rehab over next few days and answered questions about his care.Time spent 25 minutes  Antony Contras, MD Medical  Director [facility] Pager: [phone] 02/22/2017 8:42 PM  To contact Stroke Continuity provider, please refer to [email or web address] After hours, contact General Neurology

## 2017-02-22 NOTE — Progress Notes (Signed)
Nutrition Follow-up   INTERVENTION:  D/C TF  Provide Mighty Shake II TID with meals, each supplement provides 480-500 kcals and 20-23 grams of protein  Monitor PO intake for adequacy  NUTRITION DIAGNOSIS:   Inadequate oral intake related to dysphagia as evidenced by NPO status.  Ongoing; diet advanced, but PO adequacy unknown  GOAL:   Patient will meet greater than or equal to 90% of their needs  Unmet  MONITOR:   Diet advancement, TF tolerance  REASON FOR ASSESSMENT:   Consult Enteral/tube feeding initiation and management  ASSESSMENT:   83 y.o. male with history of mild dementia presenting with L sided weakness. CT showed a R thalamic hemorrhage  Pt receiving Vital High Protein @ 40 ml/hr. He was previously receiving cleviprex which was meeting almost 50% of his estimated energy needs. Pt had MBS earlier today and diet has been advanced to Dysphagia 1 with nectar-thick liquids. Pt sleepy, but arousable; states that he isn't very hungry, but he would drink a nutritional shake if he doesn't eat well. Pt and daughter at bedside bot express that they would like feeding tube removed.   Labs reviewed.   Diet Order:  DIET - DYS 1 Room service appropriate? Yes; Fluid consistency: Nectar Thick  Skin:  Reviewed, no issues  Last BM:  3/19  Height:   Ht Readings from Last 1 Encounters:  02/15/17 5\' 9"  (1.753 m)    Weight:   Wt Readings from Last 1 Encounters:  02/19/17 189 lb 9.5 oz (86 kg)    Ideal Body Weight:  72.7 kg  BMI:  Body mass index is 28 kg/m.  Estimated Nutritional Needs:   Kcal:  1600-1800  Protein:  80-95 grams  Fluid:  > 1.6 L/day  EDUCATION NEEDS:   No education needs identified at this time  Scarlette Ar RD, LDN, CSP Inpatient Clinical Dietitian Pager: [phone] After Hours Pager: [phone]

## 2017-02-22 NOTE — Progress Notes (Signed)
Occupational Therapy Treatment Patient Details Name: Anthony Fisher MRN: [id number] DOB: 02-14-1926 Today's Date: 02/22/2017    History of present illness Pt is a 83 y.o. male who presents from home to ED on 02/15/17 with L-sided weakness. CT shows intraparenchymal hematoma within R thalamus extending into R lateral ventricle. Pertinent PMH includes dementia.    OT comments  Pt. Making gains with skilled PT/OT.  Able to assist with bed mobility and stedy transfer this session.  Continued work with sitting balance and completion of grooming tasks.  Pt. Remains excellent CIR candidate.  Will continue to follow acutely.    Follow Up Recommendations  CIR;Supervision/Assistance - 24 hour    Equipment Recommendations  3 in 1 bedside commode;Tub/shower bench    Recommendations for Other Services Rehab consult    Precautions / Restrictions Precautions Precautions: Fall       Mobility Bed Mobility Overal bed mobility: Needs Assistance Bed Mobility: Supine to Sit     Supine to sit: Max assist;HOB elevated     General bed mobility comments: able to reach for L bed rail with R UE and assist with pulling self towards eob, assistance required to bring LLE off of bed, but was able to move RLE with cues  Transfers Overall transfer level: Needs assistance     Sit to Stand: Mod assist;+2 physical assistance         General transfer comment: able to support self seated eob with RUE on center of stedy with max cues to prevent L lean.  LOB present to L but pt. was able to assist with correction with max verbal and tactile guidance.  utilized tray table of LUE support and pt. did better with sitting balance.  when time to attempt sit/stand with steady pt. actively assisted with pushing through bles to assist with coming into standing to place stedy cushions under buttocks.      Balance                                   ADL Overall ADL's : Needs assistance/impaired      Grooming: Brushing hair;Moderate assistance;Sitting               Lower Body Dressing: Maximal assistance;Cueing for compensatory techniques;Cueing for sequencing;Sitting/lateral leans Lower Body Dressing Details (indicate cue type and reason): able to doff left sock with min a but required max a for right sock.  continued encouragement and cues for task completion             Functional mobility during ADLs: Maximal assistance        Vision                     Perception     Praxis      Cognition   Behavior During Therapy: Flat affect Overall Cognitive Status: Impaired/Different from baseline Area of Impairment: Attention;Memory;Following commands;Safety/judgement;Awareness;Problem solving Orientation Level: Disoriented to;Place;Time;Situation Current Attention Level: Focused;Sustained Memory: Decreased short-term memory  Following Commands: Follows one step commands inconsistently Safety/Judgement: Decreased awareness of safety;Decreased awareness of deficits   Problem Solving: Slow processing;Decreased initiation;Difficulty sequencing;Requires verbal cues;Requires tactile cues        Exercises     Shoulder Instructions       General Comments      Pertinent Vitals/ Pain       Pain Assessment: No/denies pain  Home Living  Prior Functioning/Environment              Frequency  Min 3X/week        Progress Toward Goals  OT Goals(current goals can now be found in the care plan section)  Progress towards OT goals: Progressing toward goals     Plan Discharge plan remains appropriate    Co-evaluation    PT/OT/SLP Co-Evaluation/Treatment: Yes Reason for Co-Treatment: For patient/therapist safety;To address functional/ADL transfers   OT goals addressed during session: ADL's and self-care      End of Session Equipment Utilized During Treatment: Other (comment)  (stedy)  OT Visit Diagnosis: Hemiplegia and hemiparesis Hemiplegia - Right/Left: Left Hemiplegia - dominant/non-dominant: Non-Dominant Hemiplegia - caused by: Other Nontraumatic intracranial hemorrhage   Activity Tolerance Patient tolerated treatment well   Patient Left in chair;with call bell/phone within reach;with chair alarm set;with family/visitor present   Nurse Communication          Time: 1020-1046 OT Time Calculation (min): 26 min  Charges: OT General Charges $OT Visit: 1 Procedure OT Treatments $Self Care/Home Management : 8-22 mins   Janice Coffin, COTA/L 02/22/2017, 11:13 AM

## 2017-02-22 NOTE — Progress Notes (Signed)
Physical Therapy Treatment Patient Details Name: Anthony Fisher MRN: [id number] DOB: 1926/06/04 Today's Date: 02/22/2017    History of Present Illness Pt is a 83 y.o. male who presents from home to ED on 02/15/17 with L-sided weakness. CT shows intraparenchymal hematoma within R thalamus extending into R lateral ventricle. Pertinent PMH includes dementia.     PT Comments    Pt performed mobility to edge of bed and OOB transfer with sara stedy frame.  Pt with small movements from LUE/LLE but requires assist on L side for movement and to correct lean/push of trunk to the L.  Pt appears sleepy during session and requires max VCs to maintain focus on task that he is performing.  Will continue to recommend CIR to improve strength and functional mobility before returning home with support from his family.     Follow Up Recommendations  CIR     Equipment Recommendations  None recommended by PT    Recommendations for Other Services Rehab consult     Precautions / Restrictions Precautions Precautions: Fall Restrictions Weight Bearing Restrictions: No    Mobility  Bed Mobility Overal bed mobility: Needs Assistance Bed Mobility: Supine to Sit     Supine to sit: Max assist;HOB elevated;+2 for physical assistance     General bed mobility comments: able to reach for L bed rail with R UE and assist with pulling self towards eob, assistance required to bring LLE off of bed, but was able to move RLE with cues.  Assist provided to elevate trunk into sitting.    Transfers Overall transfer level: Needs assistance Equipment used: Rolling walker (2 wheeled) Transfers: Sit to/from [facility] Sit to Stand: Mod assist;+2 physical assistance (use of chux pad to boost hips into standing.  ) Stand pivot transfers: Total assist;+2 physical assistance (PTA provided assist to maintain trunk in sitting on stedy.  Pt with lateral lean to the L.  )       General transfer comment:  able to support self seated eob with RUE on center of stedy with max cues to prevent L lean.  LOB present to L but pt. was able to assist with correction with max verbal and tactile guidance.  utilized tray table of LUE support and pt. did better with sitting balance.  when time to attempt sit/stand with steady pt. actively assisted with pushing through bles to assist with coming into standing to place stedy cushions under buttocks.    Ambulation/Gait Ambulation/Gait assistance:  (not performed.  )               Stairs            Wheelchair Mobility    Modified Rankin (Stroke Patients Only)       Balance Overall balance assessment: Needs assistance Sitting-balance support: Single extremity supported;Feet supported Sitting balance-Leahy Scale: Poor       Standing balance-Leahy Scale: Poor Standing balance comment: max A.  Pushes Lt                     Cognition Arousal/Alertness: Lethargic Behavior During Therapy: Flat affect Overall Cognitive Status: Impaired/Different from baseline Area of Impairment: Problem solving Orientation Level: Disoriented to;Place;Time;Situation Current Attention Level: Focused;Sustained Memory: Decreased short-term memory Following Commands: Follows one step commands inconsistently Safety/Judgement: Decreased awareness of safety;Decreased awareness of deficits   Problem Solving: Slow processing;Decreased initiation;Difficulty sequencing;Requires verbal cues;Requires tactile cues General Comments: Pt remains distracted but able to make gains during therapy.  Pt requires  max VCs to maintain posture and task during session.      Exercises      General Comments        Pertinent Vitals/Pain Pain Assessment: No/denies pain Faces Pain Scale: No hurt    Home Living                      Prior Function            PT Goals (current goals can now be found in the care plan section) Acute Rehab PT Goals Patient Stated  Goal: Return home Potential to Achieve Goals: Fair Progress towards PT goals: Progressing toward goals    Frequency    Min 4X/week      PT Plan Current plan remains appropriate    Co-evaluation PT/OT/SLP Co-Evaluation/Treatment: Yes Reason for Co-Treatment: Complexity of the patient's impairments (multi-system involvement);Necessary to address cognition/behavior during functional activity;For patient/therapist safety;To address functional/ADL transfers PT goals addressed during session: Mobility/safety with mobility;Balance;Proper use of DME OT goals addressed during session: ADL's and self-care     End of Session Equipment Utilized During Treatment:  (sara stedy and chux pad.  ) Activity Tolerance: Patient limited by lethargy Patient left: in chair;with call bell/phone within reach;with chair alarm set;with family/visitor present Nurse Communication: Mobility status;Need for lift equipment PT Visit Diagnosis: Muscle weakness (generalized) (M62.81);Hemiplegia and hemiparesis;Other abnormalities of gait and mobility (R26.89) Hemiplegia - Right/Left: Left Hemiplegia - dominant/non-dominant: Non-dominant Hemiplegia - caused by: Other Nontraumatic intracranial hemorrhage     Time: 1020-1046 PT Time Calculation (min) (ACUTE ONLY): 26 min  Charges:  $Therapeutic Activity: 8-22 mins                    G Codes:       Cristela Blue 2017/03/09, 11:36 AM Governor Rooks, PTA pager [phone]

## 2017-02-23 ENCOUNTER — Encounter (HOSPITAL_COMMUNITY): Payer: Self-pay | Admitting: Physical Medicine and Rehabilitation

## 2017-02-23 ENCOUNTER — Inpatient Hospital Stay (HOSPITAL_COMMUNITY)
Admission: RE | Admit: 2017-02-23 | Discharge: 2017-03-18 | DRG: 057 | Disposition: A | Discharge status: Skilled Nursing Facility | Payer: Medicare Other | Source: Intra-hospital | Attending: Physical Medicine & Rehabilitation | Admitting: Physical Medicine & Rehabilitation

## 2017-02-23 DIAGNOSIS — I610 Nontraumatic intracerebral hemorrhage in hemisphere, subcortical: Secondary | ICD-10-CM | POA: Diagnosis not present

## 2017-02-23 DIAGNOSIS — Z9841 Cataract extraction status, right eye: Secondary | ICD-10-CM | POA: Diagnosis not present

## 2017-02-23 DIAGNOSIS — I10 Essential (primary) hypertension: Secondary | ICD-10-CM | POA: Diagnosis not present

## 2017-02-23 DIAGNOSIS — D649 Anemia, unspecified: Secondary | ICD-10-CM | POA: Diagnosis present

## 2017-02-23 DIAGNOSIS — I69391 Dysphagia following cerebral infarction: Secondary | ICD-10-CM

## 2017-02-23 DIAGNOSIS — N390 Urinary tract infection, site not specified: Secondary | ICD-10-CM | POA: Diagnosis present

## 2017-02-23 DIAGNOSIS — Z79899 Other long term (current) drug therapy: Secondary | ICD-10-CM | POA: Diagnosis not present

## 2017-02-23 DIAGNOSIS — R339 Retention of urine, unspecified: Secondary | ICD-10-CM | POA: Diagnosis present

## 2017-02-23 DIAGNOSIS — R4189 Other symptoms and signs involving cognitive functions and awareness: Secondary | ICD-10-CM | POA: Diagnosis present

## 2017-02-23 DIAGNOSIS — B964 Proteus (mirabilis) (morganii) as the cause of diseases classified elsewhere: Secondary | ICD-10-CM | POA: Diagnosis present

## 2017-02-23 DIAGNOSIS — R4781 Slurred speech: Secondary | ICD-10-CM | POA: Diagnosis present

## 2017-02-23 DIAGNOSIS — I619 Nontraumatic intracerebral hemorrhage, unspecified: Secondary | ICD-10-CM

## 2017-02-23 DIAGNOSIS — R739 Hyperglycemia, unspecified: Secondary | ICD-10-CM | POA: Diagnosis present

## 2017-02-23 DIAGNOSIS — R7989 Other specified abnormal findings of blood chemistry: Secondary | ICD-10-CM

## 2017-02-23 DIAGNOSIS — I110 Hypertensive heart disease with heart failure: Secondary | ICD-10-CM | POA: Diagnosis present

## 2017-02-23 DIAGNOSIS — W19XXXA Unspecified fall, initial encounter: Secondary | ICD-10-CM

## 2017-02-23 DIAGNOSIS — Z9842 Cataract extraction status, left eye: Secondary | ICD-10-CM

## 2017-02-23 DIAGNOSIS — I69252 Hemiplegia and hemiparesis following other nontraumatic intracranial hemorrhage affecting left dominant side: Secondary | ICD-10-CM | POA: Diagnosis present

## 2017-02-23 DIAGNOSIS — S80219D Abrasion, unspecified knee, subsequent encounter: Secondary | ICD-10-CM | POA: Diagnosis not present

## 2017-02-23 DIAGNOSIS — F0390 Unspecified dementia without behavioral disturbance: Secondary | ICD-10-CM | POA: Diagnosis present

## 2017-02-23 DIAGNOSIS — Z888 Allergy status to other drugs, medicaments and biological substances status: Secondary | ICD-10-CM

## 2017-02-23 DIAGNOSIS — Z961 Presence of intraocular lens: Secondary | ICD-10-CM | POA: Diagnosis present

## 2017-02-23 DIAGNOSIS — I615 Nontraumatic intracerebral hemorrhage, intraventricular: Secondary | ICD-10-CM

## 2017-02-23 DIAGNOSIS — R509 Fever, unspecified: Secondary | ICD-10-CM

## 2017-02-23 DIAGNOSIS — R1310 Dysphagia, unspecified: Secondary | ICD-10-CM | POA: Diagnosis present

## 2017-02-23 DIAGNOSIS — E876 Hypokalemia: Secondary | ICD-10-CM | POA: Diagnosis present

## 2017-02-23 DIAGNOSIS — I509 Heart failure, unspecified: Secondary | ICD-10-CM | POA: Diagnosis present

## 2017-02-23 DIAGNOSIS — I5042 Chronic combined systolic (congestive) and diastolic (congestive) heart failure: Secondary | ICD-10-CM | POA: Diagnosis not present

## 2017-02-23 DIAGNOSIS — I161 Hypertensive emergency: Secondary | ICD-10-CM | POA: Diagnosis present

## 2017-02-23 DIAGNOSIS — G8192 Hemiplegia, unspecified affecting left dominant side: Secondary | ICD-10-CM | POA: Diagnosis not present

## 2017-02-23 DIAGNOSIS — R0989 Other specified symptoms and signs involving the circulatory and respiratory systems: Secondary | ICD-10-CM

## 2017-02-23 DIAGNOSIS — M79609 Pain in unspecified limb: Secondary | ICD-10-CM | POA: Diagnosis not present

## 2017-02-23 LAB — GLUCOSE, CAPILLARY
GLUCOSE-CAPILLARY: 116 mg/dL — AB (ref 65–99)
GLUCOSE-CAPILLARY: 139 mg/dL — AB (ref 65–99)
GLUCOSE-CAPILLARY: 104 mg/dL — AB (ref 65–99)
Glucose-Capillary: 106 mg/dL — ABNORMAL HIGH (ref 65–99)
Glucose-Capillary: 114 mg/dL — ABNORMAL HIGH (ref 65–99)
Glucose-Capillary: 98 mg/dL (ref 65–99)

## 2017-02-23 MED ORDER — CHLORHEXIDINE GLUCONATE 0.12 % MT SOLN
15.0000 mL | Freq: Two times a day (BID) | OROMUCOSAL | Status: DC
  Administered 2017-02-24 – 2017-03-18 (×45): 15 mL via OROMUCOSAL
  Filled 2017-02-23 (×48): qty 15

## 2017-02-23 MED ORDER — DIPHENHYDRAMINE HCL 12.5 MG/5ML PO ELIX
12.5000 mg | ORAL_SOLUTION | Freq: Four times a day (QID) | ORAL | Status: DC | PRN
  Administered 2017-03-16 – 2017-03-17 (×2): 25 mg via ORAL
  Filled 2017-02-23 (×2): qty 10

## 2017-02-23 MED ORDER — HEPARIN SODIUM (PORCINE) 5000 UNIT/ML IJ SOLN
5000.0000 [IU] | Freq: Three times a day (TID) | INTRAMUSCULAR | Status: DC
  Administered 2017-02-24 – 2017-03-18 (×67): 5000 [IU] via SUBCUTANEOUS
  Filled 2017-02-23 (×67): qty 1

## 2017-02-23 MED ORDER — RESOURCE THICKENUP CLEAR PO POWD
ORAL | Status: DC | PRN
  Filled 2017-02-23: qty 125

## 2017-02-23 MED ORDER — GUAIFENESIN-DM 100-10 MG/5ML PO SYRP: 5.0000 mL | ORAL_SOLUTION | Freq: Four times a day (QID) | ORAL | Status: DC | PRN

## 2017-02-23 MED ORDER — ORAL CARE MOUTH RINSE
15.0000 mL | Freq: Two times a day (BID) | OROMUCOSAL | Status: DC
  Administered 2017-02-24 – 2017-03-18 (×38): 15 mL via OROMUCOSAL

## 2017-02-23 MED ORDER — FLEET ENEMA 7-19 GM/118ML RE ENEM: 1.0000 | ENEMA | Freq: Once | RECTAL | Status: DC | PRN

## 2017-02-23 MED ORDER — PROCHLORPERAZINE EDISYLATE 5 MG/ML IJ SOLN: 5.0000 mg | Freq: Four times a day (QID) | INTRAMUSCULAR | Status: DC | PRN

## 2017-02-23 MED ORDER — ACETAMINOPHEN 325 MG PO TABS
325.0000 mg | ORAL_TABLET | ORAL | Status: DC | PRN
  Administered 2017-02-25: 650 mg via ORAL
  Administered 2017-03-04: 325 mg via ORAL
  Filled 2017-02-23 (×4): qty 2
  Filled 2017-02-23: qty 1

## 2017-02-23 MED ORDER — PROCHLORPERAZINE MALEATE 5 MG PO TABS: 5.0000 mg | ORAL_TABLET | Freq: Four times a day (QID) | ORAL | Status: DC | PRN

## 2017-02-23 MED ORDER — PANTOPRAZOLE SODIUM 40 MG PO TBEC
40.0000 mg | DELAYED_RELEASE_TABLET | Freq: Every day | ORAL | Status: DC
  Administered 2017-02-24: 40 mg via ORAL
  Filled 2017-02-23: qty 1

## 2017-02-23 MED ORDER — MELATONIN 3 MG PO TABS
3.0000 mg | ORAL_TABLET | Freq: Every day | ORAL | Status: DC
  Administered 2017-02-24 – 2017-03-17 (×20): 3 mg via ORAL
  Filled 2017-02-23 (×24): qty 1

## 2017-02-23 MED ORDER — TRAZODONE HCL 50 MG PO TABS
25.0000 mg | ORAL_TABLET | Freq: Every evening | ORAL | Status: DC | PRN
  Administered 2017-02-24: 50 mg via ORAL
  Filled 2017-02-23: qty 1

## 2017-02-23 MED ORDER — SENNOSIDES 8.8 MG/5ML PO SYRP
10.0000 mL | ORAL_SOLUTION | Freq: Every day | ORAL | Status: DC
  Administered 2017-02-25 – 2017-03-11 (×12): 10 mL via ORAL
  Filled 2017-02-23 (×24): qty 10

## 2017-02-23 MED ORDER — POLYETHYLENE GLYCOL 3350 17 G PO PACK: 17.0000 g | PACK | Freq: Every day | ORAL | Status: DC | PRN

## 2017-02-23 MED ORDER — LISINOPRIL 20 MG PO TABS
20.0000 mg | ORAL_TABLET | Freq: Two times a day (BID) | ORAL | Status: DC
  Administered 2017-02-24 – 2017-03-18 (×44): 20 mg via ORAL
  Filled 2017-02-23 (×46): qty 1

## 2017-02-23 MED ORDER — ADULT MULTIVITAMIN W/MINERALS CH
1.0000 | ORAL_TABLET | Freq: Every day | ORAL | Status: DC
  Administered 2017-02-24 – 2017-03-18 (×23): 1 via ORAL
  Filled 2017-02-23 (×23): qty 1

## 2017-02-23 MED ORDER — BISACODYL 10 MG RE SUPP: 10.0000 mg | Freq: Every day | RECTAL | Status: DC | PRN

## 2017-02-23 MED ORDER — PROCHLORPERAZINE 25 MG RE SUPP: 12.5000 mg | Freq: Four times a day (QID) | RECTAL | Status: DC | PRN

## 2017-02-23 MED ORDER — AMLODIPINE BESYLATE 10 MG PO TABS
10.0000 mg | ORAL_TABLET | Freq: Every day | ORAL | Status: DC
  Administered 2017-02-25 – 2017-03-17 (×21): 10 mg via ORAL
  Filled 2017-02-23 (×22): qty 1

## 2017-02-23 MED ORDER — NITROGLYCERIN 2 % TD OINT
0.5000 [in_us] | TOPICAL_OINTMENT | Freq: Four times a day (QID) | TRANSDERMAL | Status: DC | PRN
  Filled 2017-02-23: qty 30

## 2017-02-23 MED ORDER — ALUM & MAG HYDROXIDE-SIMETH 200-200-20 MG/5ML PO SUSP: 30.0000 mL | ORAL | Status: DC | PRN

## 2017-02-23 NOTE — Progress Notes (Signed)
Rehab admissions - I met with patient and his daughter.  Family would like inpatient rehab and then home with family.  Patient will have son, daughter, and grandchildren available to assist after rehab.  I have opened the case with [facility] medicare and will await call back from insurance case manager.  Call me for questions.  #[phone]

## 2017-02-23 NOTE — Progress Notes (Signed)
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 right handed male with history of mild dementia. Per chart review patient lives with son who works during the day. Independent prior to admission. One level home with one-step entry. Presented 02/15/2017 with left-sided weakness and slurred speech after being found down by family. Systolic blood pressure 621H. CT of the head showed intraparenchymal hematoma within the right thalamus extending into the atrium of the right lateral ventricle. No midline shift or significant mass effect. CT of the head showed no significant stenosis or aneurysm. CTA of the neck no significant stenosis of either carotid artery. Echocardiogram pending. Maintained on cleviprex for blood pressure control. Currently NPO and await swallow study. Physical therapy evaluation completed 02/16/2017 with recommendations of physical medicine rehabilitation consult.   Review of Systems  Constitutional: Negative for chills and fever.  HENT: Positive for hearing loss. Negative for tinnitus.   Eyes: Negative for blurred vision and double vision.  Respiratory: Negative for cough and shortness of breath.   Cardiovascular: Positive for leg swelling. Negative for chest pain and palpitations.  Gastrointestinal: Positive for constipation. Negative for nausea and vomiting.  Genitourinary: Positive for urgency. Negative for dysuria and hematuria.  Musculoskeletal: Positive for falls.  Skin: Negative for rash.   Neurological: Positive for weakness. Negative for seizures.  Psychiatric/Behavioral: Positive for memory loss.  All other systems reviewed and are negative.      Past Medical History:  Diagnosis Date  . Dementia    History reviewed. No pertinent surgical history. History reviewed. No pertinent family history. Social History:  reports that he has never smoked. He has never used smokeless tobacco. He reports that he does not drink alcohol or use drugs. Allergies:       Allergies  Allergen Reactions  . Tape Other (See Comments)    SKIN IS THIN (MAY TEAR AND/OR BRUISE EASILY)         Medications Prior to Admission  Medication Sig Dispense Refill  . polyethylene glycol powder (GLYCOLAX/MIRALAX) powder Take 17 g by mouth daily.      Home: Home Living Family/patient expects to be discharged to:: Private residence Living Arrangements: Children Available Help at Discharge: Family, Available 24 hours/day Type of Home: House Home Access: Stairs to enter [employer] of Steps: 1 Entrance Stairs-Rails: None Home Layout: One level Home Equipment: None  Functional History: Prior Function Level of Independence: Independent Comments: Son reports pt indep with all functional mobility and amb; sedentary lifestyle.  Functional Status:  Mobility: Bed Mobility Overal bed mobility: Needs Assistance Bed Mobility: Supine to Sit Supine to sit: Max assist General bed mobility comments: MaxA for trunk support and to maneuver LLE out of bed. Transfers Overall transfer level: Needs assistance Equipment used: None Transfers: Sit to/from Stand, Stand Pivot Transfers Sit to Stand: +2 physical assistance, Max assist Stand pivot transfers: +2 physical assistance, Max assist  ADL:  Cognition: Cognition Overall Cognitive Status: History of cognitive impairments - at baseline Orientation Level: Oriented to person, Oriented to place Cognition Arousal/Alertness:  Awake/alert Behavior  During Therapy: WFL for tasks assessed/performed Overall Cognitive Status: History of cognitive impairments - at baseline Area of Impairment: Orientation, Attention, Memory, Following commands, Awareness, Problem solving Orientation Level: Disoriented to, Place, Time, Situation Current Attention Level: Sustained Memory: Decreased short-term memory Following Commands: Follows multi-step commands inconsistently, Follows one step commands with increased time Awareness: Intellectual Problem Solving: Slow processing, Requires verbal cues, Requires tactile cues General Comments: Pt A&O to name, birthday, and names of family members present. Family reports dementia with short term memory deficits at baseline; awareness seemed to improve somewhat when family was present.   Blood pressure (!) 119/59, pulse 64, temperature 98.1 F (36.7 C), temperature source Oral, resp. rate 20, height 5\' 9"  (1.753 m), weight 82.2 kg (181 lb 3.5 oz), SpO2 95 %. Physical Exam  HENT:  Head: Normocephalic.  Oral mucosa white with caked on debris along tongue as well  Eyes: EOM are normal.  Neck: Normal range of motion. Neck supple. No thyromegaly present.  Cardiovascular: Normal rate and regular rhythm.   Respiratory: Effort normal and breath sounds normal. No respiratory distress.  GI: Soft. Bowel sounds are normal. He exhibits no distension.  Neurological: He exhibits abnormal muscle tone.  Lethargic but arousable. Speech is very dysarthric to the point that he is generally unintelligible. Left facial weakness. He can provide his name and age. Identified my two fingers. Had difficulties following simple one step commands. LUE 0/5. LLE grossly 1 to 1+/5. No pain sense LUE and perhaps mild pain sense/withdrawal in the LLE. Moves rue and rle grossly 3-4/5.   Skin: Skin is warm and dry.    Lab Results Last 24 Hours  Results for orders placed or performed during the hospital encounter of  02/15/17 (from the past 24 hour(s))  CBG monitoring, ED     Status: Abnormal   Collection Time: 02/15/17  7:08 PM  Result Value Ref Range   Glucose-Capillary 124 (H) 65 - 99 mg/dL  I-stat troponin, ED (not at [facility], [facility])     Status: None   Collection Time: 02/15/17  7:13 PM  Result Value Ref Range   Troponin i, poc 0.01 0.00 - 0.08 ng/mL   Comment 3          I-Stat Chem 8, ED  (not at [facility], [facility])     Status: Abnormal   Collection Time: 02/15/17  7:15 PM  Result Value Ref Range   Sodium 142 135 - 145 mmol/L   Potassium 3.6 3.5 - 5.1 mmol/L   Chloride 106 101 - 111 mmol/L   BUN 18 6 - 20 mg/dL   Creatinine, Ser 1.00 0.61 - 1.24 mg/dL   Glucose, Bld 132 (H) 65 - 99 mg/dL   Calcium, Ion 1.14 (L) 1.15 - 1.40 mmol/L   TCO2 25 0 - 100 mmol/L   Hemoglobin 11.9 (L) 13.0 - 17.0 g/dL   HCT 35.0 (L) 39.0 - 52.0 %  Ethanol     Status: None   Collection Time: 02/15/17  7:24 PM  Result Value Ref Range   Alcohol, Ethyl (B) <5 <5 mg/dL  Protime-INR     Status: None   Collection Time: 02/15/17  7:24 PM  Result Value Ref Range   Prothrombin Time 13.9 11.4 - 15.2 seconds   INR 1.07   APTT     Status: None   Collection Time: 02/15/17  7:24 PM  Result Value Ref Range   aPTT 27 24 - 36 seconds  CBC     Status: Abnormal  Collection Time: 02/15/17  7:24 PM  Result Value Ref Range   WBC 5.8 4.0 - 10.5 K/uL   RBC 3.90 (L) 4.22 - 5.81 MIL/uL   Hemoglobin 12.9 (L) 13.0 - 17.0 g/dL   HCT 37.5 (L) 39.0 - 52.0 %   MCV 96.2 78.0 - 100.0 fL   MCH 33.1 26.0 - 34.0 pg   MCHC 34.4 30.0 - 36.0 g/dL   RDW 12.8 11.5 - 15.5 %   Platelets 206 150 - 400 K/uL  Differential     Status: None   Collection Time: 02/15/17  7:24 PM  Result Value Ref Range   Neutrophils Relative % 44 %   Neutro Abs 2.6 1.7 - 7.7 K/uL   Lymphocytes Relative 39 %   Lymphs Abs 2.3 0.7 - 4.0 K/uL   Monocytes Relative 13 %   Monocytes Absolute 0.8 0.1 - 1.0 K/uL   Eosinophils Relative 3 %     Eosinophils Absolute 0.2 0.0 - 0.7 K/uL   Basophils Relative 1 %   Basophils Absolute 0.1 0.0 - 0.1 K/uL  Comprehensive metabolic panel     Status: Abnormal   Collection Time: 02/15/17  7:24 PM  Result Value Ref Range   Sodium 141 135 - 145 mmol/L   Potassium 3.6 3.5 - 5.1 mmol/L   Chloride 108 101 - 111 mmol/L   CO2 23 22 - 32 mmol/L   Glucose, Bld 133 (H) 65 - 99 mg/dL   BUN 16 6 - 20 mg/dL   Creatinine, Ser 1.08 0.61 - 1.24 mg/dL   Calcium 9.2 8.9 - 10.3 mg/dL   Total Protein 6.2 (L) 6.5 - 8.1 g/dL   Albumin 3.9 3.5 - 5.0 g/dL   AST 18 15 - 41 U/L   ALT 8 (L) 17 - 63 U/L   Alkaline Phosphatase 63 38 - 126 U/L   Total Bilirubin 0.6 0.3 - 1.2 mg/dL   GFR calc non Af Amer 59 (L) >60 mL/min   GFR calc Af Amer >60 >60 mL/min   Anion gap 10 5 - 15  MRSA PCR Screening     Status: None   Collection Time: 02/15/17  8:30 PM  Result Value Ref Range   MRSA by PCR NEGATIVE NEGATIVE  CBC     Status: Abnormal   Collection Time: 02/16/17  8:04 AM  Result Value Ref Range   WBC 9.2 4.0 - 10.5 K/uL   RBC 3.88 (L) 4.22 - 5.81 MIL/uL   Hemoglobin 12.8 (L) 13.0 - 17.0 g/dL   HCT 37.5 (L) 39.0 - 52.0 %   MCV 96.6 78.0 - 100.0 fL   MCH 33.0 26.0 - 34.0 pg   MCHC 34.1 30.0 - 36.0 g/dL   RDW 13.1 11.5 - 15.5 %   Platelets 199 150 - 400 K/uL  Basic metabolic panel     Status: Abnormal   Collection Time: 02/16/17  8:04 AM  Result Value Ref Range   Sodium 140 135 - 145 mmol/L   Potassium 3.3 (L) 3.5 - 5.1 mmol/L   Chloride 110 101 - 111 mmol/L   CO2 22 22 - 32 mmol/L   Glucose, Bld 129 (H) 65 - 99 mg/dL   BUN 15 6 - 20 mg/dL   Creatinine, Ser 0.99 0.61 - 1.24 mg/dL   Calcium 8.5 (L) 8.9 - 10.3 mg/dL   GFR calc non Af Amer >60 >60 mL/min   GFR calc Af Amer >60 >60 mL/min   Anion gap 8 5 -  15  Vitamin B12     Status: None   Collection Time: 02/16/17  8:04 AM  Result Value Ref Range   Vitamin B-12 277 180 - 914 pg/mL  Lipid panel      Status: None   Collection Time: 02/16/17  8:04 AM  Result Value Ref Range   Cholesterol 131 0 - 200 mg/dL   Triglycerides 69 <150 mg/dL   HDL 47 >40 mg/dL   Total CHOL/HDL Ratio 2.8 RATIO   VLDL 14 0 - 40 mg/dL   LDL Cholesterol 70 0 - 99 mg/dL  TSH     Status: None   Collection Time: 02/16/17  8:04 AM  Result Value Ref Range   TSH 1.063 0.350 - 4.500 uIU/mL      Imaging Results (Last 48 hours)  Ct Angio Head W Or Wo Contrast  Result Date: 02/16/2017 CLINICAL DATA:  83 year old male with intracranial hemorrhage. Dementia. Subsequent encounter. EXAM: CT ANGIOGRAPHY HEAD AND NECK TECHNIQUE: Multidetector CT imaging of the head and neck was performed using the standard protocol during bolus administration of intravenous contrast. Multiplanar CT image reconstructions and MIPs were obtained to evaluate the vascular anatomy. Carotid stenosis measurements (when applicable) are obtained utilizing NASCET criteria, using the distal internal carotid diameter as the denominator. CONTRAST:  50 cc Isovue 370. COMPARISON:  None. FINDINGS: CT HEAD FINDINGS Brain: 2.8 x 2 x 2.8 cm right thalamic hemorrhage with overall similar dimensions to the recent exam. Slight increase in degree of surrounding vasogenic edema. Mild mass effect with compression right lateral aspect of the third ventricle. Breakthrough of hemorrhage into right lateral ventricle as previously noted. Tiny amount of intraventricular blood now also noted within the dependent aspect of the left lateral ventricle. No intracranial enhancing lesion seen separate from the thalamic hemorrhage. No acute thrombotic infarct. Global atrophy without hydrocephalus. Right temporal horn slightly larger than the left unchanged from 11/12/2016. Chronic microvascular changes. Vascular: As below. Skull: Negative. Sinuses: Minimal mucosal thickening right maxillary sinus. Orbits: Post lens replacement. Minimal exophthalmos. No acute orbital abnormality.  Review of the MIP images confirms the above findings CTA NECK FINDINGS Aortic arch: 3 vessel arch with calcified plaque. Right carotid system: Tortuous right carotid artery with medial displacement. No hemodynamically significant stenosis of the right common carotid artery or right internal carotid artery. Ectatic distal cervical segment of the right internal carotid artery. Left carotid system: No significant abnormality left common carotid artery. No significant stenosis of the left internal carotid artery. Medial displacement left internal carotid artery. Mild fold at the level of maximal medial displacement. Vertebral arteries: Plaques with mild to slightly moderate narrowing proximal right subclavian artery. Minimal plaque origin right vertebral artery without significant narrowing. Right vertebral artery is dominant. Minimal plaque origin non dominant left vertebral artery without significant narrowing. Skeleton: Cervical spondylotic changes most notable C6-7.  Caries. Other neck: No worrisome mass. Upper chest: Mild dependent atelectasis. Review of the MIP images confirms the above findings CTA HEAD FINDINGS Anterior circulation: Calcified plaque with mild narrowing cavernous segment internal carotid artery bilaterally. No abnormal vessels extend to the right thalamic hemorrhage. No significant stenosis carotid terminus, A1 segment or M1 segment of the anterior cerebral artery middle cerebral artery on either side. No aneurysm or vascular malformation noted. Posterior circulation: Right vertebral artery is dominant with minimal plaque and narrowing. Left vertebral artery predominantly ends in a posterior inferior cerebellar artery distribution with moderate narrowing at the level of the takeoff of the left posterior inferior cerebellar artery.  Ectatic basilar artery without significant stenosis. Venous sinuses: Patent. Anatomic variants: None Delayed phase: As above. Review of the MIP images confirms the  above findings IMPRESSION: CT HEAD 2.8 x 2 x 2.8 cm right thalamic hemorrhage with overall similar dimensions to the recent exam. Slight increase in degree of surrounding vasogenic edema. Mild mass effect with compression right lateral aspect of the third ventricle. Breakthrough of hemorrhage into right lateral ventricle as previously noted. Tiny amount of intraventricular blood now also noted within the dependent aspect of the left lateral ventricle. CTA NECK Carotid arteries are tortuous with medial displacement of internal carotid arteries bilaterally. No evidence of hemodynamically significant stenosis of either carotid artery. Mild to slightly moderate narrowing proximal right subclavian artery. Right vertebral artery is dominant. CTA HEAD Calcified plaque with mild narrowing cavernous segment internal carotid artery bilaterally. No significant stenosis carotid terminus, A1 segment or M1 segment of the anterior cerebral artery or middle cerebral artery on either side. No aneurysm or vascular malformation noted. Right vertebral artery is dominant with minimal plaque and narrowing. Left vertebral artery predominantly ends in a posterior inferior cerebellar artery distribution with moderate narrowing at the level of the takeoff of the left posterior inferior cerebellar artery. Electronically Signed   By: Genia Del M.D.   On: 02/16/2017 10:42   Ct Angio Neck W Or Wo Contrast  Result Date: 02/16/2017 CLINICAL DATA:  83 year old male with intracranial hemorrhage. Dementia. Subsequent encounter. EXAM: CT ANGIOGRAPHY HEAD AND NECK TECHNIQUE: Multidetector CT imaging of the head and neck was performed using the standard protocol during bolus administration of intravenous contrast. Multiplanar CT image reconstructions and MIPs were obtained to evaluate the vascular anatomy. Carotid stenosis measurements (when applicable) are obtained utilizing NASCET criteria, using the distal internal carotid diameter as the  denominator. CONTRAST:  50 cc Isovue 370. COMPARISON:  None. FINDINGS: CT HEAD FINDINGS Brain: 2.8 x 2 x 2.8 cm right thalamic hemorrhage with overall similar dimensions to the recent exam. Slight increase in degree of surrounding vasogenic edema. Mild mass effect with compression right lateral aspect of the third ventricle. Breakthrough of hemorrhage into right lateral ventricle as previously noted. Tiny amount of intraventricular blood now also noted within the dependent aspect of the left lateral ventricle. No intracranial enhancing lesion seen separate from the thalamic hemorrhage. No acute thrombotic infarct. Global atrophy without hydrocephalus. Right temporal horn slightly larger than the left unchanged from 11/12/2016. Chronic microvascular changes. Vascular: As below. Skull: Negative. Sinuses: Minimal mucosal thickening right maxillary sinus. Orbits: Post lens replacement. Minimal exophthalmos. No acute orbital abnormality. Review of the MIP images confirms the above findings CTA NECK FINDINGS Aortic arch: 3 vessel arch with calcified plaque. Right carotid system: Tortuous right carotid artery with medial displacement. No hemodynamically significant stenosis of the right common carotid artery or right internal carotid artery. Ectatic distal cervical segment of the right internal carotid artery. Left carotid system: No significant abnormality left common carotid artery. No significant stenosis of the left internal carotid artery. Medial displacement left internal carotid artery. Mild fold at the level of maximal medial displacement. Vertebral arteries: Plaques with mild to slightly moderate narrowing proximal right subclavian artery. Minimal plaque origin right vertebral artery without significant narrowing. Right vertebral artery is dominant. Minimal plaque origin non dominant left vertebral artery without significant narrowing. Skeleton: Cervical spondylotic changes most notable C6-7.  Caries. Other neck:  No worrisome mass. Upper chest: Mild dependent atelectasis. Review of the MIP images confirms the above findings CTA HEAD FINDINGS Anterior  circulation: Calcified plaque with mild narrowing cavernous segment internal carotid artery bilaterally. No abnormal vessels extend to the right thalamic hemorrhage. No significant stenosis carotid terminus, A1 segment or M1 segment of the anterior cerebral artery middle cerebral artery on either side. No aneurysm or vascular malformation noted. Posterior circulation: Right vertebral artery is dominant with minimal plaque and narrowing. Left vertebral artery predominantly ends in a posterior inferior cerebellar artery distribution with moderate narrowing at the level of the takeoff of the left posterior inferior cerebellar artery. Ectatic basilar artery without significant stenosis. Venous sinuses: Patent. Anatomic variants: None Delayed phase: As above. Review of the MIP images confirms the above findings IMPRESSION: CT HEAD 2.8 x 2 x 2.8 cm right thalamic hemorrhage with overall similar dimensions to the recent exam. Slight increase in degree of surrounding vasogenic edema. Mild mass effect with compression right lateral aspect of the third ventricle. Breakthrough of hemorrhage into right lateral ventricle as previously noted. Tiny amount of intraventricular blood now also noted within the dependent aspect of the left lateral ventricle. CTA NECK Carotid arteries are tortuous with medial displacement of internal carotid arteries bilaterally. No evidence of hemodynamically significant stenosis of either carotid artery. Mild to slightly moderate narrowing proximal right subclavian artery. Right vertebral artery is dominant. CTA HEAD Calcified plaque with mild narrowing cavernous segment internal carotid artery bilaterally. No significant stenosis carotid terminus, A1 segment or M1 segment of the anterior cerebral artery or middle cerebral artery on either side. No aneurysm or  vascular malformation noted. Right vertebral artery is dominant with minimal plaque and narrowing. Left vertebral artery predominantly ends in a posterior inferior cerebellar artery distribution with moderate narrowing at the level of the takeoff of the left posterior inferior cerebellar artery. Electronically Signed   By: Genia Del M.D.   On: 02/16/2017 10:42   Dg Chest Port 1 View  Result Date: 02/16/2017 CLINICAL DATA:  Stroke, cough. EXAM: PORTABLE CHEST 1 VIEW COMPARISON:  05/09/2009 FINDINGS: Mild cardiomegaly. Low volumes. Bibasilar atelectasis. No definite pleural effusion. No pneumothorax. Normal vascularity. IMPRESSION: Cardiomegaly. Bibasilar atelectasis. Electronically Signed   By: Marybelle Killings M.D.   On: 02/16/2017 07:28   Ct Head Code Stroke W/o Cm  Result Date: 02/15/2017 CLINICAL DATA:  Code stroke.  Unresponsive patient EXAM: CT HEAD WITHOUT CONTRAST TECHNIQUE: Contiguous axial images were obtained from the base of the skull through the vertex without intravenous contrast. COMPARISON:  Head CT 11/12/2016 FINDINGS: Brain: There is an intraparenchymal hematoma within the right thalamus extending into the right lateral ventricle atrium. The hematoma measures 2.0 x 2.8 x 2.8 cm. The CSF spaces remain widely patent without hydrocephalus. There is no midline shift or significant mass effect. Vascular: Atherosclerotic calcification of the vertebral and internal carotid arteries at the skull base. Skull: Normal Sinuses/Orbits: Mild bilateral maxillary mucosal thickening. Normal orbits. Other: None IMPRESSION: 1. Intraparenchymal hematoma within the right thalamus extending into the atrium of the right lateral ventricle. The location is most consistent with hypertensive hemorrhage. 2. No midline shift or other significant mass effect. 3. Intraventricular blood products may lead to hydrocephalus. Short interval surveillance is warranted. Critical Value/emergent results were called by telephone  at the time of interpretation on 02/15/2017 at 7:24 pm to Dr. Veryl Speak , who verbally acknowledged these results. Electronically Signed   By: Ulyses Jarred M.D.   On: 02/15/2017 19:28     Assessment/Plan: Diagnosis:  Left hemiparesis and hemisensory deficits as well as profound communication and swallowing dysfunction due to right thalamic  ICH 1. Does the need for close, 24 hr/day medical supervision in concert with the patient's rehab needs make it unreasonable for this patient to be served in a less intensive setting? Yes 2. Co-Morbidities requiring supervision/potential complications: mild dementia 3. Due to bladder management, bowel management, safety, skin/wound care, disease management, medication administration, pain management and patient education, does the patient require 24 hr/day rehab nursing? Yes 4. Does the patient require coordinated care of a physician, rehab nurse, PT (1-2 hrs/day, 5 days/week), OT (1-2 hrs/day, 5 days/week) and SLP (1-2 hrs/day, 5 days/week) to address physical and functional deficits in the context of the above medical diagnosis(es)? Yes Addressing deficits in the following areas: balance, endurance, locomotion, strength, transferring, bowel/bladder control, bathing, dressing, feeding, grooming, toileting, cognition, speech, language, swallowing and psychosocial support 5. Can the patient actively participate in an intensive therapy program of at least 3 hrs of therapy per day at least 5 days per week? Potentially 6. The potential for patient to make measurable gains while on inpatient rehab is good 7. Anticipated functional outcomes upon discharge from inpatient rehab are min assist and mod assist  with PT, min assist and mod assist with OT, min assist and mod assist with SLP. 8. Estimated rehab length of stay to reach the above functional goals is: 14-17 days 9. Does the patient have adequate social supports and living environment to accommodate these  discharge functional goals? Potentially 10. Anticipated D/C setting: Other 11. Anticipated post D/C treatments: N/A 12. Overall Rehab/Functional Prognosis: good  RECOMMENDATIONS: This patient's condition is appropriate for continued rehabilitative care in the following setting: CIR Patient has agreed to participate in recommended program. N/A Note that insurance prior authorization may be required for reimbursement for recommended care.  Comment: I don't believe this patient's social supports can meet his expected discharge needs from inpatient rehab. However, it would be beneficial to improve patient's swallowing, communication, and functional mobility to decrease his burden of care at the next level of care. He is at high risk for aspiration event and nutritional deficits given his neurological condition and age. Rehab Admissions Coordinator to follow up.  Thanks,  Meredith Staggers, MD, Mellody Drown    Cathlyn Parsons., PA-C 02/16/2017    Revision History

## 2017-02-23 NOTE — H&P (Signed)
Physical Medicine and Rehabilitation Admission H&P    Chief Complaint  Patient presents with  . Left sided weakness, confusion, slurred speech and dysphagia    HPI:  Anthony Fisher is a 83 y.o. male with a history of dementia who was admitted on 02/15/17 with left-sided weakness, unsteady gait and fall. 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 CTA head/neck negative for stenosis, aneurysm or vascular malformation. MRI brain done revealing evolving right thalamic hemorrhage with old right caudate lacunar and advanced temporal lobe atropy--no CAA.  2 D echo with EF 60-65% with mild LVH and no motion abnormality. Labile blood pressures improving with adjustment of medications.  Patient with dysphagia due to sensorimotor as well as cognitive deficits--has been on tube feed for nutritional support.  Foley placed on 3/14 due to urinary retention needing in and out caths. MBS done 3/19 with initiation of dysphagia 1, nectar liquids. Lethargy resolving with improvement in participation. Patient with resultant left sided weakness, decreased balance at EOB, cognitive deficits needing needs max verbal and tactile cues to follow simple commands. CIR recommended for follow up therapy.    Review of Systems  Unable to perform ROS: Mental acuity  Eyes: Positive for blurred vision.  Respiratory: Negative for shortness of breath.   Cardiovascular: Negative for chest pain.  Gastrointestinal: Negative for abdominal pain.  Musculoskeletal: Positive for neck pain (family reports decreased ROM ). Negative for back pain.  Neurological: Positive for speech change and focal weakness.  Psychiatric/Behavioral: Positive for memory loss. The patient has insomnia (Snacks at night/sleeps on and off during the day).       Past Medical History:  Diagnosis Date  . Adenomatous polyp of colon   . Dementia     Past Surgical History:    Procedure Laterality Date  . CATARACT EXTRACTION W/ INTRAOCULAR LENS  IMPLANT, BILATERAL        Family History : Unable to state.     Social History:  Lives with family. Independent PTA--goes out to breakfast with daughter daily. Sleeps on and off during the day. Per  reports that he has never smoked. He has never used smokeless tobacco. He reports that he does not drink alcohol or use drugs.    Allergies  Allergen Reactions  . Tape Other (See Comments)    SKIN IS THIN (MAY TEAR AND/OR BRUISE EASILY)    Medications Prior to Admission  Medication Sig Dispense Refill  . polyethylene glycol powder (GLYCOLAX/MIRALAX) powder Take 17 g by mouth daily.      Home: Home Living Family/patient expects to be discharged to:: Private residence Living Arrangements: Children Available Help at Discharge: Family, Available 24 hours/day Type of Home: House Home Access: Stairs to enter Technical brewer of Steps: 1 Entrance Stairs-Rails: None Home Layout: One level Home Equipment: None  Lives With: Family   Functional History: Prior Function Level of Independence: Independent Comments: Pt was very active and independent PTA   Functional Status:  Mobility: Bed Mobility Overal bed mobility: Needs Assistance Bed Mobility: Sidelying to Sit, Rolling Rolling: Max assist, +2 for physical assistance (rolling to the R.  ) Sidelying to sit: Max assist (assist for LE advancement and elevation of trunk.  ) Supine to sit: Max assist, HOB elevated, +2 for physical assistance General bed mobility comments: Pt required cues for sequencing and assist to move LEs.  pt able to sit edge of bed unassisted intermittently but noticeable LOB  posterior and Lateral to the R.   Transfers Overall transfer level: Needs assistance Equipment used:  (sara stedy) Transfer via Lift Equipment: Stedy Transfers: Sit to/from Stand, Risk manager Sit to Stand: Mod assist, +2 physical assistance (lateral  lean to the L.  ) Stand pivot transfers: +2 physical assistance, Max assist (to facilitate trunk control within the stedy.  ) General transfer comment: able to support self seated eob with RUE on bed with max cues to prevent L lean.  LOB present to L and posterior but pt. was able to assist with correction with max verbal and tactile guidance.  Pt actively assisted with pushing through bles to assist with coming into standing to place stedy cushions under buttocks.  Pt required facilitation for weight shifting to the R.   Ambulation/Gait Ambulation/Gait assistance:  (not performed.  ) Ambulation Distance (Feet): 5 Feet Assistive device: None Gait Pattern/deviations: Decreased weight shift to right, Leaning posteriorly, Narrow base of support, Step-to pattern, Decreased step length - right, Decreased step length - left General Gait Details: Amb with max +2 "three musketeer" technique; pt able to initiate step with RLE after facilitation of weight shift.     ADL: ADL Overall ADL's : Needs assistance/impaired Eating/Feeding: NPO Grooming: Brushing hair, Moderate assistance, Sitting Grooming Details (indicate cue type and reason): sitting EOB.  Loses balance posteriorly  Upper Body Bathing: Total assistance, Sitting Upper Body Bathing Details (indicate cue type and reason): Pt attempting to assist with donning gown, however, pulls UE out of sleeve then gets tangled up in the gown.  Loses balance posteriorly with no attempts at righting  Lower Body Bathing: Maximal assistance, Sit to/from stand Upper Body Dressing : Maximal assistance, Sitting Lower Body Dressing: Maximal assistance, Cueing for compensatory techniques, Cueing for sequencing, Sitting/lateral leans Lower Body Dressing Details (indicate cue type and reason): able to doff left sock with min a but required max a for right sock.  continued encouragement and cues for task completion Toilet Transfer: Maximal assistance, Stand-pivot,  [facility] Toilet Transfer Details (indicate cue type and reason): assist for forward translation of trunk, and faciliation to lift hips.  requires assist to advance Lt foot, moderate pushing to Lt  Toileting- Clothing Manipulation and Hygiene: Total assistance, Sit to/from stand Functional mobility during ADLs: Maximal assistance  Cognition: Cognition Overall Cognitive Status: Within Functional Limits for tasks assessed Arousal/Alertness: Awake/alert Orientation Level: Oriented to person Attention: Sustained Sustained Attention: Impaired Sustained Attention Impairment: Verbal basic Memory: Impaired Memory Impairment: Decreased recall of new information Awareness: Impaired Awareness Impairment: Intellectual impairment, Emergent impairment Problem Solving: Impaired Problem Solving Impairment: Functional basic Safety/Judgment: Impaired Cognition Arousal/Alertness: Awake/alert Behavior During Therapy: WFL for tasks assessed/performed Overall Cognitive Status: Within Functional Limits for tasks assessed Area of Impairment: Problem solving Orientation Level: Disoriented to, Place, Time, Situation Current Attention Level: Focused, Sustained Memory: Decreased short-term memory Following Commands: Follows one step commands inconsistently Safety/Judgement: Decreased awareness of safety, Decreased awareness of deficits Awareness: Intellectual Problem Solving: Slow processing, Decreased initiation, Difficulty sequencing, Requires verbal cues, Requires tactile cues General Comments: Pt remains distracted but able to make gains during therapy.  Pt requires max VCs to maintain posture and task during session.   Difficult to assess due to: Level of arousal, Impaired communication   Blood pressure (!) 153/69, pulse 75, temperature 98 F (36.7 C), temperature source Oral, resp. rate 20, height 5\' 9"  (1.753 m), weight 86 kg (189 lb 9.5 oz), SpO2 94 %. Physical Exam  Nursing note and vitals  reviewed.  Constitutional: He appears well-developed and well-nourished. He appears lethargic. He is easily aroused.  HENT:  Head: Normocephalic and atraumatic.  Eyes: Conjunctivae and EOM are normal. Pupils are equal, round, and reactive to light.  Neck: Normal range of motion. Neck supple.  Cardiovascular: Normal rate and regular rhythm.  Exam reveals no friction rub.   No murmur heard. Respiratory: Effort normal and breath sounds normal. No stridor. No respiratory distress. He has no wheezes.  GI: Soft. Bowel sounds are normal. He exhibits no distension. There is no tenderness. There is no rebound.  Genitourinary:  Genitourinary Comments: Foley in place.   Musculoskeletal: He exhibits no edema or tenderness.  Neurological: He is easily aroused. He appears lethargic.  Left facial weakness with moderate to severe dysarthria. Oriented to self only. Distracted and kept looking at daughter for answers. Needed cues to stay awake and follow simple motor commands. Substantial left inattention with hemiparesis. Would not engage with left arm or leg today.   Skin: Skin is warm and dry.  Psychiatric: His affect is inappropriate. His speech is slurred. Cognition and memory are impaired. He expresses inappropriate judgment. He is noncommunicative. He is inattentive.    Results for orders placed or performed during the hospital encounter of 02/15/17 (from the past 48 hour(s))  Glucose, capillary     Status: Abnormal   Collection Time: 02/21/17  4:33 PM  Result Value Ref Range   Glucose-Capillary 115 (H) 65 - 99 mg/dL  Glucose, capillary     Status: Abnormal   Collection Time: 02/21/17  8:21 PM  Result Value Ref Range   Glucose-Capillary 121 (H) 65 - 99 mg/dL  Glucose, capillary     Status: Abnormal   Collection Time: 02/21/17 11:36 PM  Result Value Ref Range   Glucose-Capillary 118 (H) 65 - 99 mg/dL  Glucose, capillary     Status: Abnormal   Collection Time: 02/22/17  5:00 AM  Result Value Ref  Range   Glucose-Capillary 108 (H) 65 - 99 mg/dL  Glucose, capillary     Status: Abnormal   Collection Time: 02/22/17  8:24 AM  Result Value Ref Range   Glucose-Capillary 134 (H) 65 - 99 mg/dL  Glucose, capillary     Status: Abnormal   Collection Time: 02/22/17 11:50 AM  Result Value Ref Range   Glucose-Capillary 130 (H) 65 - 99 mg/dL  Glucose, capillary     Status: Abnormal   Collection Time: 02/22/17  4:13 PM  Result Value Ref Range   Glucose-Capillary 107 (H) 65 - 99 mg/dL   Comment 1 Notify RN    Comment 2 Document in Chart   Glucose, capillary     Status: Abnormal   Collection Time: 02/22/17  7:32 PM  Result Value Ref Range   Glucose-Capillary 106 (H) 65 - 99 mg/dL   Comment 1 Notify RN    Comment 2 Document in Chart   Glucose, capillary     Status: Abnormal   Collection Time: 02/23/17 12:02 AM  Result Value Ref Range   Glucose-Capillary 114 (H) 65 - 99 mg/dL   Comment 1 Notify RN    Comment 2 Document in Chart   Glucose, capillary     Status: Abnormal   Collection Time: 02/23/17  4:06 AM  Result Value Ref Range   Glucose-Capillary 116 (H) 65 - 99 mg/dL   Comment 1 Notify RN    Comment 2 Document in Chart   Glucose, capillary     Status: None   Collection  Time: 02/23/17  7:59 AM  Result Value Ref Range   Glucose-Capillary 98 65 - 99 mg/dL   Comment 1 Notify RN    Comment 2 Document in Chart   Glucose, capillary     Status: Abnormal   Collection Time: 02/23/17 11:37 AM  Result Value Ref Range   Glucose-Capillary 139 (H) 65 - 99 mg/dL    Dg Chest Port 1 View  Result Date: 02/22/2017 CLINICAL DATA:  Chest congestion . EXAM: PORTABLE CHEST 1 VIEW COMPARISON:  02/21/2017 . FINDINGS: Feeding tube noted with its tip below left hemidiaphragm. Cardiomegaly with mild bilateral interstitial prominence and small bilateral pleural effusions consistent with CHF. No pneumothorax . IMPRESSION: 1.  Feeding tube noted with tip below left hemidiaphragm. 2. Cardiomegaly with diffuse  bilateral from interstitial prominence and small bilateral pleural effusions consistent with mild CHF. 3. Persistent mild basilar subsegmental atelectasis . Electronically Signed   By: Marcello Moores  Register   On: 02/22/2017 07:13    Medical Problem List and Plan: 1.  Functional deficits secondary to right thalamic hemorrhage  -admit to inpatient rehab 2.  DVT Prophylaxis/Anticoagulation: Pharmaceutical: Lovenox 3. Pain Management: Tylenol prn.  4. Mood: LCSW to follow for evaluation and support when appropriate.  5. Neuropsych: This patient is not capable of making decisions on jisown behalf. 6. Skin/Wound Care: routine pressure relief measures. Maintain adequate nutritional and hydration status.  7. Fluids/Electrolytes/Nutrition: Monitor I/O. Check lytes in am. Encourage nectar thick liquids intake.  8. Dementia: Monitor sleep wake cycle. Maintain consistent schedule.  9. HTN:  Remains labile --will monitor BP qid. On Norvasc daily with Prinivil bid.  10. Pre- renal azotemia:  Worsening of renal status with removal of panda. Encourage po fluid intake. Discontinue IVF--may need PEG for adequate nutrition and hydration per reports.    11. Urinary retention: Foley placed on 3/14. Voiding trial as lethargy resolves.   12. Anemia: Recheck CBC in am.  13. Dysphagia: On dysphagia 1, nectar liquids with aspiration precautions.  14. Hyperglycemia: Hgb A1C- 5.5. Likely due to thickened juices for hydration.  15. Mild CHF due to Fluid overload: Monitor weights daily. Low salt diet.     Post Admission Physician Evaluation: 1. Functional deficits secondary  to right thalamic hemorrhage. 2. Patient is admitted to receive collaborative, interdisciplinary care between the physiatrist, rehab nursing staff, and therapy team. 3. Patient's level of medical complexity and substantial therapy needs in context of that medical necessity cannot be provided at a lesser intensity of care such as a SNF. 4. Patient has  experienced substantial functional loss from his/her baseline which was documented above under the "Functional History" and "Functional Status" headings.  Judging by the patient's diagnosis, physical exam, and functional history, the patient has potential for functional progress which will result in measurable gains while on inpatient rehab.  These gains will be of substantial and practical use upon discharge  in facilitating mobility and self-care at the household level. 5. Physiatrist will provide 24 hour management of medical needs as well as oversight of the therapy plan/treatment and provide guidance as appropriate regarding the interaction of the two. 6. The Preadmission Screening has been reviewed and patient status is unchanged unless otherwise stated above. 7. 24 hour rehab nursing will assist with bladder management, bowel management, safety, skin/wound care, disease management, medication administration, pain management and patient education  and help integrate therapy concepts, techniques,education, etc. 8. PT will assess and treat for/with: Lower extremity strength, range of motion, stamina, balance, functional mobility, safety,  adaptive techniques and equipment, NMR, arousal/engagement, family education.   Goals are: min to mod assist. 9. OT will assess and treat for/with: ADL's, functional mobility, safety, upper extremity strength, adaptive techniques and equipment, NMR, community reintegration, family education.   Goals are: min to mod assist. Therapy may not yet proceed with showering this patient. 10. SLP will assess and treat for/with: cognition, communication, swallowing, family education.  Goals are: min to mod assist. 11. Case Management and Social Worker will assess and treat for psychological issues and discharge planning. 12. Team conference will be held weekly to assess progress toward goals and to determine barriers to discharge. 13. Patient will receive at least 3 hours of  therapy per day at least 5 days per week. 14. ELOS: 25-28 days       15. Prognosis:  excellent     Meredith Staggers, MD, [facility] 02/23/2017  Bary Leriche, [place] 02/23/2017

## 2017-02-23 NOTE — Progress Notes (Signed)
Physical Therapy Treatment Patient Details Name: Anthony Fisher MRN: [id number] DOB: 1926/07/11 Today's Date: 02/23/2017    History of Present Illness Pt is a 83 y.o. male who presents from home to ED on 02/15/17 with L-sided weakness. CT shows intraparenchymal hematoma within R thalamus extending into R lateral ventricle. Pertinent PMH includes dementia.     PT Comments    Pt performed increased activity progressed to seated exercise.  Pt more alert and verbal during session.  Pt able to follow commands to use his yaunker independently.  Pt l;eft sitting in chair post treatment awaiting speech therapy.  Plan for CIR remains appropriate.     Follow Up Recommendations  CIR     Equipment Recommendations  None recommended by PT    Recommendations for Other Services Rehab consult     Precautions / Restrictions Precautions Precautions: Fall Restrictions Weight Bearing Restrictions: No    Mobility  Bed Mobility Overal bed mobility: Needs Assistance Bed Mobility: Sidelying to Sit;Rolling Rolling: Max assist;+2 for physical assistance (rolling to the R.  ) Sidelying to sit: Max assist (assist for LE advancement and elevation of trunk.  )       General bed mobility comments: Pt required cues for sequencing and assist to move LEs.  pt able to sit edge of bed unassisted intermittently but noticeable LOB posterior and Lateral to the R.    Transfers Overall transfer level: Needs assistance Equipment used:  (sara stedy) Transfers: Sit to/from [facility] Sit to Stand: Mod assist;+2 physical assistance (lateral lean to the L.  ) Stand pivot transfers: +2 physical assistance;Max assist (to facilitate trunk control within the stedy.  )       General transfer comment: able to support self seated eob with RUE on bed with max cues to prevent L lean.  LOB present to L and posterior but pt. was able to assist with correction with max verbal and tactile guidance.  Pt  actively assisted with pushing through bles to assist with coming into standing to place stedy cushions under buttocks.  Pt required facilitation for weight shifting to the R.    Ambulation/Gait Ambulation/Gait assistance:  (not performed.  )               Stairs            Wheelchair Mobility    Modified Rankin (Stroke Patients Only)       Balance Overall balance assessment: Needs assistance   Sitting balance-Leahy Scale: Poor       Standing balance-Leahy Scale: Poor                      Cognition Arousal/Alertness: Awake/alert Behavior During Therapy: WFL for tasks assessed/performed Overall Cognitive Status: Within Functional Limits for tasks assessed Area of Impairment: Problem solving Orientation Level: Disoriented to;Place;Time;Situation   Memory: Decreased short-term memory Following Commands: Follows one step commands inconsistently Safety/Judgement: Decreased awareness of safety;Decreased awareness of deficits   Problem Solving: Slow processing;Decreased initiation;Difficulty sequencing;Requires verbal cues;Requires tactile cues General Comments: Pt remains distracted but able to make gains during therapy.  Pt requires max VCs to maintain posture and task during session.      Exercises General Exercises - Lower Extremity Long Arc Quad: AROM;Both;10 reps;Seated Hip Flexion/Marching: AROM;Both;10 reps;Seated    General Comments        Pertinent Vitals/Pain Pain Assessment: No/denies pain Faces Pain Scale: No hurt    Home Living  Prior Function            PT Goals (current goals can now be found in the care plan section) Acute Rehab PT Goals Patient Stated Goal: Return home Potential to Achieve Goals: Good Progress towards PT goals: Progressing toward goals    Frequency    Min 4X/week      PT Plan Current plan remains appropriate    Co-evaluation             End of Session Equipment  Utilized During Treatment: Gait belt Activity Tolerance: Patient limited by lethargy Patient left: in chair;with call bell/phone within reach;with chair alarm set;with family/visitor present Nurse Communication: Mobility status;Need for lift equipment PT Visit Diagnosis: Muscle weakness (generalized) (M62.81);Hemiplegia and hemiparesis;Other abnormalities of gait and mobility (R26.89) Hemiplegia - Right/Left: Left Hemiplegia - dominant/non-dominant: Non-dominant Hemiplegia - caused by: Other Nontraumatic intracranial hemorrhage     Time: 5396-7289 PT Time Calculation (min) (ACUTE ONLY): 41 min  Charges:  $Therapeutic Exercise: 8-22 mins $Therapeutic Activity: 23-37 mins                    G Codes:       Cristela Blue 03/23/17, 11:32 AM Governor Rooks, PTA pager [phone]

## 2017-02-23 NOTE — Progress Notes (Addendum)
  Speech Language Pathology Treatment: Dysphagia;Cognitive-Linquistic  Patient Details Name: Anthony Fisher MRN: [id number] DOB: [birth year] Today's Date: 02/23/2017 Time: 0321-2248 SLP Time Calculation (min) (ACUTE ONLY): 13 min  Assessment / Plan / Recommendation Clinical Impression  Pt is more alert today and sitting upright in his chair to consume PO trials. Max cues were provided for recall of MBS and results. No overt s/s of aspiration were observed, even with impulsive intake. SLP provided Mod cues for slower pacing to allow for oral clearance of boluses prior to taking additional ones. No cues needed to initiate a swallow today though. Pt has minimal residuals from boluses, but he does have mild anterior loss of boluses and more significant amount of anterior loss with secretions. Mod-Max cues were provided for management throughout trials and oral suction was performed upon completion of intake. This appeared to be productive of mostly secretions. Would continue current diet along with thorough oral care.   HPI HPI: Pt is a 83 y.o. male who presents from home to ED on 02/15/17 with L-sided weakness. CT shows intraparenchymal hematoma within R thalamus extending into R lateral ventricle. Pertinent PMH includes dementia.       SLP Plan  Continue with current plan of care       Recommendations  Diet recommendations: Dysphagia 1 (puree);Nectar-thick liquid Liquids provided via: Straw Medication Administration: Crushed with puree Supervision: Patient able to self feed;Full supervision/cueing for compensatory strategies Compensations: Slow rate;Small sips/bites;Minimize environmental distractions;Follow solids with liquid Postural Changes and/or Swallow Maneuvers: Seated upright 90 degrees                Oral Care Recommendations: Oral care BID Follow up Recommendations: Inpatient Rehab SLP Visit Diagnosis: Dysphagia, oropharyngeal phase (R13.12);Cognitive communication deficit  (G50.037) Plan: Continue with current plan of care       GO                [name] 02/23/2017, 1:50 PM  [name], M.A. CCC-SLP [phone]

## 2017-02-23 NOTE — PMR Pre-admission (Addendum)
PMR Admission Coordinator Pre-Admission Assessment  Patient: Anthony Fisher is an 83 y.o., male MRN: [id number] DOB: 11/01/1926 Height: 5\' 9"  (175.3 cm) Weight: 85.7 kg (189 lb)              Insurance Information HMO: Yes    PPO:       PCP:       IPA:       80/20:       OTHER:   PRIMARY:  [employer] medicare      Policy#: [id number]      Subscriber:  Robb Matar CM Name: Sherlynn Stalls      Phone#: [phone]     Fax#:  [phone] Pre-Cert#: [id number]      Employer:  Retired Benefits:  Phone #: [phone]     Name:  Online Eff. Date: 12/07/16     Deduct:  $0      Out of Pocket Max: $4400      Life Max: unlimited CIR: $345 days 1-5      SNF: $0 days 1-20; $160 days 21-48; $0 days 49-100 Outpatient: medical necessity     Co-Pay: $40/visit Home Health: 100%      Co-Pay: none DME: 80%     Co-Pay: 20% Providers: in network  Medicaid Application Date:        Case Manager:   Disability Application Date:        Case Worker:    Emergency Contact Information Contact Information    Name Relation Home Work Mobile   [place] S Daughter [phone]     [place], [place]   [phone]     Current Medical History  Patient Admitting Diagnosis:  Left hemiparesis and hemisensory deficits as well as profound communication and swallowing dysfunction due to right thalamic ICH   History of Present Illness: An 83 y.o.malewith a history of dementia who was admitted on 02/15/17 with left-sided weakness, unsteady gait and fall. 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 CTA head/neck negative for stenosis, aneurysm or vascular malformation. MRI brain done revealing evolving right thalamic hemorrhage with old right caudate lacunar and advanced temporal lobe atropy--no CAA.  2 D echo with EF 60-65% with mild LVH and no motion abnormality. Labile blood pressures improving with adjustment of medications.  Patient with dysphagia  due to sensorimotor as well as cognitive deficits--has been on tube feed for nutritional support.  Foley placed on 3/14 due to urinary retention needing in and out caths. MBS done 3/19 with initiation of dysphagia 1, nectar liquids. Lethargy resolving with improvement in participation. Patient with resultant left sided weakness, decreased balance at EOB, cognitive deficits needing needs max verbal and tactile cues to follow simple commands. CIR recommended for follow up therapy.    Total: 15=NIH  Past Medical History  Past Medical History:  Diagnosis Date  . Adenomatous polyp of colon   . Dementia     Family History  family history is not on file.  Prior Rehab/Hospitalizations: No previous rehab admissions.  Has the patient had major surgery during 100 days prior to admission? No  Current Medications   Current Facility-Administered Medications:  .  0.9 %  sodium chloride infusion, , Intravenous, Continuous, Rosalin Hawking, MD, Last Rate: 30 mL/hr at 02/22/17 2115 .  acetaminophen (TYLENOL) tablet 650 mg, 650 mg, Oral, Q4H PRN **OR** acetaminophen (TYLENOL) solution 650 mg, 650 mg, Per Tube, Q4H PRN **OR** acetaminophen (TYLENOL) suppository 650 mg, 650 mg,  Rectal, Q4H PRN, Greta Doom, MD, 650 mg at 02/20/17 0556 .  amLODipine (NORVASC) tablet 10 mg, 10 mg, Oral, Daily, Rosalin Hawking, MD, 10 mg at 02/23/17 0919 .  chlorhexidine (PERIDEX) 0.12 % solution 15 mL, 15 mL, Mouth Rinse, BID, Veryl Speak, MD, 15 mL at 02/23/17 0920 .  heparin injection 5,000 Units, 5,000 Units, Subcutaneous, Q8H, Rosalin Hawking, MD, 5,000 Units at 02/23/17 1407 .  labetalol (NORMODYNE,TRANDATE) injection 10-20 mg, 10-20 mg, Intravenous, Q2H PRN, Rosalin Hawking, MD .  lisinopril (PRINIVIL,ZESTRIL) tablet 20 mg, 20 mg, Oral, BID, Rosalin Hawking, MD, 20 mg at 02/23/17 0919 .  MEDLINE mouth rinse, 15 mL, Mouth Rinse, q12n4p, Veryl Speak, MD, 15 mL at 02/22/17 1700 .  Melatonin TABS 3 mg, 3 mg, Oral, QHS, Rachel L  Rumbarger, RPH, 3 mg at 02/22/17 2114 .  multivitamin liquid 15 mL, 15 mL, Per Tube, Daily, Rosalin Hawking, MD, 15 mL at 02/23/17 0920 .  pantoprazole sodium (PROTONIX) 40 mg/20 mL oral suspension 40 mg, 40 mg, Per Tube, Daily, Garvin Fila, MD, 40 mg at 02/23/17 0919 .  polyethylene glycol (MIRALAX / GLYCOLAX) packet 17 g, 17 g, Oral, Daily, Rosalin Hawking, MD, 17 g at 02/23/17 0919 .  RESOURCE THICKENUP CLEAR, , Oral, PRN, Garvin Fila, MD  Patients Current Diet: DIET - DYS 1 Room service appropriate? Yes; Fluid consistency: Nectar Thick  Precautions / Restrictions Precautions Precautions: Fall Restrictions Weight Bearing Restrictions: No   Has the patient had 2 or more falls or a fall with injury in the past year?No  Prior Activity Level Community (5-7x/wk): Went out daily, was not driving.  Home Assistive Devices / Equipment Home Assistive Devices/Equipment: Eyeglasses Home Equipment: None  Prior Device Use: Indicate devices/aids used by the patient prior to current illness, exacerbation or injury? None  Prior Functional Level Prior Function Level of Independence: Independent Comments: Pt was very active and independent PTA   Self Care: Did the patient need help bathing, dressing, using the toilet or eating?  Independent  Indoor Mobility: Did the patient need assistance with walking from room to room (with or without device)? Independent  Stairs: Did the patient need assistance with internal or external stairs (with or without device)? Independent  Functional Cognition: Did the patient need help planning regular tasks such as shopping or remembering to take medications? Independent  Current Functional Level Cognition  Arousal/Alertness: Awake/alert Overall Cognitive Status: Within Functional Limits for tasks assessed Difficult to assess due to: Level of arousal, Impaired communication Current Attention Level: Focused, Sustained Orientation Level: Oriented to  person Following Commands: Follows one step commands inconsistently Safety/Judgement: Decreased awareness of safety, Decreased awareness of deficits General Comments: Pt remains distracted but able to make gains during therapy.  Pt requires max VCs to maintain posture and task during session.   Attention: Sustained Sustained Attention: Impaired Sustained Attention Impairment: Verbal basic Memory: Impaired Memory Impairment: Decreased recall of new information Awareness: Impaired Awareness Impairment: Intellectual impairment, Emergent impairment Problem Solving: Impaired Problem Solving Impairment: Functional basic Safety/Judgment: Impaired    Extremity Assessment (includes Sensation/Coordination)  Upper Extremity Assessment: LUE deficits/detail LUE Deficits / Details: Pt with Lt inattention/neglect.  He will attempt to initiate movement with Lt UE when prompted.  He demonstrates activel flex/ext of fingers, ~40% elbow flexion actively, and small range shoulder flexion  LUE Sensation: decreased proprioception LUE Coordination: decreased gross motor, decreased fine motor  Lower Extremity Assessment: Defer to PT evaluation RLE Deficits / Details: RLE grossly 3/5 throughout; lacks  full knee ext LLE Deficits / Details: No active movement in LLE; lacks full knee ext    ADLs  Overall ADL's : Needs assistance/impaired Eating/Feeding: NPO Grooming: Brushing hair, Moderate assistance, Sitting Grooming Details (indicate cue type and reason): sitting EOB.  Loses balance posteriorly  Upper Body Bathing: Total assistance, Sitting Upper Body Bathing Details (indicate cue type and reason): Pt attempting to assist with donning gown, however, pulls UE out of sleeve then gets tangled up in the gown.  Loses balance posteriorly with no attempts at righting  Lower Body Bathing: Maximal assistance, Sit to/from stand Upper Body Dressing : Maximal assistance, Sitting Lower Body Dressing: Maximal assistance,  Cueing for compensatory techniques, Cueing for sequencing, Sitting/lateral leans Lower Body Dressing Details (indicate cue type and reason): able to doff left sock with min a but required max a for right sock.  continued encouragement and cues for task completion Toilet Transfer: Maximal assistance, Stand-pivot, [facility] Toilet Transfer Details (indicate cue type and reason): assist for forward translation of trunk, and faciliation to lift hips.  requires assist to advance Lt foot, moderate pushing to Lt  Toileting- Clothing Manipulation and Hygiene: Total assistance, Sit to/from stand Functional mobility during ADLs: Maximal assistance    Mobility  Overal bed mobility: Needs Assistance Bed Mobility: Sidelying to Sit, Rolling Rolling: Max assist, +2 for physical assistance (rolling to the R.  ) Sidelying to sit: Max assist (assist for LE advancement and elevation of trunk.  ) Supine to sit: Max assist, HOB elevated, +2 for physical assistance General bed mobility comments: Pt required cues for sequencing and assist to move LEs.  pt able to sit edge of bed unassisted intermittently but noticeable LOB posterior and Lateral to the R.      Transfers  Overall transfer level: Needs assistance Equipment used:  (sara stedy) Transfer via Lift Equipment: Stedy Transfers: Sit to/from Stand, Risk manager Sit to Stand: Mod assist, +2 physical assistance (lateral lean to the L.  ) Stand pivot transfers: +2 physical assistance, Max assist (to facilitate trunk control within the stedy.  ) General transfer comment: able to support self seated eob with RUE on bed with max cues to prevent L lean.  LOB present to L and posterior but pt. was able to assist with correction with max verbal and tactile guidance.  Pt actively assisted with pushing through bles to assist with coming into standing to place stedy cushions under buttocks.  Pt required facilitation for weight shifting to the R.      Ambulation / Gait  / Stairs / Wheelchair Mobility  Ambulation/Gait Ambulation/Gait assistance:  (not performed.  ) Ambulation Distance (Feet): 5 Feet Assistive device: None Gait Pattern/deviations: Decreased weight shift to right, Leaning posteriorly, Narrow base of support, Step-to pattern, Decreased step length - right, Decreased step length - left General Gait Details: Amb with max +2 "three musketeer" technique; pt able to initiate step with RLE after facilitation of weight shift.     Posture / Balance Dynamic Sitting Balance Sitting balance - Comments: requires min A statically.  Max - total A when he attempts to engage in activity  Balance Overall balance assessment: Needs assistance Sitting-balance support: Single extremity supported, Feet supported Sitting balance-Leahy Scale: Poor Sitting balance - Comments: requires min A statically.  Max - total A when he attempts to engage in activity  Postural control: Posterior lean, Left lateral lean Standing balance support: Single extremity supported Standing balance-Leahy Scale: Poor Standing balance comment: max A.  Pushes Lt  Special needs/care consideration BiPAP/CPAP No CPM No Continuous Drip IV 0.9% NS at 30 mL/hr Dialysis No       Life Vest No Oxygen No Special Bed No Trach Size No Wound Vac (area) No    Skin Dry skin                            Bowel mgmt: Last BM 02/22/17 Bladder mgmt: Foley catheter in place for urinary retention Diabetic mgmt No    Previous Home Environment Living Arrangements: Children  Lives With: Family Available Help at Discharge: Family, Available 24 hours/day Type of Home: House Home Layout: One level Home Access: Stairs to enter Entrance Stairs-Rails: None Entrance Stairs-Number of Steps: 1 Home Care Services: No  Discharge Living Setting Plans for Discharge Living Setting: Patient's home, House, Lives with (comment) (Lives with son, 2 grand children.) Type of Home at Discharge: House Discharge Home  Layout: One level Discharge Home Access: Stairs to enter Entrance Stairs-Number of Steps: 1 step entry Does the patient have any problems obtaining your medications?: No  Social/Family/Support Systems Patient Roles: Parent, Other (Comment) (Has a daughter, son, grandchilden.) Contact Information: Rony Ratz - daughter Anticipated Caregiver: Daughter and family Anticipated Caregiver's Contact Information: [name] - daughter - [phone] Ability/Limitations of Caregiver: Son works days, daughter has flexible schedule working as a Clinical biochemist.  Grandchildren ages 21, 1 and 65 can assist.  Boyfriend of 65 yo has CNA experience. Caregiver Availability: 24/7 (Dtr is committed to providing care at home after rehab.) Discharge Plan Discussed with Primary Caregiver: Yes Is Caregiver In Agreement with Plan?: Yes Does Caregiver/Family have Issues with Lodging/Transportation while Pt is in Rehab?: No  Goals/Additional Needs Patient/Family Goal for Rehab: PT/OT/SLP min to mod assist goals Expected length of stay: 14-17 days Cultural Considerations: Darrick Meigs, attends church with granddaughter at times. Dietary Needs: Dys 1, nectar thick liquids Equipment Needs: TBD Pt/Family Agrees to Admission and willing to participate: Yes (I spoke at length with daughter.) Program Orientation Provided & Reviewed with Pt/Caregiver Including Roles  & Responsibilities: Yes  Decrease burden of Care through IP rehab admission: N/A  Possible need for SNF placement upon discharge: Not planned, family prefers to take patient home and provide care at home after rehab.  Patient Condition: This patient's medical and functional status has changed since the consult dated: 02/16/17 in which the Rehabilitation Physician determined and documented that the patient's condition is appropriate for intensive rehabilitative care in an inpatient rehabilitation facility. See "History of Present Illness" (above) for  medical update. Functional changes are: Currently requiring max assist for stand pivot transfers. Patient's medical and functional status update has been discussed with the Rehabilitation physician and patient remains appropriate for inpatient rehabilitation. Will admit to inpatient rehab today.  Preadmission Screen Completed By:  Retta Diones, 02/23/2017 3:20 PM ______________________________________________________________________   Discussed status with Dr.  Naaman Plummer on 02/23/17 at 1519 and received telephone approval for admission today.  Admission Coordinator:  Retta Diones, time 1519/Date 02/23/17

## 2017-02-23 NOTE — Care Management Note (Signed)
Case Management Note  Patient Details  Name: Anthony Fisher MRN: [id number] Date of Birth: 1926-06-25  Subjective/Objective:                    Action/Plan: Pt discharging to CIR today. No further needs per CM.  Expected Discharge Date:  02/23/17               Expected Discharge Plan:  [place]  In-House Referral:     Discharge planning Services  CM Consult  Post Acute Care Choice:    Choice offered to:     DME Arranged:    DME Agency:     HH Arranged:    HH Agency:     Status of Service:  Completed, signed off  If discussed at [employer], dates discussed:    Additional Comments:  Pollie Friar, RN 02/23/2017, 3:59 PM

## 2017-02-23 NOTE — Progress Notes (Signed)
Nutrition Follow-up   INTERVENTION:  Provide Mighty Shake II TID with meals, each supplement provides 480-500 kcals and 20-23 grams of protein  Monitor PO intake for adequacy  NUTRITION DIAGNOSIS:   Inadequate oral intake related to dysphagia as evidenced by NPO status.  discontinued  GOAL:   Patient will meet greater than or equal to 90% of their needs  Unmet  MONITOR:   PO intake, Supplement acceptance, Labs, I & O's  REASON FOR ASSESSMENT:   Consult Enteral/tube feeding initiation and management  ASSESSMENT:   83 y.o. male with history of mild dementia presenting with L sided weakness. CT showed a R thalamic hemorrhage  NGT removed. Per pt's daughter at bedside, pt didn't eat much at breakfast (about 25%) due to lethargy, but did drink all of milk. Per daughter pt woke up later this morning and stated that he was hungry. RD encouraged intake of Mighty Shakes with meals.   Labs reviewed.   Diet Order:  DIET - DYS 1 Room service appropriate? Yes; Fluid consistency: Nectar Thick  Skin:  Reviewed, no issues  Last BM:  3/19  Height:   Ht Readings from Last 1 Encounters:  02/15/17 5\' 9"  (1.753 m)    Weight:   Wt Readings from Last 1 Encounters:  02/19/17 189 lb 9.5 oz (86 kg)    Ideal Body Weight:  72.7 kg  BMI:  Body mass index is 28 kg/m.  Estimated Nutritional Needs:   Kcal:  1600-1800  Protein:  80-95 grams  Fluid:  > 1.6 L/day  EDUCATION NEEDS:   No education needs identified at this time  Scarlette Ar RD, LDN, CSP Inpatient Clinical Dietitian Pager: [phone] After Hours Pager: [phone]

## 2017-02-23 NOTE — Discharge Summary (Signed)
Stroke Discharge Summary  Patient ID: [email or web address] payer   MRN: [id number]      DOB: Nov 13, 1926  Date of Admission: 02/15/2017 Date of Discharge: 02/23/2017  Attending Physician:  Garvin Fila, MD, Stroke MD Consultant(s):   Alger Simons, MD (Physical Medicine & Rehabtilitation) Patient's PCP:  No primary care provider on file.  Discharge Diagnoses:  Principal Problem:   ICH (intracerebral hemorrhage) ([place]) - R thalamic hemorrhage Active Problems:   IVH (intraventricular hemorrhage) (HCC)   Hypertensive emergency   Dysphagia due to recent stroke   Urinary retention   Dementia   Congestive heart failure (HCC)   Hypokalemia  Past Medical History:  Diagnosis Date  . Adenomatous polyp of colon   . Dementia    Past Surgical History:  Procedure Laterality Date  . CATARACT EXTRACTION W/ INTRAOCULAR LENS  IMPLANT, BILATERAL      Medications to be continued on Rehab . amLODipine  10 mg Oral Daily  . chlorhexidine  15 mL Mouth Rinse BID  . heparin subcutaneous  5,000 Units Subcutaneous Q8H  . lisinopril  20 mg Oral BID  . mouth rinse  15 mL Mouth Rinse q12n4p  . Melatonin  3 mg Oral QHS  . multivitamin  15 mL Per Tube Daily  . pantoprazole sodium  40 mg Per Tube Daily  . polyethylene glycol  17 g Oral Daily    LABORATORY STUDIES CBC    Component Value Date/Time   WBC 7.0 02/21/2017 1415   RBC 3.81 (L) 02/21/2017 1415   HGB 12.3 (L) 02/21/2017 1415   HCT 37.0 (L) 02/21/2017 1415   PLT 218 02/21/2017 1415   MCV 97.1 02/21/2017 1415   MCH 32.3 02/21/2017 1415   MCHC 33.2 02/21/2017 1415   RDW 13.4 02/21/2017 1415   LYMPHSABS 2.3 02/15/2017 1924   MONOABS 0.8 02/15/2017 1924   EOSABS 0.2 02/15/2017 1924   BASOSABS 0.1 02/15/2017 1924   CMP    Component Value Date/Time   NA 138 02/21/2017 1415   K 4.0 02/21/2017 1415   CL 108 02/21/2017 1415   CO2 21 (L) 02/21/2017 1415   GLUCOSE 132 (H) 02/21/2017 1415   BUN 26 (H) 02/21/2017 1415   CREATININE 0.80  02/21/2017 1415   CALCIUM 8.8 (L) 02/21/2017 1415   PROT 6.1 (L) 02/21/2017 1415   ALBUMIN 3.3 (L) 02/21/2017 1415   AST 43 (H) 02/21/2017 1415   ALT 26 02/21/2017 1415   ALKPHOS 45 02/21/2017 1415   BILITOT 0.9 02/21/2017 1415   GFRNONAA >60 02/21/2017 1415   GFRAA >60 02/21/2017 1415   COAGS Lab Results  Component Value Date   INR 1.07 02/15/2017   Lipid Panel    Component Value Date/Time   CHOL 131 02/16/2017 0804   TRIG 69 02/16/2017 0804   HDL 47 02/16/2017 0804   CHOLHDL 2.8 02/16/2017 0804   VLDL 14 02/16/2017 0804   LDLCALC 70 02/16/2017 0804   HgbA1C  Lab Results  Component Value Date   HGBA1C 5.5 02/16/2017   Urinalysis    Component Value Date/Time   COLORURINE YELLOW 02/19/2017 1040   APPEARANCEUR CLEAR 02/19/2017 1040   LABSPEC 1.019 02/19/2017 1040   PHURINE 7.0 02/19/2017 [place] 02/19/2017 1040   HGBUR NEGATIVE 02/19/2017 1040   BILIRUBINUR NEGATIVE 02/19/2017 1040   KETONESUR NEGATIVE 02/19/2017 1040   PROTEINUR NEGATIVE 02/19/2017 1040   UROBILINOGEN 0.2 05/09/2009 1004   NITRITE NEGATIVE 02/19/2017 1040   LEUKOCYTESUR TRACE (  A) 02/19/2017 1040   Urine Drug Screen     Component Value Date/Time   LABOPIA NONE DETECTED 02/19/2017 1040   COCAINSCRNUR NONE DETECTED 02/19/2017 1040   LABBENZ NONE DETECTED 02/19/2017 1040   AMPHETMU NONE DETECTED 02/19/2017 1040   THCU NONE DETECTED 02/19/2017 1040   LABBARB NONE DETECTED 02/19/2017 1040    Alcohol Level    Component Value Date/Time   [facility] <5 02/15/2017 1924    SIGNIFICANT DIAGNOSTIC STUDIES  Dg Chest Port 1 View 02/16/2017 Cardiomegaly. Bibasilar atelectasis.   02/21/2017 1. Mildly increased mild bibasilar atelectasis. 2. Possible small bilateral pleural effusions. 3. Aortic atherosclerosis.  02/22/2017 1.  Feeding tube noted with tip below left hemidiaphragm. 2. Cardiomegaly with diffuse bilateral from interstitial prominence and small bilateral pleural effusions  consistent with mild CHF. 3. Persistent mild basilar subsegmental atelectasis .  Ct Head Code Stroke W/o Cm 02/15/2017 1. Intraparenchymal hematoma within the right thalamus extending into the atrium of the right lateral ventricle. The location is most consistent with hypertensive hemorrhage.  2. No midline shift or other significant mass effect.  3. Intraventricular blood products may lead to hydrocephalus. Short interval surveillance is warranted.   CT HEAD  02/16/2017 2.8 x 2 x 2.8 cm right thalamic hemorrhage with overall similar dimensions to the recent exam. Slight increase in degree of surrounding vasogenic edema. Mild mass effect with compression right lateral aspect of the third ventricle. Breakthrough of hemorrhage into right lateral ventricle as previously noted. Tiny amount of intraventricular blood now also noted within the dependent aspect of the left lateral ventricle.   CTA NECK  02/16/2017 Carotid arteries are tortuous with medial displacement of internal carotid arteries bilaterally.  No evidence of hemodynamically significant stenosis of either carotid artery.  Mild to slightly moderate narrowing proximal right subclavian artery. Right vertebral artery is dominant.   CTA HEAD  02/16/2017 Calcified plaque with mild narrowing cavernous segment internal carotid artery bilaterally. No significant stenosis carotid terminus, A1 segment or M1 segment of the anterior cerebral artery or middle cerebral artery on either side. No aneurysm or vascular malformation noted. Right vertebral artery is dominant with minimal plaque and narrowing. Left vertebral artery predominantly ends in a posterior inferior cerebellar artery distribution with moderate narrowing at the level of the takeoff of the left posterior inferior cerebellar artery.   MR BRAIN 02/18/2017 Evolving RIGHT thalamic hemorrhage with intraventricular extension. Old RIGHT caudate lacunar. Mild chronic small vessel ischemic  disease. Advanced temporal lobe atrophy associated with neurodegenerative disorder.  2-D echocardiogram  - Left ventricle: The cavity size was normal. Wall thickness wasincreased in a pattern of mild LVH. Systolic function was normal.The estimated ejection fraction was in the range of 60% to 65%.Wall motion was normal; there were no regional wall motionabnormalities. Doppler parameters are consistent with abnormalleft ventricular relaxation (grade 1 diastolic dysfunction). - Aortic valve: There was mild regurgitation. - Left atrium: The atrium was mildly dilated. Impressions: - Normal LV systolic function; grade 1 diastolic dysfunction;sclerotic aortic valve with mild AI; mld LAE.     HISTORY OF PRESENT ILLNESS Anthony Fisher a 83 y.o.malewith a history of mild dementia who presents with left-sided weakness that started earlier today. He apparently was up and around around 8 or 9 AM and was normal at that time (LKW 02/15/2017 at 0800). Then "a couple of hours later" he was noticed to be stumbling, and decided that he wanted to lay down. He was then not seen until 7 PM, when they heard a fall  and found him with left-sided weakness. CT showed a R thalamic hemorrhage. ICH Score: 2.  He was admitted to the neuro ICU for further evaluation and treatment.    HOSPITAL COURSE Mr. SHIVAAY STORMONT is a 83 y.o. male with history of mild dementia presenting with L sided weakness. CT showed a R thalamic hemorrhage  ICH:  right thalamic hemorrhage with IVH in setting of hypertensive emergency.   Resultant  left hemiparesis  Code Stroke CT R thalamic hemorrhage with extension into R lateral ventricle. No shift or mass effect.  CTA head and neck unremarkable  MRI - 02/18/2017 - no CAA.  2D Echo  EF 60-65%   LDL 70  HgbA1c 5.5  No antithrombotic prior to admission  Ongoing aggressive stroke risk factor management  Therapy recommendations:  CIR   Disposition:  CIR    Hypertensive Emergency  BP 184/94 on arrival in setting of neurologic deficits  On lisinopril 20 mg BID and amlodipine 10 mg daily - still high.   SBP goal < 180  at time of discharge   Continue to monitor  Dysphagia    received tube feedings during hospitalization   ST following - increased diet after modified barium swallow   DIET - DYS 1 Room service appropriate? Yes; Fluid consistency: Nectar Thick   Urinary retention  Frequent I/O  On foley catheter  Monitor. Attempt to d/c when able  Dementia  Lives with son at home  Memory difficulty  Able to do ADLs  Other Stroke Risk Factors  Advanced age  UDS negative   Mild CHF on CXR this am. Persistent mild basilar subsegmental atx - given lasix 20 mg per tube 3/19 - recommend repeat CXR 3/21  Other Active Problems  Hypokalemia, resolved after supplement    DISCHARGE EXAM Blood pressure (!) 153/69, pulse 75, temperature 98 F (36.7 C), temperature source Oral, resp. rate 20, height 5\' 9"  (1.753 m), weight 86 kg (189 lb 9.5 oz), SpO2 94 %. General - Well nourished, well developed, not in acute distress.  Ophthalmologic - Fundi not visualized due to noncooperative.  Cardiovascular - Regular rate and rhythm.  Neuro - awake, alert, able to open eyes on voice. Hard of hearing and severe dysarthria. Eyes attending to both sides, PERRL. Left facial droop, tongue in midline, sever dysarthria. RUE 4+/5 at least, LUE 3/5, RLE 4/5 and LLE 4-/5. B/l babinski positive. DTR 1+. Sensation, coordination not cooperative on exam and gait not tested.   Discharge Diet  DIET - DYS 1 Room service appropriate? Yes; Fluid consistency: Nectar Thick liquids  DISCHARGE PLAN  Disposition:  Transfer to [place] [facility] for ongoing PT, OT and ST  No antithrombotic therapy given hemorrhage  Recommend ongoing risk factor control by Primary Care Physician at time of discharge from inpatient  rehabilitation.  Follow-up No primary care provider on file. in 2 weeks following discharge from rehab.  Follow-up with Dr. Antony Contras, Stroke Clinic in 6 weeks, office to schedule an appointment.   45 minutes were spent preparing discharge.  [place] for Pager information 02/23/2017 5:51 PM   I have personally examined this patient, reviewed notes, independently viewed imaging studies, participated in medical decision making and plan of care.ROS completed by me personally and pertinent positives fully documented  I have made any additions or clarifications directly to the above note. Agree with note above.    Antony Contras, MD Medical Director [facility] Pager:  [phone] 02/23/2017 10:13 PM

## 2017-02-23 NOTE — Progress Notes (Signed)
Report given to [facility] from 4W. Family aware of disposition. Per nurse from [place] will call when room is ready. [id number] currently not ready at this time.   Ave Filter, RN

## 2017-02-23 NOTE — Progress Notes (Signed)
Meredith Staggers, MD Physician Addendum Physical Medicine and Rehabilitation  PMR Pre-admission Date of Service: 02/23/2017 3:08 PM  Related encounter: ED to Hosp-Admission (Current) from 02/15/2017 in [place]       [] Hide copied text PMR Admission Coordinator Pre-Admission Assessment  Patient: Anthony Fisher is an 83 y.o., male MRN: [id number] DOB: [birth year] Height: 5\' 9"  (175.3 cm) Weight: 85.7 kg (189 lb)                                                                                                                                                  Insurance Information HMO: Yes    PPO:       PCP:       IPA:       80/20:       OTHER:   PRIMARY:  [employer] medicare      Policy#: [id number]      Subscriber:  Robb Matar CM Name: Sherlynn Stalls      Phone#: [phone]     Fax#:  [phone] Pre-Cert#: [id number]      Employer:  Retired Benefits:  Phone #: [phone]     Name:  Hanley Seamen. Date: 12/07/16     Deduct:  $0      Out of Pocket Max: $4400      Life Max: unlimited CIR: $345 days 1-5      SNF: $0 days 1-20; $160 days 21-48; $0 days 49-100 Outpatient: medical necessity     Co-Pay: $40/visit Home Health: 100%      Co-Pay: none DME: 80%     Co-Pay: 20% Providers: in network  Medicaid Application Date:        Case Manager:   Disability Application Date:        Case Worker:    Emergency Contact Information        Contact Information    Name Relation Home Work Mobile   [place] S Daughter [phone]     Eufemio, Strahm   [phone]     Current Medical History  Patient Admitting Diagnosis: Left hemiparesis and hemisensory deficits as well as profound communication and swallowing dysfunction due to right thalamic ICH   History of Present Illness: An 83 y.o.malewith a history of dementia who was admitted on 02/15/17 with left-sided weakness, unsteady gait and fall. 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 CTA head/neck negative for stenosis, aneurysm or vascular malformation. MRI brain done revealing evolving right thalamic hemorrhage with old right caudate lacunar and advanced temporal lobe atropy--no CAA. 2 D echo with EF 60-65% with mild LVH and no motion abnormality. Labile blood pressures improving with adjustment of medications. Patient with dysphagia due to sensorimotor as well as cognitive deficits--has been on tube feed for nutritional support.  Foley placed on 3/14 due to urinary retention needing in and out caths. MBS done 3/19 with initiation of dysphagia 1, nectar liquids. Lethargy resolving with improvement in participation. Patient with resultant left sided weakness, decreased balance at EOB, cognitive deficits needing needs max verbal and tactile cues to follow simple commands. CIR recommended for follow up therapy.    Total: 15=NIH  Past Medical History      Past Medical History:  Diagnosis Date  . Adenomatous polyp of colon   . Dementia     Family History  family history is not on file.  Prior Rehab/Hospitalizations: No previous rehab admissions.  Has the patient had major surgery during 100 days prior to admission? No  Current Medications   Current Facility-Administered Medications:  .  0.9 %  sodium chloride infusion, , Intravenous, Continuous, Rosalin Hawking, MD, Last Rate: 30 mL/hr at 02/22/17 2115 .  acetaminophen (TYLENOL) tablet 650 mg, 650 mg, Oral, Q4H PRN **OR** acetaminophen (TYLENOL) solution 650 mg, 650 mg, Per Tube, Q4H PRN **OR** acetaminophen (TYLENOL) suppository 650 mg, 650 mg, Rectal, Q4H PRN, Greta Doom, MD, 650 mg at 02/20/17 0556 .  amLODipine (NORVASC) tablet 10 mg, 10 mg, Oral, Daily, Rosalin Hawking, MD, 10 mg at 02/23/17 0919 .  chlorhexidine (PERIDEX) 0.12 % solution 15 mL, 15 mL, Mouth Rinse, BID, Veryl Speak, MD, 15 mL at 02/23/17 0920 .  heparin injection 5,000  Units, 5,000 Units, Subcutaneous, Q8H, Rosalin Hawking, MD, 5,000 Units at 02/23/17 1407 .  labetalol (NORMODYNE,TRANDATE) injection 10-20 mg, 10-20 mg, Intravenous, Q2H PRN, Rosalin Hawking, MD .  lisinopril (PRINIVIL,ZESTRIL) tablet 20 mg, 20 mg, Oral, BID, Rosalin Hawking, MD, 20 mg at 02/23/17 0919 .  MEDLINE mouth rinse, 15 mL, Mouth Rinse, q12n4p, Veryl Speak, MD, 15 mL at 02/22/17 1700 .  Melatonin TABS 3 mg, 3 mg, Oral, QHS, Rachel L Rumbarger, RPH, 3 mg at 02/22/17 2114 .  multivitamin liquid 15 mL, 15 mL, Per Tube, Daily, Rosalin Hawking, MD, 15 mL at 02/23/17 0920 .  pantoprazole sodium (PROTONIX) 40 mg/20 mL oral suspension 40 mg, 40 mg, Per Tube, Daily, Garvin Fila, MD, 40 mg at 02/23/17 0919 .  polyethylene glycol (MIRALAX / GLYCOLAX) packet 17 g, 17 g, Oral, Daily, Rosalin Hawking, MD, 17 g at 02/23/17 0919 .  RESOURCE THICKENUP CLEAR, , Oral, PRN, Garvin Fila, MD  Patients Current Diet: DIET - DYS 1 Room service appropriate? Yes; Fluid consistency: Nectar Thick  Precautions / Restrictions Precautions Precautions: Fall Restrictions Weight Bearing Restrictions: No   Has the patient had 2 or more falls or a fall with injury in the past year?No  Prior Activity Level Community (5-7x/wk): Went out daily, was not driving.  Home Assistive Devices / Equipment Home Assistive Devices/Equipment: Eyeglasses Home Equipment: None  Prior Device Use: Indicate devices/aids used by the patient prior to current illness, exacerbation or injury? None  Prior Functional Level Prior Function Level of Independence: Independent Comments: Pt was very active and independent PTA   Self Care: Did the patient need help bathing, dressing, using the toilet or eating?  Independent  Indoor Mobility: Did the patient need assistance with walking from room to room (with or without device)? Independent  Stairs: Did the patient need assistance with internal or external stairs (with or without device)?  Independent  Functional Cognition: Did the patient need help planning regular tasks such as shopping or remembering to take medications? Independent  Current Functional Level Cognition  Arousal/Alertness: Awake/alert Overall Cognitive Status:  Within Functional Limits for tasks assessed Difficult to assess due to: Level of arousal, Impaired communication Current Attention Level: Focused, Sustained Orientation Level: Oriented to person Following Commands: Follows one step commands inconsistently Safety/Judgement: Decreased awareness of safety, Decreased awareness of deficits General Comments: Pt remains distracted but able to make gains during therapy.  Pt requires max VCs to maintain posture and task during session.   Attention: Sustained Sustained Attention: Impaired Sustained Attention Impairment: Verbal basic Memory: Impaired Memory Impairment: Decreased recall of new information Awareness: Impaired Awareness Impairment: Intellectual impairment, Emergent impairment Problem Solving: Impaired Problem Solving Impairment: Functional basic Safety/Judgment: Impaired    Extremity Assessment (includes Sensation/Coordination)  Upper Extremity Assessment: LUE deficits/detail LUE Deficits / Details: Pt with Lt inattention/neglect.  He will attempt to initiate movement with Lt UE when prompted.  He demonstrates activel flex/ext of fingers, ~40% elbow flexion actively, and small range shoulder flexion  LUE Sensation: decreased proprioception LUE Coordination: decreased gross motor, decreased fine motor  Lower Extremity Assessment: Defer to PT evaluation RLE Deficits / Details: RLE grossly 3/5 throughout; lacks full knee ext LLE Deficits / Details: No active movement in LLE; lacks full knee ext    ADLs  Overall ADL's : Needs assistance/impaired Eating/Feeding: NPO Grooming: Brushing hair, Moderate assistance, Sitting Grooming Details (indicate cue type and reason): sitting EOB.   Loses balance posteriorly  Upper Body Bathing: Total assistance, Sitting Upper Body Bathing Details (indicate cue type and reason): Pt attempting to assist with donning gown, however, pulls UE out of sleeve then gets tangled up in the gown.  Loses balance posteriorly with no attempts at righting  Lower Body Bathing: Maximal assistance, Sit to/from stand Upper Body Dressing : Maximal assistance, Sitting Lower Body Dressing: Maximal assistance, Cueing for compensatory techniques, Cueing for sequencing, Sitting/lateral leans Lower Body Dressing Details (indicate cue type and reason): able to doff left sock with min a but required max a for right sock.  continued encouragement and cues for task completion Toilet Transfer: Maximal assistance, Stand-pivot, [facility] Toilet Transfer Details (indicate cue type and reason): assist for forward translation of trunk, and faciliation to lift hips.  requires assist to advance Lt foot, moderate pushing to Lt  Toileting- Clothing Manipulation and Hygiene: Total assistance, Sit to/from stand Functional mobility during ADLs: Maximal assistance    Mobility  Overal bed mobility: Needs Assistance Bed Mobility: Sidelying to Sit, Rolling Rolling: Max assist, +2 for physical assistance (rolling to the R.  ) Sidelying to sit: Max assist (assist for LE advancement and elevation of trunk.  ) Supine to sit: Max assist, HOB elevated, +2 for physical assistance General bed mobility comments: Pt required cues for sequencing and assist to move LEs.  pt able to sit edge of bed unassisted intermittently but noticeable LOB posterior and Lateral to the R.      Transfers  Overall transfer level: Needs assistance Equipment used:  (sara stedy) Transfer via Lift Equipment: Stedy Transfers: Sit to/from Stand, Risk manager Sit to Stand: Mod assist, +2 physical assistance (lateral lean to the L.  ) Stand pivot transfers: +2 physical assistance, Max assist (to facilitate trunk  control within the stedy.  ) General transfer comment: able to support self seated eob with RUE on bed with max cues to prevent L lean.  LOB present to L and posterior but pt. was able to assist with correction with max verbal and tactile guidance.  Pt actively assisted with pushing through bles to assist with coming into standing to place stedy  cushions under buttocks.  Pt required facilitation for weight shifting to the R.      Ambulation / Gait / Stairs / Wheelchair Mobility  Ambulation/Gait Ambulation/Gait assistance:  (not performed.  ) Ambulation Distance (Feet): 5 Feet Assistive device: None Gait Pattern/deviations: Decreased weight shift to right, Leaning posteriorly, Narrow base of support, Step-to pattern, Decreased step length - right, Decreased step length - left General Gait Details: Amb with max +2 "three musketeer" technique; pt able to initiate step with RLE after facilitation of weight shift.     Posture / Balance Dynamic Sitting Balance Sitting balance - Comments: requires min A statically.  Max - total A when he attempts to engage in activity  Balance Overall balance assessment: Needs assistance Sitting-balance support: Single extremity supported, Feet supported Sitting balance-Leahy Scale: Poor Sitting balance - Comments: requires min A statically.  Max - total A when he attempts to engage in activity  Postural control: Posterior lean, Left lateral lean Standing balance support: Single extremity supported Standing balance-Leahy Scale: Poor Standing balance comment: max A.  Pushes Lt     Special needs/care consideration BiPAP/CPAP No CPM No Continuous Drip IV 0.9% NS at 30 mL/hr Dialysis No       Life Vest No Oxygen No Special Bed No Trach Size No Wound Vac (area) No    Skin Dry skin                            Bowel mgmt: Last BM 02/22/17 Bladder mgmt: Foley catheter in place for urinary retention Diabetic mgmt No    Previous Home Environment Living  Arrangements: Children  Lives With: Family Available Help at Discharge: Family, Available 24 hours/day Type of Home: House Home Layout: One level Home Access: Stairs to enter Entrance Stairs-Rails: None Entrance Stairs-Number of Steps: 1 Home Care Services: No  Discharge Living Setting Plans for Discharge Living Setting: Patient's home, House, Lives with (comment) (Lives with son, 2 grand children.) Type of Home at Discharge: House Discharge Home Layout: One level Discharge Home Access: Stairs to enter Entrance Stairs-Number of Steps: 1 step entry Does the patient have any problems obtaining your medications?: No  Social/Family/Support Systems Patient Roles: Parent, Other (Comment) (Has a daughter, son, grandchilden.) Contact Information: Korde Jeppsen - daughter Anticipated Caregiver: Daughter and family Anticipated Caregiver's Contact Information: [name] - daughter - [phone] Ability/Limitations of Caregiver: Son works days, daughter has flexible schedule working as a Clinical biochemist.  Grandchildren ages 25, 5 and 35 can assist.  Boyfriend of 34 yo has CNA experience. Caregiver Availability: 24/7 (Dtr is committed to providing care at home after rehab.) Discharge Plan Discussed with Primary Caregiver: Yes Is Caregiver In Agreement with Plan?: Yes Does Caregiver/Family have Issues with Lodging/Transportation while Pt is in Rehab?: No  Goals/Additional Needs Patient/Family Goal for Rehab: PT/OT/SLP min to mod assist goals Expected length of stay: 14-17 days Cultural Considerations: Darrick Meigs, attends church with granddaughter at times. Dietary Needs: Dys 1, nectar thick liquids Equipment Needs: TBD Pt/Family Agrees to Admission and willing to participate: Yes (I spoke at length with daughter.) Program Orientation Provided & Reviewed with Pt/Caregiver Including Roles  & Responsibilities: Yes  Decrease burden of Care through IP rehab admission: N/A  Possible  need for SNF placement upon discharge: Not planned, family prefers to take patient home and provide care at home after rehab.  Patient Condition: This patient's medical and functional status has changed since the consult dated: 02/16/17 in  which the Rehabilitation Physician determined and documented that the patient's condition is appropriate for intensive rehabilitative care in an inpatient rehabilitation facility. See "History of Present Illness" (above) for medical update. Functional changes are: Currently requiring max assist for stand pivot transfers. Patient's medical and functional status update has been discussed with the Rehabilitation physician and patient remains appropriate for inpatient rehabilitation. Will admit to inpatient rehab today.  Preadmission Screen Completed By:  Retta Diones, 02/23/2017 3:20 PM ______________________________________________________________________   Discussed status with Dr.  Naaman Plummer on 02/23/17 at 1519 and received telephone approval for admission today.  Admission Coordinator:  Retta Diones, time 1519/Date 02/23/17       Revision History

## 2017-02-23 NOTE — Progress Notes (Signed)
Rehab admissions - I have approval for acute inpatient rehab admission for today.  I have called attending and have clearance for acute inpatient rehab admission for today.  Bed available and will admit to acute inpatient rehab today.  Call me for questions.  #[phone]

## 2017-02-24 ENCOUNTER — Encounter (HOSPITAL_COMMUNITY): Payer: Self-pay

## 2017-02-24 ENCOUNTER — Inpatient Hospital Stay (HOSPITAL_COMMUNITY): Payer: Self-pay | Admitting: Physical Therapy

## 2017-02-24 ENCOUNTER — Inpatient Hospital Stay (HOSPITAL_COMMUNITY): Payer: Medicare Other

## 2017-02-24 ENCOUNTER — Inpatient Hospital Stay (HOSPITAL_COMMUNITY): Payer: Medicare Other | Admitting: Physical Therapy

## 2017-02-24 ENCOUNTER — Inpatient Hospital Stay (HOSPITAL_COMMUNITY): Payer: Medicare Other | Admitting: Speech Pathology

## 2017-02-24 ENCOUNTER — Inpatient Hospital Stay (HOSPITAL_COMMUNITY): Payer: Medicare Other | Admitting: Occupational Therapy

## 2017-02-24 DIAGNOSIS — M79609 Pain in unspecified limb: Secondary | ICD-10-CM

## 2017-02-24 DIAGNOSIS — G8192 Hemiplegia, unspecified affecting left dominant side: Secondary | ICD-10-CM

## 2017-02-24 DIAGNOSIS — I619 Nontraumatic intracerebral hemorrhage, unspecified: Secondary | ICD-10-CM

## 2017-02-24 LAB — COMPREHENSIVE METABOLIC PANEL
ALT: 46 U/L (ref 17–63)
AST: 55 U/L — AB (ref 15–41)
Albumin: 3.8 g/dL (ref 3.5–5.0)
Alkaline Phosphatase: 46 U/L (ref 38–126)
Anion gap: 10 (ref 5–15)
BUN: 24 mg/dL — ABNORMAL HIGH (ref 6–20)
CHLORIDE: 104 mmol/L (ref 101–111)
CO2: 25 mmol/L (ref 22–32)
Calcium: 9.4 mg/dL (ref 8.9–10.3)
Creatinine, Ser: 0.9 mg/dL (ref 0.61–1.24)
Glucose, Bld: 156 mg/dL — ABNORMAL HIGH (ref 65–99)
POTASSIUM: 3.4 mmol/L — AB (ref 3.5–5.1)
SODIUM: 139 mmol/L (ref 135–145)
Total Bilirubin: 1.1 mg/dL (ref 0.3–1.2)
Total Protein: 6.8 g/dL (ref 6.5–8.1)

## 2017-02-24 LAB — CBC WITH DIFFERENTIAL/PLATELET
BASOS ABS: 0 10*3/uL (ref 0.0–0.1)
Basophils Relative: 0 %
EOS ABS: 0.1 10*3/uL (ref 0.0–0.7)
EOS PCT: 1 %
HCT: 38.7 % — ABNORMAL LOW (ref 39.0–52.0)
Hemoglobin: 12.9 g/dL — ABNORMAL LOW (ref 13.0–17.0)
LYMPHS PCT: 11 %
Lymphs Abs: 1.5 10*3/uL (ref 0.7–4.0)
MCH: 32.6 pg (ref 26.0–34.0)
MCHC: 33.3 g/dL (ref 30.0–36.0)
MCV: 97.7 fL (ref 78.0–100.0)
MONO ABS: 1.4 10*3/uL — AB (ref 0.1–1.0)
Monocytes Relative: 10 %
Neutro Abs: 10.8 10*3/uL — ABNORMAL HIGH (ref 1.7–7.7)
Neutrophils Relative %: 78 %
PLATELETS: 288 10*3/uL (ref 150–400)
RBC: 3.96 MIL/uL — ABNORMAL LOW (ref 4.22–5.81)
RDW: 13.3 % (ref 11.5–15.5)
WBC: 13.7 10*3/uL — ABNORMAL HIGH (ref 4.0–10.5)

## 2017-02-24 MED ORDER — POTASSIUM CHLORIDE CRYS ER 10 MEQ PO TBCR
10.0000 meq | EXTENDED_RELEASE_TABLET | Freq: Two times a day (BID) | ORAL | Status: DC
  Filled 2017-02-24: qty 1

## 2017-02-24 MED ORDER — POTASSIUM CHLORIDE 20 MEQ/15ML (10%) PO SOLN
10.0000 meq | Freq: Two times a day (BID) | ORAL | Status: DC
  Administered 2017-02-24 – 2017-03-18 (×44): 10 meq via ORAL
  Filled 2017-02-24 (×45): qty 15

## 2017-02-24 MED ORDER — TRAZODONE HCL 50 MG PO TABS
25.0000 mg | ORAL_TABLET | Freq: Every evening | ORAL | Status: DC | PRN
  Administered 2017-03-15 – 2017-03-16 (×2): 25 mg via ORAL
  Filled 2017-02-24 (×3): qty 1

## 2017-02-24 MED ORDER — PANTOPRAZOLE SODIUM 40 MG PO PACK
40.0000 mg | PACK | Freq: Every day | ORAL | Status: DC
  Administered 2017-02-24 – 2017-03-18 (×23): 40 mg via ORAL
  Filled 2017-02-24 (×20): qty 20

## 2017-02-24 NOTE — Progress Notes (Signed)
Sleepy as got trazodone 50 mg last night due to restlessness. Will need to work on sleep wake disruption--decrease trazodone to 25 mg prn. Will order UA/UCS due to leucocytosis. Daughter encouraged to work on hydration, IS and attempt to keep patient up during the day. Will add low dose K dur for hypokalemia

## 2017-02-24 NOTE — Progress Notes (Signed)
[place] PHYSICAL MEDICINE & REHABILITATION     PROGRESS NOTE    Subjective/Complaints: Had some difficulties sleeping last night. Slow to arouse this morning.  ROS: Limited due cognitive/behavioral   Objective: Vital Signs: Blood pressure (!) 124/56, pulse 82, temperature 98.5 F (36.9 C), temperature source Oral, resp. rate 17, weight 80 kg (176 lb 5.9 oz), SpO2 96 %. Dg Chest 2 View  Result Date: 02/24/2017 CLINICAL DATA:  Left-sided weakness and slurred speech EXAM: CHEST  2 VIEW COMPARISON:  02/22/2017 FINDINGS: Feeding tube removed. Cardiomegaly. Bibasilar atelectasis versus airspace disease left greater than right is stable. Tiny pleural effusions are suspected. No pneumothorax. IMPRESSION: Stable bibasilar atelectasis versus airspace disease. Electronically Signed   By: Marybelle Killings M.D.   On: 02/24/2017 08:54    Recent Labs  02/24/17 1010  WBC 13.7*  HGB 12.9*  HCT 38.7*  PLT 288    Recent Labs  02/24/17 1010  NA 139  K 3.4*  CL 104  GLUCOSE 156*  BUN 24*  CREATININE 0.90  CALCIUM 9.4   CBG (last 3)   Recent Labs  02/23/17 0759 02/23/17 1137 02/23/17 1626  GLUCAP 98 139* 104*    Wt Readings from Last 3 Encounters:  02/24/17 80 kg (176 lb 5.9 oz)  02/23/17 85.7 kg (189 lb)    Physical Exam:  Constitutional: He appears well-developed and well-nourished. He appears lethargic. He is easily aroused.  HENT:  Head: Normocephalic and atraumatic.  Eyes: PERRL.  Neck: Normal range of motion. Neck supple.  Cardiovascular: RRR.   No murmur heard. Respiratory: Normal effort. CTA.  GI: Soft. Bowel sounds are normal. He exhibits no distension. There is no tenderness. There is no rebound.  Genitourinary:  Genitourinary Comments: Foley in place.   Musculoskeletal: He exhibits no edema or tenderness.  Neurological:  He appears lethargic.  Left facial weakness with moderate to severe dysarthria persists.  Delayed reaction. Oriented to self.  Substantial  left inattention with hemiparesis.  Decreased sense of pain left arm/leg   Skin: Skin is warm and dry.  Psychiatric: lethargic, flat   Assessment/Plan: 1. Functional and cognitive deficits secondary to right thalamic hemorrhage which require 3+ hours per day of interdisciplinary therapy in a comprehensive inpatient rehab setting. Physiatrist is providing close team supervision and 24 hour management of active medical problems listed below. Physiatrist and rehab team continue to assess barriers to discharge/monitor patient progress toward functional and medical goals.  Function:  Bathing Bathing position Bathing activity did not occur: Safety/medical concerns (pt too lethargic to participate)    Bathing parts      Bathing assist        Upper Body Dressing/Undressing Upper body dressing   What is the patient wearing?: Hospital gown                Upper body assist        Lower Body Dressing/Undressing Lower body dressing   What is the patient wearing?: Hospital Gown, Non-skid slipper socks                              Lower body assist        Toileting Toileting     Toileting steps completed by helper: Adjust clothing after toileting, Performs perineal hygiene, Adjust clothing prior to toileting    Toileting assist Assist level: Two helpers   Transfers Chair/bed transfer   Chair/bed transfer method: Other Chair/bed transfer assist level:  dependent (Pt equals 0%) Chair/bed transfer assistive device: Mechanical lift Mechanical lift: Maximove   Locomotion Ambulation Ambulation activity did not occur: Safety/medical Editor, commissioning activity did not occur: Safety/medical concerns        Cognition Comprehension Comprehension assist level: Understands basic less than 25% of the time/ requires cueing >75% of the time (lethargic)  Expression Expression assist level: Expresses basic 25 - 49% of the time/requires cueing 50 - 75%  of the time. Uses single words/gestures. (lethargic)  Social Interaction Social Interaction assist level:  (Pt with pronounced lethargy)  Problem Solving Problem solving assist level:  (Baseline dementia but with extreme lethargy this session)  Memory Memory assist level:  (Baseline dementia)   Medical Problem List and Plan: 1.  Functional deficits secondary to right thalamic hemorrhage             -begin CIR therapies  -need to work to re-establish sleep-wake 2.  DVT Prophylaxis/Anticoagulation: Pharmaceutical: Lovenox 3. Pain Management: Tylenol prn.  4. Mood: LCSW to follow for evaluation and support when appropriate.  5. Neuropsych: This patient is not capable of making decisions on jisown behalf.  -limit neurosedating meds  -sleep chart 6. Skin/Wound Care: routine pressure relief measures. Maintain adequate nutritional and hydration status.  7. Fluids/Electrolytes/Nutrition:   Encourage nectar thick liquids intake.   -I personally reviewed the patient's labs today.   -BUN slightly elevated still 8. Dementia: Monitor sleep wake cycle. Maintain consistent schedule.  9. HTN:  Remains labile --will monitor BP qid. On Norvasc daily with Prinivil bid.  10. Pre- renal azotemia:   see above. may need PEG for adequate nutrition and hydration per reports.    11. Urinary retention: Foley placed on 3/14. Voiding trial as lethargy resolves.   -check UA,UCX given leukocytosis on labs this morning  12. Anemia:  Hgb 12.9.  13. Dysphagia: On dysphagia 1, nectar liquids with aspiration precautions.  14. Hyperglycemia: Hgb A1C- 5.5. Likely due to thickened juices for hydration.  15. Mild CHF due to Fluid overload: Monitor weights daily. Low salt diet.   -weight 80kg today    LOS (Days) 1 A FACE TO FACE EVALUATION WAS PERFORMED  Meredith Staggers, MD 02/24/2017 5:15 PM

## 2017-02-24 NOTE — Evaluation (Signed)
Speech Language Pathology Assessment and Plan  Patient Details  Name: Anthony Fisher MRN: [id number] Date of Birth: Jun 10, 1926  SLP Diagnosis: Cognitive Impairments  Rehab Potential: Fair ELOS: 26 to 28 days    Today's Date: 02/24/2017 SLP Individual Time: 8299-3716 SLP Individual Time Calculation (min): 15 min   Problem List:  Patient Active Problem List   Diagnosis Date Noted  . 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 IVH (intraventricular hemorrhage) ([place]) 02/23/2017  . Hypertensive emergency 02/23/2017  . Dysphagia due to recent stroke 02/23/2017  . Urinary retention 02/23/2017  . Dementia 02/23/2017  . Congestive heart failure ([place]) 02/23/2017  . Hypokalemia 02/23/2017  . Thalamic hemorrhage ([place]) 02/23/2017  . 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 02/16/2017   Past Medical History:  Past Medical History:  Diagnosis Date  . Adenomatous polyp of colon   . Dementia    Past Surgical History:  Past Surgical History:  Procedure Laterality Date  . CATARACT EXTRACTION W/ INTRAOCULAR LENS  IMPLANT, BILATERAL      Assessment / Plan / Recommendation Clinical Impression  Anthony Fisher is a 83 y.o. male with a history of dementia who was admitted on 02/15/17 with left-sided weakness, unsteady gait and fall. 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 CTA head/neck negative for stenosis, aneurysm or vascular malformation. MRI brain done revealing evolving right thalamic hemorrhage with old right caudate lacunar and advanced temporal lobe atropy--no CAA.  2 D echo with EF 60-65% with mild LVH and no motion abnormality. Labile blood pressures improving with adjustment of medications.  Patient with dysphagia due to sensorimotor as well as cognitive deficits--has been on tube feed for nutritional support.  Foley placed on 3/14  due to urinary retention needing in and out caths. MBS done 3/19 with initiation of dysphagia 1, nectar liquids. Lethargy resolving with improvement in participation. Patient with resultant left sided weakness, decreased balance at EOB, cognitive deficits needing needs max verbal and tactile cues to follow simple commands.   CIR recommended for follow up therapy and admited on 02/23/17. Speech-language evaluation were completed on 02/24/17 with goals set for focused attention given pt's significant lethargy. In addition to this initial goal, Modified Barium Swallow Report was reviewed with family and all information correctly entered and posted in pt's room. Further clinical evaluation of pt's swallow will be performed at next date when pt is able to participate. Pt requires skilled ST to increase functional independence and reduce caregiver education prior to discharge. Anticipate that pt will need 24 hour supervision at time of discharge d/t baseline of dementia further complicated by acute CVA. Anticipate that pt will require follow up ST at next venue of care.    Skilled Therapeutic Interventions          Skilled treatment session focused on Total A multimodal cues to promote alertness in preparation for possible PO intake. Pt unable to arouse at all. Initial cognitive goals set, POC discussed with daughter and granddaughter.    SLP Assessment  Patient will need skilled [facility] during CIR admission    Recommendations  Patient destination: Home Follow up Recommendations: Home Health SLP Equipment Recommended: To be determined    SLP Frequency 3 to 5 out of 7 days   SLP Duration  SLP Intensity  SLP Treatment/Interventions 26 to 28 days  Minumum of 1-2 x/day, 30 to 90 minutes  Cognitive remediation/compensation;Environmental controls;Patient/family education;Therapeutic Activities    Pain Pain Assessment Pain Assessment: Faces Faces Pain Scale: No hurt  Prior  Functioning Cognitive/Linguistic Baseline: Baseline deficits Baseline deficit details: Pt with baseline dementia with 24 hour supervision Type of Home: House  Lives With: Son (grandchildren) Available Help at Discharge: Family;Available 24 hours/day Vocation: Retired  Function:  Eating Eating   Modified Consistency Diet: Yes Eating Assist Level: Supervision or verbal cues;Helper checks for pocketed food           Cognition Comprehension Comprehension assist level: Understands basic less than 25% of the time/ requires cueing >75% of the time (lethargic)  Expression   Expression assist level: Expresses basic 25 - 49% of the time/requires cueing 50 - 75% of the time. Uses single words/gestures. (lethargic)  Social Interaction Social Interaction assist level:  (Pt with pronounced lethargy)  Problem Solving Problem solving assist level:  (Baseline dementia but with extreme lethargy this session)  Memory Memory assist level:  (Baseline dementia)   Short Term Goals: Week 1: SLP Short Term Goal 1 (Week 1): Pt will demonstrate focused attention to familiar task for ~5 minutes with Min A verbal cues.   Refer to Care Plan for Long Term Goals  Recommendations for other services: None   Discharge Criteria: Patient will be discharged from SLP if patient refuses treatment 3 consecutive times without medical reason, if treatment goals not met, if there is a change in medical status, if patient makes no progress towards goals or if patient is discharged from hospital.  The above assessment, treatment plan, treatment alternatives and goals were discussed and mutually agreed upon: by patient and by family   Mikaia Janvier B. Rutherford Nail, M.S., CCC-SLP Speech-Language Pathologist   Fiona Coto 02/24/2017, 3:38 PM

## 2017-02-24 NOTE — Evaluation (Signed)
Occupational Therapy Assessment and Plan  Patient Details  Name: Anthony Fisher MRN: [id number] Date of Birth: 1926-09-07  OT Diagnosis: abnormal posture, cognitive deficits, disturbance of vision and hemiplegia affecting non-dominant side Rehab Potential: Rehab Potential (ACUTE ONLY): Fair ELOS: 26-28 days   Today's Date: 02/24/2017 OT Individual Time: 6010-9323 OT Individual Time Calculation (min): 40 min  and Today's Date: 02/24/2017 OT Missed Time: 20 Minutes Missed Time Reason: Patient fatigue;Other (comment) (pt too lethargic to participate)    Problem List:  Patient Active Problem List   Diagnosis Date Noted  . 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 IVH (intraventricular hemorrhage) ([place]) 02/23/2017  . Hypertensive emergency 02/23/2017  . Dysphagia due to recent stroke 02/23/2017  . Urinary retention 02/23/2017  . Dementia 02/23/2017  . Congestive heart failure ([place]) 02/23/2017  . Hypokalemia 02/23/2017  . Thalamic hemorrhage ([place]) 02/23/2017  . 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 02/16/2017    Past Medical History:  Past Medical History:  Diagnosis Date  . Adenomatous polyp of colon   . Dementia    Past Surgical History:  Past Surgical History:  Procedure Laterality Date  . CATARACT EXTRACTION W/ INTRAOCULAR LENS  IMPLANT, BILATERAL      Assessment & Plan Clinical Impression: Patient transferred to CIR on 02/23/2017 .    Patient currently requires total with basic self-care skills secondary to muscle weakness, decreased cardiorespiratoy endurance, abnormal tone and unbalanced muscle activation, decreased visual perceptual skills and decreased visual motor skills, decreased midline orientation and left side neglect, decreased attention, decreased awareness and decreased memory and decreased sitting balance, decreased postural control and hemiplegia.  Prior to hospitalization, patient  could complete self care independently.  Patient will benefit from skilled intervention to increase independence with basic self-care skills prior to discharge home with care partner.  Anticipate patient will require 24 hour supervision and minimal physical assistance and follow up home health.  OT - End of Session Endurance Deficit Description: pt lethargic throughout session, opened eyes for a few seconds 2 x OT Assessment Rehab Potential (ACUTE ONLY): Fair OT Patient demonstrates impairments in the following area(s): Balance;Cognition;Motor;Endurance;Perception;Sensory;Vision;Safety OT Basic ADL's Functional Problem(s): Eating;Grooming;Bathing;Dressing;Toileting OT Transfers Functional Problem(s): Toilet;Tub/Shower OT Additional Impairment(s): Fuctional Use of Upper Extremity OT Plan OT Intensity: Minimum of 1-2 x/day, 45 to 90 minutes OT Frequency: 5 out of 7 days OT Duration/Estimated Length of Stay: 26-28 days OT Treatment/Interventions: Balance/vestibular training;Cognitive remediation/compensation;Discharge planning;DME/adaptive equipment instruction;Functional electrical stimulation;Functional mobility training;Neuromuscular re-education;Patient/family education;Self Care/advanced ADL retraining;Psychosocial support;Therapeutic Activities;Therapeutic Exercise;UE/LE Strength taining/ROM;UE/LE Coordination activities;Visual/perceptual remediation/compensation OT Self Feeding Anticipated Outcome(s): supervision OT Basic Self-Care Anticipated Outcome(s): min A OT Toileting Anticipated Outcome(s): min A OT Bathroom Transfers Anticipated Outcome(s): min A OT Recommendation Patient destination: Home Follow Up Recommendations: Home health OT Equipment Recommended: Tub/shower bench;3 in 1 bedside comode   Skilled Therapeutic Intervention Pt scheduled for OT initial evaluation this AM. PT attempted to see pt just prior, but pt too lethargic to engage. PT had used sternal rub, loud voice,  etc and he was not able to wake.  For OT, nursing working with pt and pt would still not wake. He eventually opened eyes but only for a few seconds.  Pt could not follow any commands, answer questions. Attempted to adjust pt's hips in w/c but pt could not assist.  The evaluation based on interview with his daughter, [name].    For treatment, used the time for education with her.  Demonstrated how to  tilt the tilt in space w/c every 30 -45 min for pressure relief and asked her to do that.   Demonstrated positioning with rolled towels to support L side and pillow under arm.  Also worked with PROM for LUE. Education with daughter on PROM, shoulder ROM precautions, stimulation through touch and massage and encouraged family to do that.  Discussed OT goals and that min A goals would be set as a starting point.  Depending on his progress those goals may need to be modified.  She agreed with POC.  Pt in reclined position with daughter in the room.  OT Evaluation Precautions/Restrictions  Precautions Precautions: Fall Restrictions Weight Bearing Restrictions: No General OT Amount of Missed Time: 20 Minutes   Pain Pain Assessment Pain Assessment: Faces Faces Pain Scale: No hurt Home Living/Prior Functioning Home Living Family/patient expects to be discharged to:: Private residence Living Arrangements: Children Available Help at Discharge: Family, Available 24 hours/day Type of Home: House Home Access: Stairs to enter Technical brewer of Steps: 1 Entrance Stairs-Rails: None Home Layout: One level Bathroom Shower/Tub: Tub/shower unit, Architectural technologist: Standard  Lives With: Son (& grandchildren) Prior Function Level of Independence: Independent with basic ADLs, Independent with gait, Independent with transfers  Able to Take Stairs?: Yes Driving: No Vocation: Retired Comments: Pt was very active and independent PTA but with full S due to memory deficits ADL ADL ADL Comments:  refer to functional navigator Vision/Perception  Vision- History Baseline Vision/History: Wears glasses Wears Glasses: At all times Patient Visual Report:  (unable to assess 2/2 pt lethargy) Vision- Assessment Vision Assessment?: Vision impaired- to be further tested in functional context (unable to assess due to severe lethargy)  Cognition Overall Cognitive Status: Difficult to assess Arousal/Alertness: Lethargic Orientation Level:  (unable to assess due to severe lethargy) Sensation Sensation Light Touch:  (unable to assess due to severe lethargy) Motor  Motor Motor: Hemiplegia;Abnormal postural alignment and control Motor - Skilled Clinical Observations: unable to assess accurately due to severe lethargy, but does have slight active movement in L hand and strong L lean Mobility  Bed Mobility Bed Mobility: Rolling Right;Rolling Left Rolling Right: 1: +1 Total assist Rolling Right Details: Manual facilitation for placement;Manual facilitation for weight shifting Rolling Left: 1: +1 Total assist Rolling Left Details: Manual facilitation for weight shifting;Manual facilitation for placement  Trunk/Postural Assessment  Postural Control Postural Control: Deficits on evaluation Trunk Control: severely impaired with L lean  Balance Balance Balance Assessed: Yes Static Sitting Balance Static Sitting - Level of Assistance: 1: +1 Total assist (difficult to assess due to severe lethargy) Extremity/Trunk Assessment RUE Assessment RUE Assessment: Not tested (unable to assess due to severe lethargy) LUE Assessment LUE Assessment: Not tested (unable to assess due to severe lethargy, but appears to have some active hand movement in otherwise flaccid arm)   See Function Navigator for Current Functional Status.   Refer to Care Plan for Long Term Goals  Recommendations for other services: None    Discharge Criteria: Patient will be discharged from OT if patient refuses treatment 3  consecutive times without medical reason, if treatment goals not met, if there is a change in medical status, if patient makes no progress towards goals or if patient is discharged from hospital.  The above assessment, treatment plan, treatment alternatives and goals were discussed and mutually agreed upon: by family, daughter [name])  Meriel Pica 02/24/2017, 12:51 PM

## 2017-02-24 NOTE — Progress Notes (Signed)
Patient information reviewed and entered into eRehab system by Halee Glynn, RN, CRRN, PPS Coordinator.  Information including medical coding and functional independence measure will be reviewed and updated through discharge.     Per nursing patient was given "Data Collection Information Summary for Patients in Inpatient Rehabilitation Facilities with attached "Privacy Act Statement-Health Care Records" upon admission.  

## 2017-02-24 NOTE — Progress Notes (Signed)
Pt was received to the unit with his daughter accompaning him. Pt has had full body assessment and skin check with sandra RN. Pt and daughter have been oriented to the room and given materials regarding [employer] and other materials for education. Fall protocol was reviewed and signed by Benjamine Mola RN and daughter of patient. It is hanging on the door of the patients bathroom. Schedule for tomorrow for PT, OT, and ST was reviewed with the daughter, pt is stable and resting at present with daughter at bedside.

## 2017-02-24 NOTE — H&P (Signed)
Physical Medicine and Rehabilitation Admission H&P    Chief Complaint  Patient presents with  . Left sided weakness, confusion, slurred speech and dysphagia    HPI:  Anthony Fisher is a 83 y.o. male with a history of dementia who was admitted on 02/15/17 with left-sided weakness, unsteady gait and fall. 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 CTA head/neck negative for stenosis, aneurysm or vascular malformation. MRI brain done revealing evolving right thalamic hemorrhage with old right caudate lacunar and advanced temporal lobe atropy--no CAA.  2 D echo with EF 60-65% with mild LVH and no motion abnormality. Labile blood pressures improving with adjustment of medications.  Patient with dysphagia due to sensorimotor as well as cognitive deficits--has been on tube feed for nutritional support.  Foley placed on 3/14 due to urinary retention needing in and out caths. MBS done 3/19 with initiation of dysphagia 1, nectar liquids. Lethargy resolving with improvement in participation. Patient with resultant left sided weakness, decreased balance at EOB, cognitive deficits needing needs max verbal and tactile cues to follow simple commands. CIR recommended for follow up therapy.    Review of Systems  Unable to perform ROS: Mental acuity  Eyes: Positive for blurred vision.  Respiratory: Negative for shortness of breath.   Cardiovascular: Negative for chest pain.  Gastrointestinal: Negative for abdominal pain.  Musculoskeletal: Positive for neck pain (family reports decreased ROM ). Negative for back pain.  Neurological: Positive for speech change and focal weakness.  Psychiatric/Behavioral: Positive for memory loss. The patient has insomnia (Snacks at night/sleeps on and off during the day).       Past Medical History:  Diagnosis Date  . Adenomatous polyp of colon   . Dementia     Past Surgical History:    Procedure Laterality Date  . CATARACT EXTRACTION W/ INTRAOCULAR LENS  IMPLANT, BILATERAL        Family History : Unable to state.     Social History:  Lives with family. Independent PTA--goes out to breakfast with daughter daily. Sleeps on and off during the day. Per  reports that he has never smoked. He has never used smokeless tobacco. He reports that he does not drink alcohol or use drugs.    Allergies  Allergen Reactions  . Tape Other (See Comments)    SKIN IS THIN (MAY TEAR AND/OR BRUISE EASILY)    Medications Prior to Admission  Medication Sig Dispense Refill  . polyethylene glycol powder (GLYCOLAX/MIRALAX) powder Take 17 g by mouth daily.      Home:     Functional History:    Functional Status:  Mobility: max to total assist for basic transfers and mobility          ADL: mat to total assist    Cognition: mod to max assist       There were no vitals taken for this visit. Physical Exam  Nursing note and vitals reviewed. Constitutional: He appears well-developed and well-nourished. He appears lethargic. He is easily aroused.  HENT:  Head: Normocephalic and atraumatic.  Eyes: Conjunctivae and EOM are normal. Pupils are equal, round, and reactive to light.  Neck: Normal range of motion. Neck supple.  Cardiovascular: Normal rate and regular rhythm.  Exam reveals no friction rub.   No murmur heard. Respiratory: Effort normal and breath sounds normal. No stridor. No respiratory distress. He has no wheezes.  GI: Soft. Bowel sounds are normal. He exhibits  no distension. There is no tenderness. There is no rebound.  Genitourinary:  Genitourinary Comments: Foley in place.   Musculoskeletal: He exhibits no edema or tenderness.  Neurological: He is easily aroused. He appears lethargic.  Left facial weakness with moderate to severe dysarthria. Oriented to self only. Distracted and kept looking at daughter for answers. Needed cues to stay awake and follow simple  motor commands. Substantial left inattention with hemiparesis. Would not engage with left arm or leg today.   Skin: Skin is warm and dry.  Psychiatric: His affect is inappropriate. His speech is slurred. Cognition and memory are impaired. He expresses inappropriate judgment. He is noncommunicative. He is inattentive.    Results for orders placed or performed during the hospital encounter of 02/15/17 (from the past 48 hour(s))  Glucose, capillary     Status: Abnormal   Collection Time: 02/22/17  5:00 AM  Result Value Ref Range   Glucose-Capillary 108 (H) 65 - 99 mg/dL  Glucose, capillary     Status: Abnormal   Collection Time: 02/22/17  8:24 AM  Result Value Ref Range   Glucose-Capillary 134 (H) 65 - 99 mg/dL  Glucose, capillary     Status: Abnormal   Collection Time: 02/22/17 11:50 AM  Result Value Ref Range   Glucose-Capillary 130 (H) 65 - 99 mg/dL  Glucose, capillary     Status: Abnormal   Collection Time: 02/22/17  4:13 PM  Result Value Ref Range   Glucose-Capillary 107 (H) 65 - 99 mg/dL   Comment 1 Notify RN    Comment 2 Document in Chart   Glucose, capillary     Status: Abnormal   Collection Time: 02/22/17  7:32 PM  Result Value Ref Range   Glucose-Capillary 106 (H) 65 - 99 mg/dL   Comment 1 Notify RN    Comment 2 Document in Chart   Glucose, capillary     Status: Abnormal   Collection Time: 02/23/17 12:02 AM  Result Value Ref Range   Glucose-Capillary 114 (H) 65 - 99 mg/dL   Comment 1 Notify RN    Comment 2 Document in Chart   Glucose, capillary     Status: Abnormal   Collection Time: 02/23/17  4:06 AM  Result Value Ref Range   Glucose-Capillary 116 (H) 65 - 99 mg/dL   Comment 1 Notify RN    Comment 2 Document in Chart   Glucose, capillary     Status: None   Collection Time: 02/23/17  7:59 AM  Result Value Ref Range   Glucose-Capillary 98 65 - 99 mg/dL   Comment 1 Notify RN    Comment 2 Document in Chart   Glucose, capillary     Status: Abnormal   Collection  Time: 02/23/17 11:37 AM  Result Value Ref Range   Glucose-Capillary 139 (H) 65 - 99 mg/dL  Glucose, capillary     Status: Abnormal   Collection Time: 02/23/17  4:26 PM  Result Value Ref Range   Glucose-Capillary 104 (H) 65 - 99 mg/dL   Comment 1 Notify RN    Comment 2 Document in Chart     Dg Chest Port 1 View  Result Date: 02/22/2017 CLINICAL DATA:  Chest congestion . EXAM: PORTABLE CHEST 1 VIEW COMPARISON:  02/21/2017 . FINDINGS: Feeding tube noted with its tip below left hemidiaphragm. Cardiomegaly with mild bilateral interstitial prominence and small bilateral pleural effusions consistent with CHF. No pneumothorax . IMPRESSION: 1.  Feeding tube noted with tip below left hemidiaphragm. 2. Cardiomegaly with  diffuse bilateral from interstitial prominence and small bilateral pleural effusions consistent with mild CHF. 3. Persistent mild basilar subsegmental atelectasis . Electronically Signed   By: Marcello Moores  Register   On: 02/22/2017 07:13    Medical Problem List and Plan: 1.  Functional deficits secondary to right thalamic hemorrhage  -admit to inpatient rehab 2.  DVT Prophylaxis/Anticoagulation: Pharmaceutical: Lovenox 3. Pain Management: Tylenol prn.  4. Mood: LCSW to follow for evaluation and support when appropriate.  5. Neuropsych: This patient is not capable of making decisions on jisown behalf. 6. Skin/Wound Care: routine pressure relief measures. Maintain adequate nutritional and hydration status.  7. Fluids/Electrolytes/Nutrition: Monitor I/O. Check lytes in am. Encourage nectar thick liquids intake.  8. Dementia: Monitor sleep wake cycle. Maintain consistent schedule.  9. HTN:  Remains labile --will monitor BP qid. On Norvasc daily with Prinivil bid.  10. Pre- renal azotemia:  Worsening of renal status with removal of panda. Encourage po fluid intake. Discontinue IVF--may need PEG for adequate nutrition and hydration per reports.    11. Urinary retention: Foley placed on 3/14.  Voiding trial as lethargy resolves.   12. Anemia: Recheck CBC in am.  13. Dysphagia: On dysphagia 1, nectar liquids with aspiration precautions.  14. Hyperglycemia: Hgb A1C- 5.5. Likely due to thickened juices for hydration.  15. Mild CHF due to Fluid overload: Monitor weights daily. Low salt diet.     Post Admission Physician Evaluation: 1. Functional deficits secondary  to right thalamic hemorrhage. 2. Patient is admitted to receive collaborative, interdisciplinary care between the physiatrist, rehab nursing staff, and therapy team. 3. Patient's level of medical complexity and substantial therapy needs in context of that medical necessity cannot be provided at a lesser intensity of care such as a SNF. 4. Patient has experienced substantial functional loss from his/her baseline which was documented above under the "Functional History" and "Functional Status" headings.  Judging by the patient's diagnosis, physical exam, and functional history, the patient has potential for functional progress which will result in measurable gains while on inpatient rehab.  These gains will be of substantial and practical use upon discharge  in facilitating mobility and self-care at the household level. 5. Physiatrist will provide 24 hour management of medical needs as well as oversight of the therapy plan/treatment and provide guidance as appropriate regarding the interaction of the two. 6. The Preadmission Screening has been reviewed and patient status is unchanged unless otherwise stated above. 7. 24 hour rehab nursing will assist with bladder management, bowel management, safety, skin/wound care, disease management, medication administration, pain management and patient education  and help integrate therapy concepts, techniques,education, etc. 8. PT will assess and treat for/with: Lower extremity strength, range of motion, stamina, balance, functional mobility, safety, adaptive techniques and equipment, NMR,  arousal/engagement, family education.   Goals are: min to mod assist. 9. OT will assess and treat for/with: ADL's, functional mobility, safety, upper extremity strength, adaptive techniques and equipment, NMR, community reintegration, family education.   Goals are: min to mod assist. Therapy may not yet proceed with showering this patient. 10. SLP will assess and treat for/with: cognition, communication, swallowing, family education.  Goals are: min to mod assist. 11. Case Management and Social Worker will assess and treat for psychological issues and discharge planning. 12. Team conference will be held weekly to assess progress toward goals and to determine barriers to discharge. 13. Patient will receive at least 3 hours of therapy per day at least 5 days per week. 14. ELOS: 25-28 days  15. Prognosis:  excellent     Meredith Staggers, MD, [facility] 02/24/2017  Meredith Staggers, MD 02/24/2017

## 2017-02-24 NOTE — Evaluation (Signed)
Physical Therapy Assessment and Plan  Patient Details  Name: Anthony Fisher MRN: [id number] Date of Birth: [birth year]  PT Diagnosis: Difficulty walking, Impaired cognition, Impaired sensation and Muscle weakness Rehab Potential: Fair ELOS: 3-4 weeks   Today's Date: 02/24/2017 PT Individual Time: 0800-0900 PT Individual Time Calculation (min): 60 min  Problem List:  Patient Active Problem List   Diagnosis Date Noted  . 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 IVH (intraventricular hemorrhage) ([place]) 02/23/2017  . Hypertensive emergency 02/23/2017  . Dysphagia due to recent stroke 02/23/2017  . Urinary retention 02/23/2017  . Dementia 02/23/2017  . Congestive heart failure ([place]) 02/23/2017  . Hypokalemia 02/23/2017  . Thalamic hemorrhage ([place]) 02/23/2017  . 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 02/16/2017    Past Medical History:  Past Medical History:  Diagnosis Date  . Adenomatous polyp of colon   . Dementia    Past Surgical History:  Past Surgical History:  Procedure Laterality Date  . CATARACT EXTRACTION W/ INTRAOCULAR LENS  IMPLANT, BILATERAL      Assessment & Plan Clinical Impression: Patient is a 83 y.o. year old male with a history of dementia who was admitted on 02/15/17 with left-sided weakness, unsteady gait and fall. 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 CTA head/neck negative for stenosis, aneurysm or vascular malformation. MRI brain done revealing evolving right thalamic hemorrhage with old right caudate lacunar and advanced temporal lobe atropy--no CAA.  2 D echo with EF 60-65% with mild LVH and no motion abnormality. Labile blood pressures improving with adjustment of medications.  Patient with dysphagia due to sensorimotor as well as cognitive deficits--has been on tube feed for nutritional support.   Foley placed on 3/14 due to urinary retention needing in and out caths. MBS done 3/19 with initiation of dysphagia 1, nectar liquids. Lethargy resolving with improvement in participation. Patient with resultant left sided weakness, decreased balance at EOB, cognitive deficits needing needs max verbal and tactile cues to follow simple commands. CIR recommended for follow up therapy.  Patient transferred to CIR on 02/23/2017 .   Patient currently requires total with mobility secondary to muscle weakness, decreased cardiorespiratoy endurance, decreased coordination, decreased initiation, decreased attention, decreased awareness, decreased problem solving, decreased safety awareness, decreased memory and delayed processing and decreased sitting balance, decreased standing balance, decreased postural control and decreased balance strategies.  Prior to hospitalization, patient was independent  with mobility and lived with Son (& grandchildren) in a House home.  Home access is 1Stairs to enter.  Patient will benefit from skilled PT intervention to maximize safe functional mobility, minimize fall risk and decrease caregiver burden for planned discharge home with 24 hour assist.  Anticipate patient will benefit from follow up [facility] at discharge.  PT - End of Session Endurance Deficit Description: pt lethargic throughout session, unable to open eyes PT Assessment Rehab Potential (ACUTE/IP ONLY): Fair PT Patient demonstrates impairments in the following area(s): Balance;Behavior;Endurance;Motor;Pain;Perception;Safety PT Transfers Functional Problem(s): Bed Mobility;Bed to Chair;Car;Furniture PT Locomotion Functional Problem(s): Ambulation;Wheelchair Mobility;Stairs PT Plan PT Intensity: Minimum of 1-2 x/day ,45 to 90 minutes PT Frequency: 5 out of 7 days PT Duration Estimated Length of Stay: 3-4 weeks PT Treatment/Interventions: Ambulation/gait training;DME/adaptive equipment instruction;Community  reintegration;Neuromuscular re-education;Psychosocial support;Stair training;UE/LE Strength taining/ROM;Wheelchair propulsion/positioning;UE/LE Coordination activities;Therapeutic Activities;Skin care/wound management;Pain management;Functional electrical stimulation;Balance/vestibular training;Discharge planning;Cognitive remediation/compensation;Disease management/prevention;Functional mobility training;Patient/family education;Splinting/orthotics;Therapeutic Exercise;Visual/perceptual remediation/compensation PT Transfers Anticipated Outcome(s): mod assist overall  PT Locomotion Anticipated Outcome(s): mod assist overall PT Recommendation Recommendations for Other Services: Speech consult Follow Up Recommendations: Home health PT;24 hour supervision/assistance Patient destination: Home Equipment Recommended: To be determined  Skilled Therapeutic Intervention Treatment 1: Pt received asleep in bed with daughter [name]) present. Educated [name] on weekly interdisciplinary team meeting, daily therapy schedule, and answered all questions. [name] provided pt's PLOF and home set up information. Pt very difficult to arouse and remained lethargic throughout evaluation. Attempted to awaken pt with wash cloth on face, verbal/tactile stimulation, sternal rub, and attempting to transfer him to EOB but pt only able to open eyes then immediatly close them again. Provided pt with TIS w/c and transferred pt bed>w/c via [facility] +2 assist. At end of session pt left sitting in TIS w/c with QRB donned & daughter present to supervise.  Treatment 2: Attempted to see pt for scheduled afternoon treatment session. Pt sleeping soundly in bed with granddaughter present and reporting pt has not been able to wake up and participate in most therapy sessions today. Pt missed 30 minutes of skilled PT treatment 2/2 significant lethargy. Will f/u per plan of care.  PT Evaluation Precautions/Restrictions Precautions Precautions:  Fall Restrictions Weight Bearing Restrictions: No   General Chart Reviewed: Yes Additional Pertinent History: hx mild dementia Response to Previous Treatment: Not applicable Family/Caregiver Present: Yes (daughter [name])   Pain Pain Assessment Pain Assessment: Faces Faces Pain Scale: No hurt  Home Living/Prior Functioning Home Living Available Help at Discharge: Family;Available 24 hours/day Type of Home: House Home Access: Stairs to enter [employer] of Steps: 1 Entrance Stairs-Rails: None Home Layout: One level  Lives With: Son (& grandchildren) Prior Function Level of Independence: Independent with basic ADLs;Independent with gait;Independent with transfers  Able to Take Stairs?: Yes Driving: No  Vision/Perception  Pt wears glasses at all times at baseline. Unable to assess vision during evaluation 2/2 pt lethargy.  Cognition Overall Cognitive Status: Difficult to assess Arousal/Alertness: Lethargic  Mobility Bed Mobility Bed Mobility: Rolling Right;Rolling Left Rolling Right: 1: +1 Total assist Rolling Right Details: Manual facilitation for placement;Manual facilitation for weight shifting Rolling Left: 1: +1 Total assist Rolling Left Details: Manual facilitation for weight shifting;Manual facilitation for placement Transfers Transfers: Yes Transfer via Lift Equipment: Maximove   See Function Navigator for Current Functional Status.   Refer to Care Plan for Long Term Goals  Recommendations for other services: Other: Speech Therapy  Discharge Criteria: Patient will be discharged from PT if patient refuses treatment 3 consecutive times without medical reason, if treatment goals not met, if there is a change in medical status, if patient makes no progress towards goals or if patient is discharged from hospital.  The above assessment, treatment plan, treatment alternatives and goals were discussed and mutually agreed upon: by family  [place] 02/24/2017, 4:30 PM

## 2017-02-24 NOTE — Plan of Care (Signed)
Problem: RH Balance Goal: LTG Patient will maintain dynamic standing balance (PT) LTG:  Patient will maintain dynamic standing balance with assistance during mobility activities (PT) With LRAD  Problem: RH Ambulation Goal: LTG Patient will ambulate in controlled environment (PT) LTG: Patient will ambulate in a controlled environment, # of feet with assistance (PT). 15 ft with LRAD  Problem: RH Wheelchair Mobility Goal: LTG Patient will propel w/c in controlled environment (PT) LTG: Patient will propel wheelchair in controlled environment, # of feet with assist (PT) 100 ft Goal: LTG Patient will propel w/c in home environment (PT) LTG: Patient will propel wheelchair in home environment, # of feet with assistance (PT). 50 ft  Problem: RH Other (Specify) Goal: RH LTG Other (Specify)1 Pt's caregiver will demonstrate bumping pt up 1 step in w/c with good safety awareness & supervision.

## 2017-02-24 NOTE — Progress Notes (Signed)
VASCULAR LAB PRELIMINARY  PRELIMINARY  PRELIMINARY  PRELIMINARY  Bilateral lower extremity venous duplex completed.    Preliminary report:  Bilateral:  No evidence of DVT, superficial thrombosis, or Baker's Cyst.   Anthony Fisher, RVS 02/24/2017, 2:13 PM

## 2017-02-25 ENCOUNTER — Inpatient Hospital Stay (HOSPITAL_COMMUNITY): Payer: Medicare Other | Admitting: Occupational Therapy

## 2017-02-25 ENCOUNTER — Inpatient Hospital Stay (HOSPITAL_COMMUNITY): Payer: Medicare Other | Admitting: Speech Pathology

## 2017-02-25 ENCOUNTER — Inpatient Hospital Stay (HOSPITAL_COMMUNITY): Payer: Medicare Other | Admitting: Physical Therapy

## 2017-02-25 ENCOUNTER — Inpatient Hospital Stay (HOSPITAL_COMMUNITY): Payer: Self-pay

## 2017-02-25 DIAGNOSIS — N390 Urinary tract infection, site not specified: Secondary | ICD-10-CM

## 2017-02-25 DIAGNOSIS — A499 Bacterial infection, unspecified: Secondary | ICD-10-CM

## 2017-02-25 DIAGNOSIS — R7989 Other specified abnormal findings of blood chemistry: Secondary | ICD-10-CM

## 2017-02-25 LAB — URINALYSIS, ROUTINE W REFLEX MICROSCOPIC
Bilirubin Urine: NEGATIVE
GLUCOSE, UA: NEGATIVE mg/dL
Ketones, ur: NEGATIVE mg/dL
NITRITE: NEGATIVE
SQUAMOUS EPITHELIAL / LPF: NONE SEEN
Specific Gravity, Urine: 1.018 (ref 1.005–1.030)
pH: 8 (ref 5.0–8.0)

## 2017-02-25 MED ORDER — CEPHALEXIN 250 MG PO CAPS
250.0000 mg | ORAL_CAPSULE | Freq: Three times a day (TID) | ORAL | Status: DC
  Administered 2017-02-25 – 2017-02-27 (×6): 250 mg via ORAL
  Filled 2017-02-25 (×6): qty 1

## 2017-02-25 NOTE — Progress Notes (Signed)
Occupational Therapy Session Note  Patient Details  Name: Anthony Fisher MRN: [id number] Date of Birth: 20-Dec-1925  Today's Date: 02/25/2017 OT Individual Time: 1445-1530 OT Individual Time Calculation (min): 45 min    Short Term Goals: Week 1:  OT Short Term Goal 1 (Week 1): Pt will be able to sit at EOB with mod A to engage in self care. OT Short Term Goal 2 (Week 1): Pt will be able to squat pivot to  BSC with max A of 1. OT Short Term Goal 3 (Week 1): Pt will bathe UB with min A. OT Short Term Goal 4 (Week 1): Pt will don shirt with mod A.  Skilled Therapeutic Interventions/Progress Updates:    1:1. No c/o pain. Focus of session on functional transfers, sitting balance and weight shifting to R in prep for functional transfers/sitting balance. Pt supine>sit EOB> squat pivot transfer with MAX A for manual facilitation of weight shifting, VC for sequencing and tactile cues for midline orientation. Pt squat pivot transfers w/c to/from EOM same as stated. Pt reaches to grab playing card to R to facilitate weight shift away from weak side and attempts to place card on match in front of him. Pt requires MAX A-TOTAL A and VC and tactile cues to maintain midline orientation while seated. Pt required VC to lift head to scan for matching card and HOH A to help pt reach to appropriate card. Pt consistently trying to put card to lower R of board. Pt transfers back to w/c in same way stated above. Pt returned to room seated in w/c with daughter present, QRB donned and call light in reach.  Therapy Documentation Precautions:  Precautions Precautions: Fall Restrictions Weight Bearing Restrictions: No Pain:   ADL: ADL ADL Comments: refer to functional navigator Exercises:   Other Treatments:    See Function Navigator for Current Functional Status.   Therapy/Group: Individual Therapy  Tonny Branch 02/25/2017, 3:51 PM

## 2017-02-25 NOTE — Evaluation (Signed)
Speech Language Pathology Bedside Swallow Evaluation   Patient Details  Name: Anthony Fisher MRN: [id number] Date of Birth: March 29, 1926  SLP Diagnosis: Dysphagia;Dysarthria  Rehab Potential: Fair ELOS: 26 to 28 days    Today's Date: 02/25/2017 SLP Individual Time: 1015-1100 SLP Individual Time Calculation (min): 45 min   Problem List:  Patient Active Problem List   Diagnosis Date Noted  . 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 IVH (intraventricular hemorrhage) ([place]) 02/23/2017  . Hypertensive emergency 02/23/2017  . Dysphagia due to recent stroke 02/23/2017  . Urinary retention 02/23/2017  . Dementia 02/23/2017  . Congestive heart failure ([place]) 02/23/2017  . Hypokalemia 02/23/2017  . Thalamic hemorrhage ([place]) 02/23/2017  . 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 02/16/2017   Past Medical History:  Past Medical History:  Diagnosis Date  . Adenomatous polyp of colon   . Dementia    Past Surgical History:  Past Surgical History:  Procedure Laterality Date  . CATARACT EXTRACTION W/ INTRAOCULAR LENS  IMPLANT, BILATERAL      Assessment / Plan / Recommendation Clinical Impression  Upon arrival, patient was awake but appeared lethargic with intermittent verbal cues needed to keep his eyes open throughout session. Patient demonstrated a moderate oral dysphagia characterized by weak labial seal, prolonged AP transit, left anterior spillage and oral residue with all trials. Patient consumed Dys. 1 textures with nectar-thick liquids via straw without overt s/s of aspiration and demonstrated what appeared to be a delayed swallow initiation with Mod verbal cues needed for small bites/sips. Patient with decreased management of secretions throughout session and required overall Max A verbal cues. SLP also provided oral care at end of session due to increased secretions and oral residue. Recommend patient  continue current diet of Dys. 1 textures with nectar-thick liquids via straw with full supervision to maximize safety.    Skilled Therapeutic Interventions          Administered a BSE. Please see above for details. Patient required Max A verbal cues for management of saliva throughout session and to self-monitor and correct left anterior spillage throughout trials. Patient's daughter present and educated on goals of skilled SLP intervention, she verbalized understanding.    SLP Assessment  Patient will need skilled Speech Lanaguage Pathology Services during CIR admission    Recommendations  SLP Diet Recommendations: Dysphagia 1 (Puree);Nectar Liquid Administration via: Straw Medication Administration: Crushed with puree Supervision: Patient able to self feed;Full supervision/cueing for compensatory strategies;Staff to assist with self feeding Compensations: Slow rate;Small sips/bites;Minimize environmental distractions;Follow solids with liquid Postural Changes and/or Swallow Maneuvers: Seated upright 90 degrees Oral Care Recommendations: Oral care BID Patient destination: Home Follow up Recommendations: Home Health SLP;24 hour supervision/assistance Equipment Recommended: To be determined    SLP Frequency 3 to 5 out of 7 days   SLP Duration  SLP Intensity  SLP Treatment/Interventions 26 to 28 days  Minumum of 1-2 x/day, 30 to 90 minutes  Therapeutic Activities;Functional tasks;Dysphagia/aspiration precaution training;Cueing hierarchy;Speech/Language facilitation;Patient/family education    Pain Intermittent pain near foley catheter. RN aware.   Function:  Eating Eating   Modified Consistency Diet: Yes Eating Assist Level: More than reasonable amount of time;Supervision or verbal cues;Helper checks for pocketed food;Helper feeds patient           Cognition Comprehension Comprehension assist level: Understands basic 25 - 49% of the time/ requires cueing 50 - 75% of the  time  Expression   Expression assist level: Expresses basis  less than 25% of the time/requires cueing >75% of the time.  Social Interaction Social Interaction assist level: Interacts appropriately 25 - 49% of time - Needs frequent redirection.  Problem Solving Problem solving assist level: Solves basic less than 25% of the time - needs direction nearly all the time or does not effectively solve problems and may need a restraint for safety  Memory Memory assist level: Recognizes or recalls less than 25% of the time/requires cueing greater than 75% of the time   Short Term Goals: Week 1: SLP Short Term Goal 1 (Week 1): Pt will demonstrate focused attention to familiar task for ~5 minutes with Min A verbal cues.  SLP Short Term Goal 2 (Week 1): Patient will utilize an increased vocal intensity to maximize speech intelligibility to ~90% at the phrase level with Min A verbal cues.  SLP Short Term Goal 3 (Week 1): Patient will consume current diet with minimal overt s/s of aspiration with Max A verbal cues for use of swallowing compenstory strategies.  SLP Short Term Goal 4 (Week 1): Patient will consume trials of ice chips with overt s/s of aspiration in 25% of trials or less over 2 sessions with Mod A verbal cues to assess readiness for repeat MBS.  SLP Short Term Goal 5 (Week 1): Patient will demonstrate management of secretions and will initiate swallowing his saliva in 50% of opportunities with Mod A verbal cues.   Refer to Care Plan for Long Term Goals  Recommendations for other services: None   Discharge Criteria: Patient will be discharged from SLP if patient refuses treatment 3 consecutive times without medical reason, if treatment goals not met, if there is a change in medical status, if patient makes no progress towards goals or if patient is discharged from hospital.  The above assessment, treatment plan, treatment alternatives and goals were discussed and mutually agreed upon: by patient  and by family  Anthony Fisher 02/25/2017, 4:39 PM

## 2017-02-25 NOTE — Progress Notes (Signed)
PHYSICAL MEDICINE & REHABILITATION     PROGRESS NOTE    Subjective/Complaints: Slept much better last night. Up with OT and participating this am  ROS: Limited due cognitive/behavioral   Objective: Vital Signs: Blood pressure 136/78, pulse 79, temperature 98.9 F (37.2 C), temperature source Oral, resp. rate 18, weight 79 kg (174 lb 2.6 oz), SpO2 97 %. Dg Chest 2 View  Result Date: 02/24/2017 CLINICAL DATA:  Left-sided weakness and slurred speech EXAM: CHEST  2 VIEW COMPARISON:  02/22/2017 FINDINGS: Feeding tube removed. Cardiomegaly. Bibasilar atelectasis versus airspace disease left greater than right is stable. Tiny pleural effusions are suspected. No pneumothorax. IMPRESSION: Stable bibasilar atelectasis versus airspace disease. Electronically Signed   By: Marybelle Killings M.D.   On: 02/24/2017 08:54    Recent Labs  02/24/17 1010  WBC 13.7*  HGB 12.9*  HCT 38.7*  PLT 288    Recent Labs  02/24/17 1010  NA 139  K 3.4*  CL 104  GLUCOSE 156*  BUN 24*  CREATININE 0.90  CALCIUM 9.4   CBG (last 3)   Recent Labs  02/23/17 0759 02/23/17 1137 02/23/17 1626  GLUCAP 98 139* 104*    Wt Readings from Last 3 Encounters:  02/25/17 79 kg (174 lb 2.6 oz)  02/23/17 85.7 kg (189 lb)    Physical Exam:  Constitutional: He appears well-developed and well-nourished. He appears lethargic. He is easily aroused.  HENT:  Head: Normocephalic and atraumatic.  Eyes: PERRL.  Neck: Normal range of motion. Neck supple.  Cardiovascular: RRR.    Marland Kitchen Respiratory: cTA/ no wheezes or rales.  GI: Soft. Bowel sounds are normal. He exhibits no distension. There is no tenderness. There is no rebound.  Genitourinary:  Genitourinary-garment Musculoskeletal: He exhibits no edema or tenderness.  Neurological:  He appears more alert. Makes eye contact and follows simple commands. Assisted with transfer. Left central 7 and decreased oral-motor control Left facial weakness with moderate  to severe dysarthria persists.  Delayed reaction. Oriented to self.  LUE 2+ to 3-/5. LLE 2-3-/5.  Decreased sense of pain left arm/leg   Skin: Skin is warm and dry.  Psychiatric: alert but flat   Assessment/Plan: 1. Functional and cognitive deficits secondary to right thalamic hemorrhage which require 3+ hours per day of interdisciplinary therapy in a comprehensive inpatient rehab setting. Physiatrist is providing close team supervision and 24 hour management of active medical problems listed below. Physiatrist and rehab team continue to assess barriers to discharge/monitor patient progress toward functional and medical goals.  Function:  Bathing Bathing position Bathing activity did not occur: Safety/medical concerns (pt too lethargic to participate)    Bathing parts      Bathing assist        Upper Body Dressing/Undressing Upper body dressing   What is the patient wearing?: Hospital gown                Upper body assist        Lower Body Dressing/Undressing Lower body dressing   What is the patient wearing?: Hospital Gown, Non-skid slipper socks                              Lower body assist        Toileting Toileting     Toileting steps completed by helper: Adjust clothing after toileting, Performs perineal hygiene, Adjust clothing prior to toileting    Toileting assist Assist level: Two helpers  Transfers Chair/bed transfer   Chair/bed transfer method: Other Chair/bed transfer assist level: dependent (Pt equals 0%) Chair/bed transfer assistive device: Mechanical lift Mechanical lift: Maximove   Locomotion Ambulation Ambulation activity did not occur: Safety/medical Editor, commissioning activity did not occur: Safety/medical concerns        Cognition Comprehension Comprehension assist level: Understands basic less than 25% of the time/ requires cueing >75% of the time (lethargic)  Expression Expression assist level:  Expresses basic 25 - 49% of the time/requires cueing 50 - 75% of the time. Uses single words/gestures. (lethargic)  Social Interaction Social Interaction assist level:  (Pt with pronounced lethargy)  Problem Solving Problem solving assist level:  (Baseline dementia but with extreme lethargy this session)  Memory Memory assist level:  (Baseline dementia)   Medical Problem List and Plan: 1.  Functional deficits secondary to right thalamic hemorrhage             -begin CIR therapies  -spoke with family today 2.  DVT Prophylaxis/Anticoagulation: Pharmaceutical: Lovenox 3. Pain Management: Tylenol prn.  4. Mood: LCSW to follow for evaluation and support when appropriate.  5. Neuropsych: This patient is not capable of making decisions on jisown behalf.  -much more alert today. Slept last night  -limit neurosedating meds  -continue sleep chart 6. Skin/Wound Care: routine pressure relief measures. Maintain adequate nutritional and hydration status.  7. Fluids/Electrolytes/Nutrition:   Encourage nectar thick liquids intake.   -follow labs serially 8. Dementia: Monitor sleep wake cycle. Maintain consistent schedule.  9. HTN:  Remains labile --will monitor BP qid. On Norvasc daily with Prinivil bid.  10. Pre- renal azotemia:     may need PEG for adequate nutrition and hydration per reports.    11. Urinary retention: Foley placed on 3/14. Voiding trial as lethargy resolves.   -ua suspicious--ucx pending  -given leukocytosis, will go ahead and start empiric keflex 12. Anemia:  Hgb 12.9.  13. Dysphagia: On dysphagia 1, nectar liquids with aspiration precautions.  14. Hyperglycemia: Hgb A1C- 5.5. Likely due to thickened juices for hydration.  15. Mild CHF due to Fluid overload: Monitor weights daily. Low salt diet.   -weight 79kg today    LOS (Days) 2 A FACE TO FACE EVALUATION WAS PERFORMED  Meredith Staggers, MD 02/25/2017 9:05 AM

## 2017-02-25 NOTE — Progress Notes (Signed)
Physical Therapy Session Note  Patient Details  Name: Anthony Fisher MRN: [id number] Date of Birth: 25-Dec-1925  Today's Date: 02/25/2017 PT Individual Time: 0900-0957 PT Individual Time Calculation (min): 57 min   Short Term Goals: Week 1:  PT Short Term Goal 1 (Week 1): Pt will complete bed mobility with max assist. PT Short Term Goal 2 (Week 1): Pt will complete bed<>chair transfers with max assist.  Skilled Therapeutic Interventions/Progress Updates:    Pt received supine in bed and agreeable to PT. Supine>sit transfer with max -total assist from PT and max cues for arousal, sequencing, and increase use of trunk musculature.  Sitting balance EOB x 5 minutes with max assist to maintain sitting upright posture. Patient noted to have constant L lean, and able to correct with max verbal and tactile cues only 10% of the time.  Sqaut pivot transfer to the R with max-total assist from PT with max cues for  Sequencing, positoining, posture, and initiation. Pt transported to rehab gym in [place].  PT adjusted back support to provided adequate lumbar support and improve postioning of occipital support and provided pt with Ulice Dash fusion pressure relieving seat cushion to improve sitting tolerance and reduce risk of pressure ulcers due to inability to preform pressure relief. '   sit<>stand st stedy x 2 with max assist from PT and max cues for positioning and proper use of BUE. Max assist to maintain semi-standing position in stedy due to constant L lateral lean.   Patient returned to room and left sitting in [facility] with call bell in reach and all needs met.    Pt was able to maintian arousal throughout this treatment session, but only oriented to person at this time.        Therapy Documentation Precautions:  Precautions Precautions: Fall Restrictions Weight Bearing Restrictions: No   Pain:faces: none  See Function Navigator for Current Functional Status.   Therapy/Group: Individual  Therapy  Lorie Phenix 02/25/2017, 10:40 AM

## 2017-02-25 NOTE — Progress Notes (Signed)
Occupational Therapy Session Note  Patient Details  Name: SAMIT SYLVE MRN: [id number] Date of Birth: 12/29/1925  Today's Date: 02/25/2017 OT Individual Time: 1950-9326 OT Individual Time Calculation (min): 46 min    Short Term Goals: Week 1:  OT Short Term Goal 1 (Week 1): Pt will be able to sit at EOB with mod A to engage in self care. OT Short Term Goal 2 (Week 1): Pt will be able to squat pivot to  BSC with max A of 1. OT Short Term Goal 3 (Week 1): Pt will bathe UB with min A. OT Short Term Goal 4 (Week 1): Pt will don shirt with mod A.  Skilled Therapeutic Interventions/Progress Updates:    Pt much more alert at start of session today.  Yesterdays evaluation was very limited as pt was too lethargic to participate. Pt said hello and that he did not have pain and was able to repeat word immediately after therapist.  He could not state he was in the hospital or why.  He did understand it was time for therapy and agreed to participate.  Pt did help with rolling toward his R side to use R arm to push into sitting with max A. Once at EOB he was able to maintain static sit for 2 min with close S. He was also able to push into stand with max A.  Pt stated he needed to toilet. His daughter was present and offered to be the 2nd person A with transfer. Pt completed a stand pivot with max A and sat to [facility].  On BSC held balance for 3-4 min, physician arrived and examined pt.  As soon as he left pt began leaning heavily to his L and using R arm to push away from R arm rest.  Total A to prevent pt from leaning too far during UB bathing (mod A) and UB dressing (max A).  He was not attending to directions well and continuing to lean heavily.  Pt became very fatigued quickly.  Used bariatric stedy to have pt stand and to transfer him back to bed. Total A +2 with stedy as pt continuing to lean.  Pt not able to correct with his daughter's cues either.  Once in bed, pants donned for pt.  He started out with  strong participation and demonstrated some trunk control and LE strength to push into stand.  Fatigue set in very quickly.  For next session, should assess blood pressure when first sitting up.   Today pt stated it was 1976, did not know the month even with cues, or where he was.  His eyes are in alignment and do shift to the L in full ROM to scan the room.  Pt resting in bed with his daughter in the room.  Therapy Documentation Precautions:  Precautions Precautions: Fall Restrictions Weight Bearing Restrictions: No    Vital Signs: Therapy Vitals Temp: 98.9 F (37.2 C) Temp Source: Oral Pulse Rate: 79 Resp: 18 BP: 136/78 Patient Position (if appropriate): Lying Oxygen Therapy SpO2: 97 % O2 Device: Not Delivered Pain: no c/o pain   ADL: ADL ADL Comments: refer to functional navigator  See Function Navigator for Current Functional Status.   Therapy/Group: Individual Therapy  [place] 02/25/2017, 7:56 AM

## 2017-02-26 ENCOUNTER — Inpatient Hospital Stay (HOSPITAL_COMMUNITY): Payer: Self-pay

## 2017-02-26 ENCOUNTER — Inpatient Hospital Stay (HOSPITAL_COMMUNITY): Payer: Medicare Other | Admitting: Speech Pathology

## 2017-02-26 ENCOUNTER — Inpatient Hospital Stay (HOSPITAL_COMMUNITY): Payer: Medicare Other

## 2017-02-26 LAB — BASIC METABOLIC PANEL
Anion gap: 9 (ref 5–15)
BUN: 31 mg/dL — AB (ref 6–20)
CO2: 25 mmol/L (ref 22–32)
CREATININE: 0.9 mg/dL (ref 0.61–1.24)
Calcium: 8.9 mg/dL (ref 8.9–10.3)
Chloride: 106 mmol/L (ref 101–111)
GFR calc Af Amer: >60 mL/min (ref >60)
GFR calc non Af Amer: >60 mL/min (ref >60)
GLUCOSE: 127 mg/dL — AB (ref 65–99)
Potassium: 3.7 mmol/L (ref 3.5–5.1)
Sodium: 140 mmol/L (ref 135–145)

## 2017-02-26 LAB — CBC
HCT: 35.2 % — ABNORMAL LOW (ref 39.0–52.0)
HEMOGLOBIN: 11.7 g/dL — AB (ref 13.0–17.0)
MCH: 32.9 pg (ref 26.0–34.0)
MCHC: 33.2 g/dL (ref 30.0–36.0)
MCV: 98.9 fL (ref 78.0–100.0)
PLATELETS: 266 10*3/uL (ref 150–400)
RBC: 3.56 MIL/uL — AB (ref 4.22–5.81)
RDW: 13.4 % (ref 11.5–15.5)
WBC: 10.5 10*3/uL (ref 4.0–10.5)

## 2017-02-26 MED ORDER — SODIUM CHLORIDE 0.45 % IV SOLN
INTRAVENOUS | Status: DC
  Administered 2017-02-26: 50 mL/h via INTRAVENOUS
  Administered 2017-02-27: 18:00:00 via INTRAVENOUS

## 2017-02-26 NOTE — Progress Notes (Signed)
Speech Language Pathology Daily Session Note  Patient Details  Name: Anthony Fisher MRN: [id number] Date of Birth: [birth year]  Today's Date: 02/26/2017 SLP Individual Time: 0830-0930 SLP Individual Time Calculation (min): 60 min  Short Term Goals: Week 1: SLP Short Term Goal 1 (Week 1): Pt will demonstrate focused attention to familiar task for ~5 minutes with Min A verbal cues.  SLP Short Term Goal 2 (Week 1): Patient will utilize an increased vocal intensity to maximize speech intelligibility to ~90% at the phrase level with Min A verbal cues.  SLP Short Term Goal 3 (Week 1): Patient will consume current diet with minimal overt s/s of aspiration with Max A verbal cues for use of swallowing compenstory strategies.  SLP Short Term Goal 4 (Week 1): Patient will consume trials of ice chips with overt s/s of aspiration in 25% of trials or less over 2 sessions with Mod A verbal cues to assess readiness for repeat MBS.  SLP Short Term Goal 5 (Week 1): Patient will demonstrate management of secretions and will initiate swallowing his saliva in 50% of opportunities with Mod A verbal cues.   Skilled Therapeutic Interventions: Skilled treatment session focused on dysphagia and cognitive goals. SLP facilitated session by providing Mod-Max A verbal cues for sustained attention for ~2 minute intervals and initiation with self-feeding. Patient consumed his breakfast meal of Dys. 1 textures with nectar-thick liquids with overt cough X 1 at end of meal, suspect due to bite size. Patient required Max A verbal cues to self-monitor and correct oral residue with liquid washes and tongue sweep and total A to self-monitor and correct left anterior spillage. Patient also required Mod A verbal cues for use of small bites and a slow rate. Oral care performed at end of session. Patient's daughter present throughout session and provided appropriate cueing. Therefore, she is signed off to provide supervision with meals.  Patient left upright in bed with daughter present. Continue with current plan of care.      Function:  Eating Eating   Modified Consistency Diet: Yes Eating Assist Level: More than reasonable amount of time;Supervision or verbal cues;Helper checks for pocketed food;Helper scoops food on utensil     Helper [place] on Utensil: Occasionally     Cognition Comprehension Comprehension assist level: Understands basic 50 - 74% of the time/ requires cueing 25 - 49% of the time  Expression   Expression assist level: Expresses basis less than 25% of the time/requires cueing >75% of the time.  Social Interaction Social Interaction assist level: Interacts appropriately 25 - 49% of time - Needs frequent redirection.  Problem Solving Problem solving assist level: Solves basic less than 25% of the time - needs direction nearly all the time or does not effectively solve problems and may need a restraint for safety  Memory Memory assist level: Recognizes or recalls less than 25% of the time/requires cueing greater than 75% of the time    Pain No/Denies Pain   Therapy/Group: Individual Therapy  Jeanenne Licea 02/26/2017, 4:26 PM

## 2017-02-26 NOTE — IPOC Note (Signed)
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 ([place])     Functional Problem List: Nursing Bladder, Endurance, Motor, Perception, Safety, Skin Integrity  PT Balance, Behavior, Endurance, Motor, Pain, Perception, Safety  OT Balance, Cognition, Motor, Endurance, Perception, Sensory, Vision, Safety  SLP    TR         Basic ADL's: OT Eating, Grooming, Bathing, Dressing, Toileting     Advanced  ADL's: OT       Transfers: PT Bed Mobility, Bed to Chair, Car, Manufacturing systems engineer, Metallurgist: PT Ambulation, Emergency planning/management officer, Stairs     Additional Impairments: OT Fuctional Use of Upper Extremity  SLP        TR      Anticipated Outcomes Item Anticipated Outcome  Self Feeding supervision  Swallowing  Min A with least restrictive diet    Basic self-care  min A  Toileting  min A   Bathroom Transfers min A  Bowel/Bladder  bowel and badder retraining  Transfers  mod assist overall  Locomotion  mod assist overall  Communication  Min A    Cognition  Min A  Pain  pt remain pain free  Safety/Judgment  pt will become more aware of safety parameters   Therapy Plan: PT Intensity: Minimum of 1-2 x/day ,45 to 90 minutes PT Frequency: 5 out of 7 days PT Duration Estimated Length of Stay: 3-4 weeks OT Intensity: Minimum of 1-2 x/day, 45 to 90 minutes OT Frequency: 5 out of 7 days OT Duration/Estimated Length of Stay: 26-28 days SLP Intensity: Minumum of 1-2 x/day, 30 to 90 minutes SLP Frequency: 3 to 5 out of 7 days SLP Duration/Estimated Length of Stay: 26 to 28 days       Team Interventions: Nursing Interventions Patient/Family Education, Bladder Management, Skin  Care/Wound Management, Dysphagia/Aspiration Precaution Training, Psychosocial Support, Medication Management, Disease Management/Prevention  PT interventions Ambulation/gait training, DME/adaptive equipment instruction, Community reintegration, Neuromuscular re-education, Psychosocial support, Stair training, UE/LE Strength taining/ROM, Wheelchair propulsion/positioning, UE/LE Coordination activities, Therapeutic Activities, Skin care/wound management, Pain management, Functional electrical stimulation, Training and development officer, Discharge planning, Cognitive remediation/compensation, Disease management/prevention, Functional mobility training, Patient/family education, Splinting/orthotics, Therapeutic Exercise, Visual/perceptual remediation/compensation  OT Interventions Balance/vestibular training, Cognitive remediation/compensation, Discharge planning, DME/adaptive equipment instruction, Functional electrical stimulation, Functional mobility training, Neuromuscular re-education, Patient/family education, Self Care/advanced ADL retraining, Psychosocial support, Therapeutic Activities, Therapeutic Exercise, UE/LE Strength taining/ROM, UE/LE Coordination activities, Visual/perceptual remediation/compensation  SLP Interventions Therapeutic Activities, Functional tasks, Dysphagia/aspiration precaution training, Cueing hierarchy, Speech/Language facilitation, Patient/family education  TR Interventions    SW/CM Interventions Discharge Planning, Psychosocial Support, Patient/Family Education    Team Discharge Planning: Destination: PT-Home ,OT- Home , SLP-Home Projected Follow-up: PT-Home health PT, 24 hour supervision/assistance, OT-  Home health OT, SLP-Home Health SLP, 24 hour supervision/assistance Projected Equipment Needs: PT-To be determined, OT- Tub/shower bench, 3 in 1 bedside comode, SLP-To be determined Equipment Details: PT- , OT-  Patient/family involved in discharge planning: PT- Family  member/caregiver,  OT-Family member/caregiver, SLP-Family member/caregiver  MD ELOS: 24-28 days Medical Rehab Prognosis:  Excellent Assessment: The patient has been admitted for CIR therapies with the diagnosis of right thalamic hemorrhage with left hemiparesis. The team will be addressing functional mobility, strength, stamina, balance, safety, adaptive techniques and equipment,  self-care, bowel and bladder mgt, patient and caregiver education, NMR, cognitive perceptual rx, swallowing, communication, pain mgt. Goals have been set at min to mod assist for basic transfers, mobility, cognition, swallowing and communication.    Meredith Staggers, MD, FAAPMR      See Team Conference Notes for weekly updates to the plan of care

## 2017-02-26 NOTE — Progress Notes (Signed)
Occupational Therapy Session Note  Patient Details  Name: Anthony Fisher MRN: [id number] Date of Birth: [birth year]  Today's Date: 02/26/2017 OT Individual Time: 1000-1100 OT Individual Time Calculation (min): 60 min    Short Term Goals: Week 1:  OT Short Term Goal 1 (Week 1): Pt will be able to sit at EOB with mod A to engage in self care. OT Short Term Goal 2 (Week 1): Pt will be able to squat pivot to  BSC with max A of 1. OT Short Term Goal 3 (Week 1): Pt will bathe UB with min A. OT Short Term Goal 4 (Week 1): Pt will don shirt with mod A.  Skilled Therapeutic Interventions/Progress Updates:    Pt resting in bed upon arrival with daughter present.  Pt engaged in BADL retraining including bathing/dressing with sit<>stand from EOB.  Focus on sitting balance, standing balance, active participation, task initiation, sequencing, and safety awareness to increase independence with BADLs.  Pt required max verbal cues for sitting balance and exhibited mod to max lean to his L, especially with increased fatigue.  Pt also exhibited posterior lean while sitting but able to self correct and required constant verbal cues to correct.  Pt required max multimodal cues to initiate bathing and dressing tasks and for sequencing.  Pt performed sit<>stand from EOB with max A and +2 assistance to pull up pants.  Pt required mod verbal cues to keep his eyes open during session.  Pt remained in bed with all needs within reach and daughter present.    Therapy Documentation Precautions:  Precautions Precautions: Fall Restrictions Weight Bearing Restrictions: No Pain:  Pt denied pain  ADL: ADL ADL Comments: refer to functional navigator  See Function Navigator for Current Functional Status.   Therapy/Group: Individual Therapy  Leroy Libman 02/26/2017, 12:05 PM

## 2017-02-26 NOTE — Progress Notes (Signed)
Removed foley catheter  at 11:09. 350cc of amber colored fluid obtained.

## 2017-02-26 NOTE — Progress Notes (Signed)
Physical Therapy Session Note  Patient Details  Name: Anthony Fisher MRN: [id number] Date of Birth: Aug 31, 1926  Today's Date: 02/26/2017 PT Individual Time: 6433-2951 PT Individual Time Calculation (min): 45 min   Short Term Goals: Week 1:  PT Short Term Goal 1 (Week 1): Pt will complete bed mobility with max assist. PT Short Term Goal 2 (Week 1): Pt will complete bed<>chair transfers with max assist.  Skilled Therapeutic Interventions/Progress Updates:    Session focused on neuro re-ed in standing frame (with +2 assist for facilitation of weightshift and upright posture, functional reaching tasks with horseshoes, and writing on mirror and use of mirror for visual feedback) to address reorientation to midline, postural control re-training, increasing weightbearing and attention to the R, upright tolerance and decreasing pushing to the L. Required total assist for LUE management during transitional sit <> stand in standing frame. Max assist for weightshifts to don standing frame sling seated in w/c and max cues for attention and sequencing during this task. Pt able to demonstrate activation of musculature for re-orientation to midline and postural control in standing but unable to sustain for any period of time. Pt able to tolerate 25 min in standing frame. Daughter present during session to observe. Handoff to OT.  Therapy Documentation Precautions:  Precautions Precautions: Fall Precaution Comments: L hemi; pushes L; R inattention Restrictions Weight Bearing Restrictions: No  Pain:  Denies pain.   See Function Navigator for Current Functional Status.   Therapy/Group: Individual Therapy  Canary Brim Ivory Broad, PT, DPT  02/26/2017, 2:35 PM

## 2017-02-26 NOTE — Progress Notes (Signed)
Occupational Therapy Note  Patient Details  Name: GORMAN SAFI MRN: [id number] Date of Birth: Aug 13, 1926  Today's Date: 02/26/2017 OT Individual Time: 1345-1430 OT Individual Time Calculation (min): 45 min   Pt denied pain Individual therapy  Pt resting in TIS w/c upon arrival.  Pt stated he needed to urinate.  Pt stood from w/c with max A while NT managed urinal.  Pt unable to void and sat back into w/c.  Pt stated he needed to have a BM and performed stand pivot transfer to [facility] with tot A +2 (pt=25%).  Pt incontinent of bowel and performed sit<>stand for hygiene, stand pivot to w/c, stand pivot to bed, and engaged in bed mobility to facilitate hygiene.  Pt required max A for rolling side to side for cleaning.  Pt was able to bridge while supine in bed to facilitate placing of pad.  Pt followed one step commands throughout session.  Pt required max verbal cues for weight shifts and standing balance.     Leotis Shames [facility] 02/26/2017, 2:50 PM

## 2017-02-26 NOTE — Progress Notes (Signed)
Recreational Therapy Session Note  Patient Details  Name: Anthony Fisher MRN: [id number] Date of Birth: [birth year] Today's Date: 02/26/2017  Pt placed on HOLD for TR services.  Will continue to monitor through team for future TR participation.  Joal Eakle 02/26/2017, 4:00 PM

## 2017-02-26 NOTE — Progress Notes (Signed)
[facility] PHYSICAL MEDICINE & REHABILITATION     PROGRESS NOTE    Subjective/Complaints: Slept much better last night. Up with OT and participating this am  ROS: Limited due cognitive/behavioral   Objective: Vital Signs: Blood pressure (!) 186/76, pulse 78, temperature 98.6 F (37 C), temperature source Oral, resp. rate 19, weight 79.6 kg (175 lb 7.8 oz), SpO2 98 %. No results found.  Recent Labs  02/24/17 1010 02/26/17 0607  WBC 13.7* 10.5  HGB 12.9* 11.7*  HCT 38.7* 35.2*  PLT 288 266    Recent Labs  02/24/17 1010 02/26/17 0607  NA 139 140  K 3.4* 3.7  CL 104 106  GLUCOSE 156* 127*  BUN 24* 31*  CREATININE 0.90 0.90  CALCIUM 9.4 8.9   CBG (last 3)   Recent Labs  02/23/17 1137 02/23/17 1626  GLUCAP 139* 104*    Wt Readings from Last 3 Encounters:  02/26/17 79.6 kg (175 lb 7.8 oz)  02/23/17 85.7 kg (189 lb)    Physical Exam:  Constitutional: He appears well-developed and well-nourished. He appears lethargic. He is easily aroused.  HENT:  Head: Normocephalic and atraumatic.  Eyes: PERRL.  Neck: Normal range of motion. Neck supple.  Cardiovascular: RRR.    Marland Kitchen Respiratory: cTA/ no wheezes or rales.  GI: Soft. Bowel sounds are normal. He exhibits no distension. There is no tenderness. There is no rebound.  Genitourinary:  Genitourinary-garment Musculoskeletal: He exhibits no edema or tenderness.  Neurological:  He appears more alert. Makes eye contact and follows simple commands. Assisted with transfer. Left central 7 and decreased oral-motor control Left facial weakness with moderate to severe dysarthria persists.  Delayed reaction. Oriented to self.  LUE 2+ to 3-/5. LLE 2-3-/5.  Decreased sense of pain left arm/leg   Skin: Skin is warm and dry.  Psychiatric: alert but flat   Assessment/Plan: 1. Functional and cognitive deficits secondary to right thalamic hemorrhage which require 3+ hours per day of interdisciplinary therapy in a comprehensive  inpatient rehab setting. Physiatrist is providing close team supervision and 24 hour management of active medical problems listed below. Physiatrist and rehab team continue to assess barriers to discharge/monitor patient progress toward functional and medical goals.  Function:  Bathing Bathing position Bathing activity did not occur: Safety/medical concerns (pt too lethargic to participate) Position: Other (comment) (sitting on BSC)  Bathing parts Body parts bathed by patient: Chest, Abdomen, Left arm Body parts bathed by helper: Right arm, Front perineal area, Buttocks, Back  Bathing assist Assist Level: 2 helpers      Upper Body Dressing/Undressing Upper body dressing   What is the patient wearing?: Pull over shirt/dress     Pull over shirt/dress - Perfomed by patient: Thread/unthread right sleeve Pull over shirt/dress - Perfomed by helper: Thread/unthread left sleeve, Put head through opening, Pull shirt over trunk        Upper body assist Assist Level: 2 helpers      Lower Body Dressing/Undressing Lower body dressing   What is the patient wearing?: Pants, Non-skid slipper socks       Pants- Performed by helper: Thread/unthread right pants leg, Thread/unthread left pants leg, Pull pants up/down   Non-skid slipper socks- Performed by helper: Don/doff right sock, Don/doff left sock                  Lower body assist        Toileting Toileting Toileting activity did not occur: Safety/medical concerns (bedpan or blue pad)  Toileting steps completed by helper: Adjust clothing after toileting, Performs perineal hygiene, Adjust clothing prior to toileting    Toileting assist Assist level: Two helpers   Transfers Chair/bed transfer   Chair/bed transfer method: Squat pivot Chair/bed transfer assist level: Maximal assist (Pt 25 - 49%/lift and lower) Chair/bed transfer assistive device: Mechanical lift Mechanical lift: Maximove   Locomotion Ambulation Ambulation  activity did not occur: Safety/medical Editor, commissioning activity did not occur: Safety/medical concerns        Cognition Comprehension Comprehension assist level: Understands basic 25 - 49% of the time/ requires cueing 50 - 75% of the time  Expression Expression assist level: Expresses basis less than 25% of the time/requires cueing >75% of the time.  Social Interaction Social Interaction assist level: Interacts appropriately 25 - 49% of time - Needs frequent redirection.  Problem Solving Problem solving assist level: Solves basic less than 25% of the time - needs direction nearly all the time or does not effectively solve problems and may need a restraint for safety  Memory Memory assist level: Recognizes or recalls less than 25% of the time/requires cueing greater than 75% of the time   Medical Problem List and Plan: 1.  Functional deficits secondary to right thalamic hemorrhage             -continue CIR therapies  -patient displaying increased engagement 2.  DVT Prophylaxis/Anticoagulation: Pharmaceutical: Lovenox 3. Pain Management: Tylenol prn.  4. Mood: LCSW to follow for evaluation and support when appropriate.  5. Neuropsych: This patient is not capable of making decisions on jisown behalf.  -sleep improving  -limit neurosedating meds as possible  -rx UTI  -continue sleep chart 6. Skin/Wound Care: routine pressure relief measures. Maintain adequate nutritional and hydration status.  7. Fluids/Electrolytes/Nutrition:   Encourage nectar thick liquids intake.   -begin HS IVF (gentle fluids)  -follow up labs next week 8. Dementia: Monitor sleep wake cycle. Maintain consistent schedule.  9. HTN:  Remains somewhat labile -- On Norvasc daily with Prinivil bid--titrate as needed 10. Pre- renal azotemia:      -add HS IV fluid. Not able to take in enough nectar fluids at this point  .    11. Urinary retention: Foley placed on 3/14.    -proteus UTI---empiric  keflex started yesterday  -dc foley and begin voiding trial today 12. Anemia:  Hgb 12.9.  13. Dysphagia: On dysphagia 1, nectar liquids with aspiration precautions.  14. Hyperglycemia: Hgb A1C- 5.5. Likely due to thickened juices for hydration.  15. Mild CHF due to Fluid overload: Monitor weights daily especially with IVF  -. Low salt diet.   -weight 79kg today    LOS (Days) 3 A FACE TO FACE EVALUATION WAS PERFORMED  Alger Simons T, MD 02/26/2017 10:01 AM

## 2017-02-27 ENCOUNTER — Inpatient Hospital Stay (HOSPITAL_COMMUNITY): Payer: Self-pay | Admitting: Physical Therapy

## 2017-02-27 ENCOUNTER — Inpatient Hospital Stay (HOSPITAL_COMMUNITY): Payer: Medicare Other | Admitting: Speech Pathology

## 2017-02-27 ENCOUNTER — Inpatient Hospital Stay (HOSPITAL_COMMUNITY): Payer: Self-pay

## 2017-02-27 LAB — BASIC METABOLIC PANEL
Anion gap: 11 (ref 5–15)
BUN: 27 mg/dL — AB (ref 6–20)
CALCIUM: 8.9 mg/dL (ref 8.9–10.3)
CO2: 24 mmol/L (ref 22–32)
CREATININE: 0.78 mg/dL (ref 0.61–1.24)
Chloride: 105 mmol/L (ref 101–111)
GFR calc non Af Amer: >60 mL/min (ref >60)
GLUCOSE: 128 mg/dL — AB (ref 65–99)
Potassium: 3.5 mmol/L (ref 3.5–5.1)
Sodium: 140 mmol/L (ref 135–145)

## 2017-02-27 MED ORDER — AMOXICILLIN 250 MG PO CAPS
250.0000 mg | ORAL_CAPSULE | Freq: Three times a day (TID) | ORAL | Status: DC
  Administered 2017-02-27 – 2017-03-09 (×28): 250 mg via ORAL
  Filled 2017-02-27 (×29): qty 1

## 2017-02-27 NOTE — Progress Notes (Addendum)
[place] PHYSICAL MEDICINE & REHABILITATION     PROGRESS NOTE    Subjective/Complaints: Slept quite well last night. Had busy day yesterday  ROS: Limited due cognitive/behavioral    Objective: Vital Signs: Blood pressure (!) 150/68, pulse 78, temperature 98.7 F (37.1 C), temperature source Oral, resp. rate 16, weight 80 kg (176 lb 5.9 oz), SpO2 98 %. No results found.  Recent Labs  02/24/17 1010 02/26/17 0607  WBC 13.7* 10.5  HGB 12.9* 11.7*  HCT 38.7* 35.2*  PLT 288 266    Recent Labs  02/26/17 0607 02/27/17 0515  NA 140 140  K 3.7 3.5  CL 106 105  GLUCOSE 127* 128*  BUN 31* 27*  CREATININE 0.90 0.78  CALCIUM 8.9 8.9   CBG (last 3)  No results for input(s): GLUCAP in the last 72 hours.  Wt Readings from Last 3 Encounters:  02/27/17 80 kg (176 lb 5.9 oz)  02/23/17 85.7 kg (189 lb)    Physical Exam:  Constitutional: He appears well-developed and well-nourished. He appears lethargic. He is easily aroused.  HENT:  Head: Normocephalic and atraumatic.  Eyes: PERRL.  Neck: Normal range of motion. Neck supple.  Cardiovascular: RRR    . Respiratory: CTA. No rales. No distress.  GI: Soft. Bowel sounds are normal. He exhibits no distension. There is no tenderness. There is no rebound.  Genitourinary:  Genitourinary-garment Musculoskeletal: He exhibits no edema or tenderness.  Neurological:  Still waking up when I arrived.  Left central 7 and decreased oral-motor control Left facial weakness with moderate to severe dysarthria persists.  Delayed reaction. Oriented to self.  LUE 2+ to 3-/5. LLE 2-3-/5.  Decreased sense of pain left arm/leg   Skin: Skin is warm and dry.  Psychiatric:  flat   Assessment/Plan: 1. Functional and cognitive deficits secondary to right thalamic hemorrhage which require 3+ hours per day of interdisciplinary therapy in a comprehensive inpatient rehab setting. Physiatrist is providing close team supervision and 24 hour management of  active medical problems listed below. Physiatrist and rehab team continue to assess barriers to discharge/monitor patient progress toward functional and medical goals.  Function:  Bathing Bathing position Bathing activity did not occur: Safety/medical concerns (pt too lethargic to participate) Position: Sitting EOB  Bathing parts Body parts bathed by patient: Chest, Left arm, Abdomen, Right upper leg, Left upper leg Body parts bathed by helper: Right arm, Front perineal area, Buttocks, Back  Bathing assist Assist Level: 2 helpers      Upper Body Dressing/Undressing Upper body dressing   What is the patient wearing?: Pull over shirt/dress     Pull over shirt/dress - Perfomed by patient: Thread/unthread right sleeve Pull over shirt/dress - Perfomed by helper: Thread/unthread left sleeve, Put head through opening, Pull shirt over trunk        Upper body assist Assist Level: 2 helpers      Lower Body Dressing/Undressing Lower body dressing   What is the patient wearing?: Pants, Non-skid slipper socks       Pants- Performed by helper: Thread/unthread right pants leg, Thread/unthread left pants leg, Pull pants up/down   Non-skid slipper socks- Performed by helper: Don/doff right sock, Don/doff left sock                  Lower body assist        Toileting Toileting Toileting activity did not occur: Safety/medical concerns (bedpan or blue pad)   Toileting steps completed by helper: Adjust clothing prior to toileting, Performs  perineal hygiene, Adjust clothing after toileting    Toileting assist Assist level: Two helpers   Transfers Chair/bed transfer   Chair/bed transfer method: Squat pivot Chair/bed transfer assist level: Maximal assist (Pt 25 - 49%/lift and lower) Chair/bed transfer assistive device: Mechanical lift Mechanical lift: Maximove   Locomotion Ambulation Ambulation activity did not occur: Safety/medical Editor, commissioning  activity did not occur: Safety/medical concerns     Assist Level: Dependent (Pt equals 0%) (TIS)  Cognition Comprehension Comprehension assist level: Understands basic 50 - 74% of the time/ requires cueing 25 - 49% of the time  Expression Expression assist level: Expresses basic 25 - 49% of the time/requires cueing 50 - 75% of the time. Uses single words/gestures.  Social Interaction Social Interaction assist level: Interacts appropriately 25 - 49% of time - Needs frequent redirection.  Problem Solving Problem solving assist level: Solves basic less than 25% of the time - needs direction nearly all the time or does not effectively solve problems and may need a restraint for safety  Memory Memory assist level: Recognizes or recalls less than 25% of the time/requires cueing greater than 75% of the time   Medical Problem List and Plan: 1.  Functional deficits secondary to right thalamic hemorrhage             -continue CIR therapies  -patient has displayed increased activity tolerance over the last few days 2.  DVT Prophylaxis/Anticoagulation: Pharmaceutical: Lovenox 3. Pain Management: Tylenol prn.  4. Mood: LCSW to follow for evaluation and support when appropriate.  5. Neuropsych: This patient is not capable of making decisions on jisown behalf.  -sleep improving  -limit neurosedating meds as possible  -rx UTI  -continue sleep chart 6. Skin/Wound Care: routine pressure relief measures. Maintain adequate nutritional and hydration status.  7. Fluids/Electrolytes/Nutrition:   Encourage nectar thick liquids intake.   -continue gentle HS IVF--BUN sl improved today. I personally reviewed the patient's labs today.  8. Dementia: Monitor sleep wake cycle. Maintain consistent schedule.  9. HTN:  Remains somewhat labile -- On Norvasc daily with Prinivil bid--titrate as needed 10. Pre- renal azotemia:      -added HS IV fluid. Not able to take in enough nectar fluids at this point  .    11. Urinary  retention:      -proteus UTI---empiric keflex for 7 days  -continue voiding trial, I/O cath prn 12. Anemia:  Hgb 12.9.  13. Dysphagia: On dysphagia 1, nectar liquids with aspiration precautions.  14. Hyperglycemia: Hgb A1C- 5.5. Likely due to thickened juices for hydration.  15. Mild CHF due to Fluid overload: Monitor weights daily especially with IVF  -. Low salt diet.   -weight 80kg today    LOS (Days) 4 A FACE TO FACE EVALUATION WAS PERFORMED  Meredith Staggers, MD 02/27/2017 8:57 AM

## 2017-02-27 NOTE — Progress Notes (Signed)
Physical Therapy Session Note  Patient Details  Name: Anthony Fisher MRN: [id number] Date of Birth: Jun 18, 1926  Today's Date: 02/27/2017 PT Individual Time: 0800-0857 PT Individual Time Calculation (min): 57 min    Skilled Therapeutic Interventions/Progress Updates:    Treatment today initiated with pt lying in bed with difficulty awakening pt for therapy.  Session focused on sustained attention to task as well as work on bilateral seated weight shifting in sitting.  Pt required total A to roll to side (b/l) for donning pants.  Performed seated reaching activities to work to decrease push over L LE during function.  Then performed sit to stand with cues (verbal and visual) to reach to right side in order to decrease push left.  Still required total assist (+2 for sit to stand).  Pt displayed improved orientation to upright posture following intervention.  Pt denied pain today.  Left sitting in chair tilted posterior with lap belt, call bell in reach, and family in room.  Therapy Documentation Precautions:  Precautions Precautions: Fall Precaution Comments: L hemi; pushes L; R inattention Restrictions Weight Bearing Restrictions: No   Therapy/Group: Individual Therapy  [name] 02/27/2017, 9:41 AM

## 2017-02-27 NOTE — Progress Notes (Signed)
Speech Language Pathology Daily Session Note  Patient Details  Name: Anthony Fisher MRN: [id number] Date of Birth: Jun 20, 1926  Today's Date: 02/27/2017 SLP Individual Time: 6384-5364 SLP Individual Time Calculation (min): 45 min  Short Term Goals: Week 1: SLP Short Term Goal 1 (Week 1): Pt will demonstrate focused attention to familiar task for ~5 minutes with Min A verbal cues.  SLP Short Term Goal 2 (Week 1): Patient will utilize an increased vocal intensity to maximize speech intelligibility to ~90% at the phrase level with Min A verbal cues.  SLP Short Term Goal 3 (Week 1): Patient will consume current diet with minimal overt s/s of aspiration with Max A verbal cues for use of swallowing compenstory strategies.  SLP Short Term Goal 4 (Week 1): Patient will consume trials of ice chips with overt s/s of aspiration in 25% of trials or less over 2 sessions with Mod A verbal cues to assess readiness for repeat MBS.  SLP Short Term Goal 5 (Week 1): Patient will demonstrate management of secretions and will initiate swallowing his saliva in 50% of opportunities with Mod A verbal cues.   Skilled Therapeutic Interventions: Skilled treatment session focused on dysphagia and cognition goals. SLP facilitated session by providing skilled observation of pt consuming dysphagia 1 with nectar thick liquids via straw breakfast tray. Pt required Max A multimodal cues to open eyes for focused/sustained attention even though pt was awake. Pt required Max to Mod cues for clear residue from left buccal space, frequent liquid wash for overall clearing and munch chew observed, no rotary chewing. Pt with no coughing, wet voice or throat clears during consumption. Pt was left upright in tilt-in-space wheelchair, safety belt donned and nursing present. Continue per current plan of care.      Function:  Eating Eating   Modified Consistency Diet: Yes Eating Assist Level: More than reasonable amount of time;Set up  assist for;Supervision or verbal cues;Helper checks for pocketed food;Helper scoops food on utensil   Eating Set Up Assist For: Opening containers Helper [place] on Utensil: Occasionally     Cognition Comprehension Comprehension assist level: Understands basic 50 - 74% of the time/ requires cueing 25 - 49% of the time  Expression   Expression assist level: Expresses basic 25 - 49% of the time/requires cueing 50 - 75% of the time. Uses single words/gestures.  Social Interaction Social Interaction assist level: Interacts appropriately 25 - 49% of time - Needs frequent redirection.  Problem Solving Problem solving assist level: Solves basic less than 25% of the time - needs direction nearly all the time or does not effectively solve problems and may need a restraint for safety  Memory Memory assist level: Recognizes or recalls less than 25% of the time/requires cueing greater than 75% of the time    Pain    Therapy/Group: Individual Therapy   Shamond Skelton B. Rutherford Nail, M.S., CCC-SLP Speech-Language Pathologist   Hines Kloss 02/27/2017, 9:41 AM

## 2017-02-27 NOTE — Progress Notes (Signed)
Occupational Therapy Session Note  Patient Details  Name: Anthony Fisher MRN: [id number] Date of Birth: [birth year]  Today's Date: 02/27/2017 OT Individual Time: 4680-3212 OT Individual Time Calculation (min): 45 min  and Today's Date: 02/27/2017 OT Missed Time: 75 Minutes Missed Time Reason: Patient fatigue   Short Term Goals: Week 1:  OT Short Term Goal 1 (Week 1): Pt will be able to sit at EOB with mod A to engage in self care. OT Short Term Goal 2 (Week 1): Pt will be able to squat pivot to  BSC with max A of 1. OT Short Term Goal 3 (Week 1): Pt will bathe UB with min A. OT Short Term Goal 4 (Week 1): Pt will don shirt with mod A.  Skilled Therapeutic Interventions/Progress Updates:    Pt resting in TIS w/c upon arrival with eyes closed.  Pt required max multimodal cues to open eyes and engage with therapist.  Pt noted with difficulty keeping eyes open throughout session and required max multimodal cues to initiate all tasks during bathing/dressing activities.  Pt required tot A + 2 for sit<>stand and standing balance with noted limited ability to shift weight to R.  Pt required tot A for UB dressing tasks and max A for UB bathing tasks with [facility] assist to initiate task.  Pt required tot A + 2 for LB bathing/dressing tasks.  Pt unable to keep his eyes open after bathing/dressing tasks and session ended secondary to ongoing lethargy/fatigue and inability to actively participate in skilled OT. Pt remained in TIS w/c with QRB in place and chair alarm activated.  Focus on activity tolerance, active participation, task initiation, sequencing, sit<>stand, standing balance, and safety awareness to increase independence with BADLs.   Therapy Documentation Precautions:  Precautions Precautions: Fall Precaution Comments: L hemi; pushes L; R inattention Restrictions Weight Bearing Restrictions: No General: General OT Amount of Missed Time: 45 Minutes  Pain:  Pt denied pain  See Function  Navigator for Current Functional Status.   Therapy/Group: Individual Therapy  Leroy Libman 02/27/2017, 11:54 AM

## 2017-02-28 ENCOUNTER — Inpatient Hospital Stay (HOSPITAL_COMMUNITY): Payer: Medicare Other | Admitting: Physical Therapy

## 2017-02-28 ENCOUNTER — Inpatient Hospital Stay (HOSPITAL_COMMUNITY): Payer: Self-pay | Admitting: Physical Therapy

## 2017-02-28 LAB — URINE CULTURE

## 2017-02-28 NOTE — Progress Notes (Signed)
Occupational Therapy Session Note  Patient Details  Name: Anthony Fisher MRN: [id number] Date of Birth: Feb 17, 1926  Today's Date: 02/28/2017 OT Individual Time: 0800-0905 OT Individual Time Calculation (min): 65 min    Short Term Goals: Week 1:  OT Short Term Goal 1 (Week 1): Pt will be able to sit at EOB with mod A to engage in self care. OT Short Term Goal 2 (Week 1): Pt will be able to squat pivot to  BSC with max A of 1. OT Short Term Goal 3 (Week 1): Pt will bathe UB with min A. OT Short Term Goal 4 (Week 1): Pt will don shirt with mod A.  Skilled Therapeutic Interventions/Progress Updates:    Pt completed bathing and dressing during session.  Increased lethargy and difficulty waking up to start session.  Therapist completed LB bathing and LB dressing in supine with rolling.  Total assist for pt to complete rolling, assisting only with bending of opposite knee to roll with therapist cueing and min assist.  He would occasionally respond to verbal questioning but difficult to understand.  Transitioned to sitting from sidelying with max assist.  Pt able to maintain static sitting balance for 1-2 mins with min assist once sitting, and he maintained his eyes open.  Max assist for squat pivot transfer to the right to move to tilt in space chair for UB bathing and dressing at the sink.  He was able to consistently follow one step commands when presented with washcloth to wash his face. When instructed to wash his arms, he would again perseverate and attempt to wash his face again, requiring mod demonstrational cueing for re-direction.  Max hand over hand assist to integrate the LUE into washing the right arm and for applying deodorant.  Therapist assist with shaving using electric shaver.  Max assist for donning pullover shirt following hemi techniques.  Pt needed mod assist as well for combing hair as he started it when presented with comb but did not complete it.  Pt left in wheelchair with safety  belt in place and call button in reach.    Therapy Documentation Precautions:  Precautions Precautions: Fall Precaution Comments: L hemi; pushes L Restrictions Weight Bearing Restrictions: No  Pain: Pain Assessment Pain Assessment: No/denies pain ADL:  See Function Navigator for Current Functional Status.   Therapy/Group: Individual Therapy  Rabon Scholle OTR/L 02/28/2017, 12:12 PM

## 2017-02-28 NOTE — Progress Notes (Signed)
Physical Therapy Session Note  Patient Details  Name: MANA MORISON MRN: [id number] Date of Birth: 09/18/1926  Today's Date: 02/28/2017 PT Individual Time: 1300-1330 PT Individual Time Calculation (min): 30 min   Short Term Goals: Week 1:  PT Short Term Goal 1 (Week 1): Pt will complete bed mobility with max assist. PT Short Term Goal 2 (Week 1): Pt will complete bed<>chair transfers with max assist.  Skilled Therapeutic Interventions/Progress Updates:  Pt received in bed with daughter [name]) & granddaughter present. [name] reports pt needs to use restroom. Pt transferred supine>sitting EOB with max assist and use of bed rails and HOB elevated. Pt requires max multimodal cuing to initiate and execute movement. Pt completed stand pivot bed<>BSC with +2 assist for safety. Pt with continent BM on toilet and while sitting on toilet required min/mod assist for sitting balance to correct L Lateral lean. Pt with poor ability to shift shoulders to midline despite multimodal cuing. Pt able to tolerate standing ~30-60 seconds with max assist to allow 2nd person to perform peri hygiene total assist and don brief. Pt transferred EOB>w/c in same manner as noted above. Transported pt to [employer] where he engaged in use of dyanvision with RUE. Task focused on attention to task; pt able to hit 1 red light with cuing but then requires further cuing to press next light. Pt with poor carryover of task and continues to require cuing. At end of session pt left in TIS w/c with QRB donned & with daughter present to supervise.   Therapy Documentation Precautions:  Precautions Precautions: Fall Precaution Comments: L hemi; pushes L Restrictions Weight Bearing Restrictions: No  Pain: Faces - no pain.   See Function Navigator for Current Functional Status.   Therapy/Group: Individual Therapy  [name] 02/28/2017, 5:33 PM

## 2017-02-28 NOTE — Progress Notes (Signed)
[place] PHYSICAL MEDICINE & REHABILITATION     PROGRESS NOTE    Subjective/Complaints: No new complaints. Feeling fairly well.   ROS: Limited due cognitive/behavioral    Objective: Vital Signs: Blood pressure (!) 150/62, pulse 79, temperature 97.8 F (36.6 C), temperature source Oral, resp. rate 16, weight 81.2 kg (179 lb 0.2 oz), SpO2 96 %. No results found.  Recent Labs  02/26/17 0607  WBC 10.5  HGB 11.7*  HCT 35.2*  PLT 266    Recent Labs  02/26/17 0607 02/27/17 0515  NA 140 140  K 3.7 3.5  CL 106 105  GLUCOSE 127* 128*  BUN 31* 27*  CREATININE 0.90 0.78  CALCIUM 8.9 8.9   CBG (last 3)  No results for input(s): GLUCAP in the last 72 hours.  Wt Readings from Last 3 Encounters:  02/28/17 81.2 kg (179 lb 0.2 oz)  02/23/17 85.7 kg (189 lb)    Physical Exam:  Constitutional: no distress.  HENT:  Head: Normocephalic and atraumatic.  Eyes: PERRL.  Neck: Normal range of motion. Neck supple.  Cardiovascular: RRR    . Respiratory: CTA. No rales. No distress.  GI: Soft. Bowel sounds are normal. He exhibits no distension. There is no tenderness. There is no rebound.  Genitourinary:  Genitourinary-garment Musculoskeletal: He exhibits no edema or tenderness.  Neurological:  Easily aroused  Left central 7 and decreased oral-motor control Left facial weakness with moderate to severe dysarthria persists.  Delayed reaction. Oriented to self.  LUE 2+ to 3-/5. LLE 2-3-/5.  Decreased sense of pain left arm/leg   Skin: Skin is warm and dry.  Psychiatric:  flat   Assessment/Plan: 1. Functional and cognitive deficits secondary to right thalamic hemorrhage which require 3+ hours per day of interdisciplinary therapy in a comprehensive inpatient rehab setting. Physiatrist is providing close team supervision and 24 hour management of active medical problems listed below. Physiatrist and rehab team continue to assess barriers to discharge/monitor patient progress toward  functional and medical goals.  Function:  Bathing Bathing position Bathing activity did not occur: Safety/medical concerns (pt too lethargic to participate) Position: Sitting EOB  Bathing parts Body parts bathed by patient: Chest, Left arm, Abdomen, Right upper leg, Left upper leg Body parts bathed by helper: Right arm, Front perineal area, Buttocks, Back  Bathing assist Assist Level: 2 helpers      Upper Body Dressing/Undressing Upper body dressing   What is the patient wearing?: Pull over shirt/dress     Pull over shirt/dress - Perfomed by patient: Thread/unthread right sleeve Pull over shirt/dress - Perfomed by helper: Thread/unthread left sleeve, Put head through opening, Pull shirt over trunk        Upper body assist Assist Level: 2 helpers      Lower Body Dressing/Undressing Lower body dressing   What is the patient wearing?: Pants, Non-skid slipper socks       Pants- Performed by helper: Thread/unthread right pants leg, Thread/unthread left pants leg, Pull pants up/down   Non-skid slipper socks- Performed by helper: Don/doff right sock, Don/doff left sock                  Lower body assist        Toileting Toileting Toileting activity did not occur: Safety/medical concerns (bedpan or blue pad)   Toileting steps completed by helper: Adjust clothing prior to toileting, Performs perineal hygiene, Adjust clothing after toileting    Toileting assist Assist level: Two helpers   Transfers Chair/bed transfer  Chair/bed transfer method: Other (Maxi move) Chair/bed transfer assist level: 2 helpers Chair/bed transfer assistive device: Mechanical lift (Maxi move) Mechanical lift: Maximove   Locomotion Ambulation Ambulation activity did not occur: Safety/medical Editor, commissioning activity did not occur: Safety/medical concerns     Assist Level: Dependent (Pt equals 0%) (TIS)  Cognition Comprehension Comprehension assist level:  Understands basic 50 - 74% of the time/ requires cueing 25 - 49% of the time  Expression Expression assist level: Expresses basic 50 - 74% of the time/requires cueing 25 - 49% of the time. Needs to repeat parts of sentences.  Social Interaction Social Interaction assist level: Interacts appropriately 50 - 74% of the time - May be physically or verbally inappropriate.  Problem Solving Problem solving assist level: Solves basic 50 - 74% of the time/requires cueing 25 - 49% of the time  Memory Memory assist level: Recognizes or recalls 90% of the time/requires cueing < 10% of the time   Medical Problem List and Plan: 1.  Functional deficits secondary to right thalamic hemorrhage             -continue CIR therapies  -patient has displayed increased activity tolerance over the last few days 2.  DVT Prophylaxis/Anticoagulation: Pharmaceutical: Lovenox 3. Pain Management: Tylenol prn.  4. Mood: LCSW to follow for evaluation and support when appropriate.  5. Neuropsych: This patient is not capable of making decisions on jisown behalf.  -sleep improving  -limit neurosedating meds as possible  -rx UTI  -continue sleep chart 6. Skin/Wound Care: routine pressure relief measures. Maintain adequate nutritional and hydration status.  7. Fluids/Electrolytes/Nutrition:   Encourage nectar thick liquids intake.   -eating and drinking better. Will hold IVF 8. Dementia: Monitor sleep wake cycle. Maintain consistent schedule.  9. HTN:  Remains somewhat labile -- On Norvasc daily with Prinivil bid--titrate as needed 10. Pre- renal azotemia:      -will hold IVF given increasd intake  .    11. Urinary retention:      -proteus and enterococcus UTI---changed to amoxil---continue for 7 days  -continue voiding trial, I/O cath prn 12. Anemia:  Hgb 12.9.  13. Dysphagia: On dysphagia 1, nectar liquids with aspiration precautions.  14. Hyperglycemia: Hgb A1C- 5.5. Likely due to thickened juices for hydration.  15.  Mild CHF due to Fluid overload: Monitor weights daily especially with IVF  -. Low salt diet.   -weight up to 81kg today    LOS (Days) 5 A FACE TO FACE EVALUATION WAS PERFORMED  Meredith Staggers, MD 02/28/2017 10:04 AM

## 2017-03-01 ENCOUNTER — Inpatient Hospital Stay (HOSPITAL_COMMUNITY): Payer: Medicare Other | Admitting: Physical Therapy

## 2017-03-01 ENCOUNTER — Inpatient Hospital Stay (HOSPITAL_COMMUNITY): Payer: Self-pay

## 2017-03-01 ENCOUNTER — Inpatient Hospital Stay (HOSPITAL_COMMUNITY): Payer: Self-pay | Admitting: Speech Pathology

## 2017-03-01 ENCOUNTER — Inpatient Hospital Stay (HOSPITAL_COMMUNITY): Payer: Self-pay | Admitting: Occupational Therapy

## 2017-03-01 LAB — BASIC METABOLIC PANEL
ANION GAP: 12 (ref 5–15)
BUN: 22 mg/dL — ABNORMAL HIGH (ref 6–20)
CALCIUM: 9.4 mg/dL (ref 8.9–10.3)
CO2: 22 mmol/L (ref 22–32)
Chloride: 104 mmol/L (ref 101–111)
Creatinine, Ser: 0.77 mg/dL (ref 0.61–1.24)
Glucose, Bld: 135 mg/dL — ABNORMAL HIGH (ref 65–99)
Potassium: 3.9 mmol/L (ref 3.5–5.1)
Sodium: 138 mmol/L (ref 135–145)

## 2017-03-01 MED ORDER — METHYLPHENIDATE HCL 5 MG PO TABS
2.5000 mg | ORAL_TABLET | Freq: Two times a day (BID) | ORAL | Status: DC
  Administered 2017-03-01 – 2017-03-03 (×5): 2.5 mg via ORAL
  Filled 2017-03-01 (×5): qty 1

## 2017-03-01 NOTE — Progress Notes (Signed)
Occupational Therapy Session Note  Patient Details  Name: Anthony Fisher MRN: [id number] Date of Birth: [birth year]  Today's Date: 03/01/2017 OT Individual Time: 0813-8871 OT Individual Time Calculation (min): 61 min    Short Term Goals: Week 1:  OT Short Term Goal 1 (Week 1): Pt will be able to sit at EOB with mod A to engage in self care. OT Short Term Goal 2 (Week 1): Pt will be able to squat pivot to  BSC with max A of 1. OT Short Term Goal 3 (Week 1): Pt will bathe UB with min A. OT Short Term Goal 4 (Week 1): Pt will don shirt with mod A.  Skilled Therapeutic Interventions/Progress Updates: Skilled OT session completed with focus on adaptive bathing/dressing skills, family education, left inattention, L UE NMR, and cognitive skills. Pt was lying in bed with family member [name]) assisting him with self feeding. Pt able to self feed when items were brought to midline or slightly to right side. OT facilitated L UE as stabilizer. Afterwards BADLs completed bedlevel in chair position. Pt self initiated washing left underarm and left upper leg. Max cues to maintain alertness due to pt often closing eyes and requiring tactile cues to regain focus on what he was doing. Pt able to hold wash cloth with L UE himself, required [facility] for washing right side due to proximal weakness. Placed his LEs in figure 4 position and pt was able to wash his feet. Pericare/lifting pants over hips completed rolling R<L with Max A to right, Min A to left. Max multimodal cues for rolling technique in order for pt to actively assist. [name] was educated on technique. At end of sesssion, pt was repositioned in bed for comfort and left with all needs within reach and bed alarm activated.      Therapy Documentation Precautions:  Precautions Precautions: Fall Precaution Comments: L hemi; pushes L Restrictions Weight Bearing Restrictions: No   Pain: No c/o pain during session  Pain Assessment Pain Assessment: No/denies  pain ADL: ADL ADL Comments: refer to functional navigator     See Function Navigator for Current Functional Status.   Therapy/Group: Individual Therapy  [name] A Demario Faniel 03/01/2017, 12:17 PM

## 2017-03-01 NOTE — Progress Notes (Signed)
Speech Language Pathology Daily Session Note  Patient Details  Name: Anthony Fisher MRN: [id number] Date of Birth: [birth year]  Today's Date: 03/01/2017 SLP Individual Time: 1230-1330 SLP Individual Time Calculation (min): 60 min  Short Term Goals: Week 1: SLP Short Term Goal 1 (Week 1): Pt will demonstrate focused attention to familiar task for ~5 minutes with Min A verbal cues.  SLP Short Term Goal 2 (Week 1): Patient will utilize an increased vocal intensity to maximize speech intelligibility to ~90% at the phrase level with Min A verbal cues.  SLP Short Term Goal 3 (Week 1): Patient will consume current diet with minimal overt s/s of aspiration with Max A verbal cues for use of swallowing compenstory strategies.  SLP Short Term Goal 4 (Week 1): Patient will consume trials of ice chips with overt s/s of aspiration in 25% of trials or less over 2 sessions with Mod A verbal cues to assess readiness for repeat MBS.  SLP Short Term Goal 5 (Week 1): Patient will demonstrate management of secretions and will initiate swallowing his saliva in 50% of opportunities with Mod A verbal cues.   Skilled Therapeutic Interventions: Of note, SLP attempted to see patient at scheduled 1130 time; however, patient asleep in the chair and unable to arouse despite Max assist multimodal cues; as a result SLP returned an hour later for session.    Skilled treatment session focused on addressing dysphagia and cognition goals.  SLP facilitated session by providing Mod-Max assist verbal cues for sustained attention for ~2 minute intervals and initiation with self-feeding. Patient consumed his lunch of Dys. 1 textures and nectar-thick liquids via straw with reflexive cough x1 at end of meal, suspect to be due to poor awareness of oral residue.  Patient required Max assist verbal cues to self-monitor and correct left sided anterior loss of boluses as well as oral residue.  Cues for liquid washes most effective means of  reducing oral residue.  Patient also required Mod verbal cues for consistent use of small bites and a slow rate of self-feeding.  Oral care via suctioning performed at end of session to effectively reduce oral residue prior to return to bed to nap.  Patient required Total assist to use the call bell to request transfer assist.  RN present with SLP to complete a MaxiMove transfer back to bed at end of session.  Patient left upright in bed with RN present.  Continue with current plan of care.  Function:  Eating Eating   Modified Consistency Diet: Yes Eating Assist Level: More than reasonable amount of time;Set up assist for;Supervision or verbal cues;Helper checks for pocketed food;Helper scoops food on utensil;Help managing cup/glass   Eating Set Up Assist For: Opening containers;Cutting food Helper [place] on Utensil: Occasionally     Cognition Comprehension Comprehension assist level: Understands basic 50 - 74% of the time/ requires cueing 25 - 49% of the time  Expression   Expression assist level: Expresses basic 25 - 49% of the time/requires cueing 50 - 75% of the time. Uses single words/gestures.  Social Interaction Social Interaction assist level: Interacts appropriately less than 25% of the time. May be withdrawn or combative.  Problem Solving Problem solving assist level: Solves basic less than 25% of the time - needs direction nearly all the time or does not effectively solve problems and may need a restraint for safety  Memory Memory assist level: Recognizes or recalls less than 25% of the time/requires cueing greater than 75% of the time  Pain Pain Assessment Pain Assessment: No/denies pain  Therapy/Group: Individual Therapy  Carmelia Roller., CCC-SLP [phone]  [place] 03/01/2017, 3:19 PM

## 2017-03-01 NOTE — Progress Notes (Signed)
Physical Therapy Session Note  Patient Details  Name: Anthony Fisher MRN: [id number] Date of Birth: 24-Feb-1926  Today's Date: 03/01/2017 PT Individual Time: 1027-2536 PT Individual Time Calculation (min): 70 min   Short Term Goals: Week 1:  PT Short Term Goal 1 (Week 1): Pt will complete bed mobility with max assist. PT Short Term Goal 2 (Week 1): Pt will complete bed<>chair transfers with max assist.  Skilled Therapeutic Interventions/Progress Updates:  Pt received asleep in bed with daughter [name]) present during session. Pt required max multimodal cuing for increased alertness with little progress. Attempted to transfer pt to EOB with max assist to increase alertness but pt still lethargic. Transferred pt bed>TIS w/c via maximove and pt with increased alertness once sitting in w/c and after therapist pushed him around nurses station. Pt with more engagement in activities and tolerated standing in standing frame x 25 minutes for increased weight bearing through BLE. While in standing frame pt engaged in activities focusing on L attention, midline orientation, weight shifting and posture. Pt continues to push L but is able to correct and shift R with max multimodal cuing. Pt able to follow one step commands but pt with difficulty writing name and inability to recall correct age. Pt able to recall that he is in a hospital from a choice of 2 but unable to recall that he is in [place] from choice of 2. Throughout session therapist educated [name] on purpose of therapy treatments and potential modifications to pt's schedule to allow him more rest breaks throughout the day; [name] actively participated during entire session. Therapist performed PROM elbow extension to LUE to prevent muscle tightness. At end of session pt left sitting in TIS w/c with QRB donned & daughter present to supervise.  Therapy Documentation Precautions:  Precautions Precautions: Fall Precaution Comments: L hemi; pushes  L Restrictions Weight Bearing Restrictions: No  Pain: Pt denied c/o pain.  See Function Navigator for Current Functional Status.   Therapy/Group: Individual Therapy  [name] 03/01/2017, 12:35 PM

## 2017-03-01 NOTE — Progress Notes (Signed)
[place] PHYSICAL MEDICINE & REHABILITATION     PROGRESS NOTE    Subjective/Complaints: Working with OT. Had a fair night.   ROS: pt denies nausea, vomiting, diarrhea, cough, shortness of breath or chest pain   Objective: Vital Signs: Blood pressure (!) 168/75, pulse 81, temperature 98.1 F (36.7 C), temperature source Axillary, resp. rate 16, weight 81 kg (178 lb 8 oz), SpO2 97 %. No results found. No results for input(s): WBC, HGB, HCT, PLT in the last 72 hours.  Recent Labs  02/27/17 0515 03/01/17 0658  NA 140 138  K 3.5 3.9  CL 105 104  GLUCOSE 128* 135*  BUN 27* 22*  CREATININE 0.78 0.77  CALCIUM 8.9 9.4   CBG (last 3)  No results for input(s): GLUCAP in the last 72 hours.  Wt Readings from Last 3 Encounters:  03/01/17 81 kg (178 lb 8 oz)  02/23/17 85.7 kg (189 lb)    Physical Exam:  Constitutional: no distress.  HENT:  Head: Normocephalic and atraumatic.  Eyes: PERRL.  Neck: Normal range of motion. Neck supple.  Cardiovascular: RRR  . Respiratory: CTA. No rales. No distress.  GI: Soft. Bowel sounds are normal. He exhibits no distension. There is no tenderness. There is no rebound.  Genitourinary:  Genitourinary-garment Musculoskeletal: He exhibits no edema or tenderness.  Neurological:  Awake. Left inattention.  Left facial weakness with moderate to severe dysarthria    Delayed reaction. Oriented to self.  LUE 2+ to 3-/5. LLE 2-3-/5.  Decreased sense of pain left arm/leg   Skin: Skin is warm and dry.  Psychiatric:  Flat. Slow to initiate.   Assessment/Plan: 1. Functional and cognitive deficits secondary to right thalamic hemorrhage which require 3+ hours per day of interdisciplinary therapy in a comprehensive inpatient rehab setting. Physiatrist is providing close team supervision and 24 hour management of active medical problems listed below. Physiatrist and rehab team continue to assess barriers to discharge/monitor patient progress toward  functional and medical goals.  Function:  Bathing Bathing position Bathing activity did not occur: Safety/medical concerns (pt too lethargic to participate) Position: Bed (supine to sitting in wheelchair)  Bathing parts Body parts bathed by patient: Chest, Abdomen, Left arm Body parts bathed by helper: Buttocks, Right upper leg, Left upper leg, Right lower leg, Left lower leg, Front perineal area, Back  Bathing assist Assist Level: 2 helpers      Upper Body Dressing/Undressing Upper body dressing   What is the patient wearing?: Pull over shirt/dress     Pull over shirt/dress - Perfomed by patient: Thread/unthread right sleeve Pull over shirt/dress - Perfomed by helper: Thread/unthread right sleeve, Thread/unthread left sleeve, Put head through opening, Pull shirt over trunk        Upper body assist Assist Level: 2 helpers      Lower Body Dressing/Undressing Lower body dressing   What is the patient wearing?: Pants, Non-skid slipper socks       Pants- Performed by helper: Thread/unthread right pants leg, Thread/unthread left pants leg, Pull pants up/down   Non-skid slipper socks- Performed by helper: Don/doff right sock, Don/doff left sock                  Lower body assist        Toileting Toileting Toileting activity did not occur: Safety/medical concerns (bedpan or blue pad)   Toileting steps completed by helper: Adjust clothing prior to toileting, Performs perineal hygiene, Adjust clothing after toileting    Toileting assist Assist level:  Two helpers   Transfers Chair/bed transfer   Chair/bed transfer method: Stand pivot Chair/bed transfer assist level: 2 helpers Chair/bed transfer assistive device: Mechanical lift (Maxi move) Mechanical lift: Maximove   Locomotion Ambulation Ambulation activity did not occur: Safety/medical Editor, commissioning activity did not occur: Safety/medical concerns     Assist Level: Dependent (Pt  equals 0%) (TIS)  Cognition Comprehension Comprehension assist level: Understands basic 50 - 74% of the time/ requires cueing 25 - 49% of the time  Expression Expression assist level: Expresses basic 25 - 49% of the time/requires cueing 50 - 75% of the time. Uses single words/gestures.  Social Interaction Social Interaction assist level: Interacts appropriately less than 25% of the time. May be withdrawn or combative.  Problem Solving Problem solving assist level: Solves basic less than 25% of the time - needs direction nearly all the time or does not effectively solve problems and may need a restraint for safety  Memory Memory assist level: Recognizes or recalls less than 25% of the time/requires cueing greater than 75% of the time   Medical Problem List and Plan: 1.  Functional deficits secondary to right thalamic hemorrhage             -continue CIR therapies  -  2.  DVT Prophylaxis/Anticoagulation: Pharmaceutical: Lovenox 3. Pain Management: Tylenol prn.  4. Mood: LCSW to follow for evaluation and support when appropriate.  5. Neuropsych: This patient is not capable of making decisions on jisown behalf.  -sleep improving  -limit neurosedating meds as possible  -rx UTI  -initiate low dose ritalin 6. Skin/Wound Care: routine pressure relief measures. Maintain adequate nutritional and hydration status.  7. Fluids/Electrolytes/Nutrition:   Encourage nectar thick liquids intake.   -eating and drinking better. Will hold IVF 8. Dementia: Monitor sleep wake cycle. Maintain consistent schedule.  9. HTN:  Remains somewhat labile -- On Norvasc daily with Prinivil bid--titrate as needed 10. Pre- renal azotemia:      -holding IVF given increasd intake  .    11. Urinary retention:      -proteus and enterococcus UTI---changed to amoxil---continue for 7 days  -continue voiding trial, I/O cath prn 12. Anemia:  Hgb 12.9.  13. Dysphagia: On dysphagia 1, nectar liquids with aspiration precautions.   14. Hyperglycemia: Hgb A1C- 5.5. Likely due to thickened juices for hydration.  15. Mild CHF due to Fluid overload: Monitor weights daily especially with IVF  -. Low salt diet.   -weight up at 81kg today    LOS (Days) 6 A FACE TO FACE EVALUATION WAS PERFORMED  Meredith Staggers, MD 03/01/2017 11:18 AM

## 2017-03-01 NOTE — Discharge Instructions (Signed)
Inpatient Rehab Discharge Instructions  [place] Discharge date and time:    Activities/Precautions/ Functional Status: Activity: no lifting, driving, or strenuous exercise for till cleared by MD Diet:  Wound Care:    Functional status:  ___ No restrictions     ___ Walk up steps independently _X__ 24/7 supervision/assistance   ___ Walk up steps with assistance ___ Intermittent supervision/assistance  ___ Bathe/dress independently ___ Walk with walker     _X__ Bathe/dress with assistance ___ Walk Independently    ___ Shower independently ___ Walk with assistance    ___ Shower with assistance _X__ No alcohol     ___ Return to work/school ________  Special Instructions:    My questions have been answered and I understand these instructions. I will adhere to these goals and the provided educational materials after my discharge from the hospital.  Patient/Caregiver Signature _______________________________ Date __________  Clinician Signature _______________________________________ Date __________  Please bring this form and your medication list with you to all your follow-up doctor's appointments.

## 2017-03-02 ENCOUNTER — Inpatient Hospital Stay (HOSPITAL_COMMUNITY): Payer: Medicare Other | Admitting: Speech Pathology

## 2017-03-02 ENCOUNTER — Inpatient Hospital Stay (HOSPITAL_COMMUNITY): Payer: Medicare Other | Admitting: Occupational Therapy

## 2017-03-02 ENCOUNTER — Inpatient Hospital Stay (HOSPITAL_COMMUNITY): Payer: Medicare Other | Admitting: Physical Therapy

## 2017-03-02 NOTE — Progress Notes (Signed)
[place] PHYSICAL MEDICINE & REHABILITATION     PROGRESS NOTE    Subjective/Complaints: No new issues. Slept last night. Denies pain  ROS: Limited due cognitive/behavioral    Objective: Vital Signs: Blood pressure (!) 160/71, pulse 80, temperature 98 F (36.7 C), temperature source Oral, resp. rate 20, weight 78 kg (171 lb 15.3 oz), SpO2 95 %. No results found. No results for input(s): WBC, HGB, HCT, PLT in the last 72 hours.  Recent Labs  03/01/17 0658  NA 138  K 3.9  CL 104  GLUCOSE 135*  BUN 22*  CREATININE 0.77  CALCIUM 9.4   CBG (last 3)  No results for input(s): GLUCAP in the last 72 hours.  Wt Readings from Last 3 Encounters:  03/02/17 78 kg (171 lb 15.3 oz)  02/23/17 85.7 kg (189 lb)    Physical Exam:  Constitutional: no distress.  HENT:  Head: Normocephalic and atraumatic.  Eyes: PERRL.  Neck: Normal range of motion. Neck supple.  Cardiovascular: RRR  . Respiratory: CTA B  GI: Soft. Bowel sounds are normal. He exhibits no distension. There is no tenderness. There is no rebound.  Genitourinary:  Genitourinary-garment Musculoskeletal: He exhibits no edema or tenderness.  Neurological:  Awake. Left inattention.  Left facial weakness with moderate to severe dysarthria    Delayed reaction. Oriented to self.  LUE 2+ to 3-/5. LLE 2-3-/5.  Decreased sense of pain left arm/leg persists  Skin: Skin is warm and dry.  Psychiatric:  Flat. ?initiates a bit more easily.   Assessment/Plan: 1. Functional and cognitive deficits secondary to right thalamic hemorrhage which require 3+ hours per day of interdisciplinary therapy in a comprehensive inpatient rehab setting. Physiatrist is providing close team supervision and 24 hour management of active medical problems listed below. Physiatrist and rehab team continue to assess barriers to discharge/monitor patient progress toward functional and medical goals.  Function:  Bathing Bathing position Bathing activity  did not occur: Safety/medical concerns (pt too lethargic to participate) Position: Bed  Bathing parts Body parts bathed by patient: Chest, Abdomen, Left arm, Right upper leg, Left upper leg Body parts bathed by helper: Buttocks, Right lower leg, Left lower leg, Front perineal area, Back  Bathing assist Assist Level: 2 helpers      Upper Body Dressing/Undressing Upper body dressing   What is the patient wearing?: Pull over shirt/dress     Pull over shirt/dress - Perfomed by patient: Thread/unthread right sleeve Pull over shirt/dress - Perfomed by helper: Thread/unthread left sleeve, Put head through opening, Pull shirt over trunk, Thread/unthread right sleeve        Upper body assist Assist Level: 2 helpers      Lower Body Dressing/Undressing Lower body dressing   What is the patient wearing?: Pants, Non-skid slipper socks       Pants- Performed by helper: Thread/unthread right pants leg, Thread/unthread left pants leg, Pull pants up/down   Non-skid slipper socks- Performed by helper: Don/doff right sock, Don/doff left sock                  Lower body assist        Toileting Toileting Toileting activity did not occur: Safety/medical concerns (bedpan or blue pad)   Toileting steps completed by helper: Adjust clothing prior to toileting, Performs perineal hygiene, Adjust clothing after toileting    Toileting assist Assist level: Two helpers   Transfers Chair/bed transfer   Chair/bed transfer method: Stand pivot Chair/bed transfer assist level: dependent (Pt equals 0%) Chair/bed  transfer assistive device: Mechanical lift Mechanical lift: Maximove   Locomotion Ambulation Ambulation activity did not occur: Safety/medical Editor, commissioning activity did not occur: Safety/medical concerns     Assist Level: Dependent (Pt equals 0%) (TIS)  Cognition Comprehension Comprehension assist level: Understands basic 50 - 74% of the time/ requires  cueing 25 - 49% of the time  Expression Expression assist level: Expresses basic 50 - 74% of the time/requires cueing 25 - 49% of the time. Needs to repeat parts of sentences.  Social Interaction Social Interaction assist level: Interacts appropriately less than 25% of the time. May be withdrawn or combative.  Problem Solving Problem solving assist level: Solves basic less than 25% of the time - needs direction nearly all the time or does not effectively solve problems and may need a restraint for safety  Memory Memory assist level: Recognizes or recalls less than 25% of the time/requires cueing greater than 75% of the time   Medical Problem List and Plan: 1.  Functional deficits secondary to right thalamic hemorrhage             -continue CIR therapies  -team conference today 2.  DVT Prophylaxis/Anticoagulation: Pharmaceutical: Lovenox 3. Pain Management: Tylenol prn.  4. Mood: LCSW to follow for evaluation and support when appropriate.  5. Neuropsych: This patient is not capable of making decisions on jisown behalf.  -sleep improving  -limit neurosedating meds as possible  -rx UTI  -continue low dose ritalin trial bp/hr permitting 6. Skin/Wound Care: routine pressure relief measures. Maintain adequate nutritional and hydration status.  7. Fluids/Electrolytes/Nutrition:   Encourage nectar thick liquids intake.   -eating and drinking better. No longer on IVF  -follow up BMET yesterday with IMPROVED BUN!! 8. Dementia: Monitor sleep wake cycle. Maintain consistent schedule.  9. HTN:  Remains somewhat labile -- On Norvasc daily with Prinivil bid--titrate as needed 10. Pre- renal azotemia:      -nearly resolved  .    11. Urinary retention:      -proteus and enterococcus UTI---changed to amoxil---continue for 7 days  -continue voiding trial, I/O cath prn 12. Anemia:  Hgb 12.9.  13. Dysphagia: On dysphagia 1, nectar liquids with aspiration precautions.  14. Hyperglycemia: Hgb A1C- 5.5.  Likely due to thickened juices for hydration.  15. Mild CHF due to Fluid overload: Monitor weights daily especially with IVF  -. Low salt diet.   -weight 78kg today    LOS (Days) 7 A FACE TO FACE EVALUATION WAS PERFORMED  Meredith Staggers, MD 03/02/2017 12:15 PM

## 2017-03-02 NOTE — Progress Notes (Signed)
Occupational Therapy Session Note  Patient Details  Name: Anthony Fisher MRN: [id number] Date of Birth: [birth year]  Today's Date: 03/02/2017 OT Individual Time: 1100-1200 OT Individual Time Calculation (min): 60 min    Short Term Goals: Week 1:  OT Short Term Goal 1 (Week 1): Pt will be able to sit at EOB with mod A to engage in self care. OT Short Term Goal 2 (Week 1): Pt will be able to squat pivot to  BSC with max A of 1. OT Short Term Goal 3 (Week 1): Pt will bathe UB with min A. OT Short Term Goal 4 (Week 1): Pt will don shirt with mod A.  Skilled Therapeutic Interventions/Progress Updates:    Upon entering the room,pt supine and sleeping in bed with daughter present in room. Pt is lethargic this session and cold wash cloth placed on face to alert pt. Pt taking over 4 minutes to initiate removing cloth from face. OT provided total A for supine >sit on EOB with pt needing min - total A for sitting balance this session. Pt seated on EOB for 20 minutes during UB self care tasks. Pt very perseverative on washing face and needing max multimodal cues for initiation and sequencing of functional tasks. Total A of 2 needed for sit >supine and rolling in bed for LB self care. Pt remained in bed at end of session secondary to safety concerns with lethargy this session. Bed alarm set and daughter remains present in room.   Therapy Documentation Precautions:  Precautions Precautions: Fall Precaution Comments: L hemi; pushes L Restrictions Weight Bearing Restrictions: No General:   Vital Signs:  Pain: Pain Assessment Pain Assessment: No/denies pain ADL: ADL ADL Comments: refer to functional navigator Exercises:   Other Treatments:    See Function Navigator for Current Functional Status.   Therapy/Group: Individual Therapy  Gypsy Decant 03/02/2017, 12:27 PM

## 2017-03-02 NOTE — Progress Notes (Signed)
Social Work  Social Work Assessment and Plan  Patient Details  Name: Anthony Fisher MRN: [id number] Date of Birth: [birth year]  Today's Date: 02/26/2017  Problem List:  Patient Active Problem List   Diagnosis Date Noted  . 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 IVH (intraventricular hemorrhage) ([place]) 02/23/2017  . Hypertensive emergency 02/23/2017  . Dysphagia due to recent stroke 02/23/2017  . Urinary retention 02/23/2017  . Dementia 02/23/2017  . Congestive heart failure ([place]) 02/23/2017  . Hypokalemia 02/23/2017  . Thalamic hemorrhage ([place]) 02/23/2017  . 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 02/16/2017   Past Medical History:  Past Medical History:  Diagnosis Date  . Adenomatous polyp of colon   . Dementia    Past Surgical History:  Past Surgical History:  Procedure Laterality Date  . CATARACT EXTRACTION W/ INTRAOCULAR LENS  IMPLANT, BILATERAL     Social History:  reports that he has never smoked. He has never used smokeless tobacco. He reports that he does not drink alcohol or use drugs.  Family / Support Systems Marital Status: Widow/Widower How Long?: 18+ yrs Patient Roles: Parent, Other (Comment) (grandparent) Children: daughter, Dorothea Glassman @ [phone];  son, Max Tungate @ (C) [phone] Other Supports: grandchildren ages 46, 34 and 80 Anticipated Caregiver: Daughter and family Ability/Limitations of Caregiver: Son works days, daughter has flexible schedule working as a Clinical biochemist.  Grandchildren ages 22, 57 and 44 can assist.  Boyfriend of 62 yo has CNA experience. Caregiver Availability: 24/7 Family Dynamics: Daughter describes a very supportive family. Notes son and his children have lived with pt for 12 yrs and very involved.    Social History Preferred language: English Religion:  Cultural Background: NA Read: Yes Write: Yes Employment Status: Retired Insurance underwriter Issues: None Guardian/Conservator: None - per MD, pt is not capable of making decisions on his own - defer to daughter/ son   Abuse/Neglect Physical Abuse: Denies Verbal Abuse: Denies Sexual Abuse: Denies Exploitation of patient/patient's resources: Denies Self-Neglect: Denies  Emotional Status Pt's affect, behavior adn adjustment status: Pt with communication and cognitive difficulties and not able to complete assessment interview himself.  Met with daughter who provides almost all info for him.  He denies any emotional distress and does not appear to be distressed.  Will monitor and refer for neuropsychology consult as indicated as daughter does point out that he was completely independent prior to this. Recent Psychosocial Issues: None Pyschiatric History: None Substance Abuse History: None  Patient / Family Perceptions, Expectations & Goals Pt/Family understanding of illness & functional limitations: Pt not oriented to place with me or to situation.  Daughter with basic understanding of his "stroke" and current functional limitations/ need for CIR.  She says she is fully aware that he will need 24/7, physical assistance at d/c. Premorbid pt/family roles/activities: Pt was independent PTA.  Out to breakfast qd with his daughter.  She denies any concerns for dementia (but is noted in chart) and feels any memory issues he showed were due to age. Anticipated changes in roles/activities/participation: Pt expected to require 24/7 physical assistance at d/c.  Daughter prepared to be his primary caregiver, however, other family members to assist with care as well. Pt/family expectations/goals: Family aware that of his expected assist needs but hopeful we can reach as low a level as possible.  Community Resources [employer] Scripts: None Premorbid Home Care/DME Agencies: None Transportation available at discharge: yes Resource referrals recommended:  Neuropsychology, Support  group (specify)  Discharge Planning Living Arrangements: Children Support Systems: Children, Other relatives, Friends/neighbors Type of Residence: Private residence Insurance Resources: [employer] ([place] Medicare) Financial Resources: Social Security Financial Screen Referred: No Living Expenses: Own Money Management: Family Does the patient have any problems obtaining your medications?: No Home Management: family Patient/Family Preliminary Plans: Plans to return to his home with family providing 24/7 assistance. Barriers to Discharge: Steps (but daughter is a Clinical biochemist and plans to make modifications and place ramps where needed) Social Work Anticipated Follow Up Needs: HH/OP Expected length of stay: 3-4 weeks  Clinical Impression Elderly gentleman here after a fall at home with ICH.  Cannot complete assessment interview himself due to severe cognitive and arousal deficits.  Daughter, [name], very pleasant and provides needed information.  Will need to monitor mood and will refer for neuropsychology as indicated.  Family very committed to providing 24/7 assistance at home and aware he will need hands-on care.  Will follow for support and d/c planning needs.  Michi Herrmann 02/26/2017, 3:52 PM

## 2017-03-02 NOTE — Care Management Note (Signed)
[facility] Individual Statement of Services  Patient Name:  Anthony Fisher  Date:  03/02/2017  Welcome to the [facility].  Our goal is to provide you with an individualized program based on your diagnosis and situation, designed to meet your specific needs.  With this comprehensive rehabilitation program, you will be expected to participate in at least 3 hours of rehabilitation therapies Monday-Friday, with modified therapy programming on the weekends.  Your rehabilitation program will include the following services:  Physical Therapy (PT), Occupational Therapy (OT), Speech Therapy (ST), 24 hour per day rehabilitation nursing, Therapeutic Recreaction (TR), Neuropsychology, Case Management (Social Worker), Rehabilitation Medicine, Nutrition Services and Pharmacy Services  Weekly team conferences will be held on Tuesdays to discuss your progress.  Your Social Worker will talk with you frequently to get your input and to update you on team discussions.  Team conferences with you and your family in attendance may also be held.  Expected length of stay: 3-4 weeks  Overall anticipated outcome: moderate assist  Depending on your progress and recovery, your program may change. Your Social Worker will coordinate services and will keep you informed of any changes. Your Social Worker's name and contact numbers are listed  below.  The following services may also be recommended but are not provided by the [facility] will be made to provide these services after discharge if needed.  Arrangements include referral to agencies that provide these services.  Your insurance has been verified to be:  [facility] Medicare Your primary doctor is:  Dr. Lorene Dy  Pertinent information will be shared with your doctor and your insurance  company.  Social Worker:  [place], [place] or ([phone]   Information discussed with and copy given to patient by: Lennart Pall, 03/02/2017, 3:55 PM

## 2017-03-02 NOTE — Progress Notes (Signed)
Speech Language Pathology Daily Session Note  Patient Details  Name: Anthony Fisher MRN: [id number] Date of Birth: 17-Sep-1926  Today's Date: 03/02/2017 SLP Individual Time: 0930-1030 SLP Individual Time Calculation (min): 60 min  Short Term Goals: Week 1: SLP Short Term Goal 1 (Week 1): Pt will demonstrate focused attention to familiar task for ~5 minutes with Min A verbal cues.  SLP Short Term Goal 2 (Week 1): Patient will utilize an increased vocal intensity to maximize speech intelligibility to ~90% at the phrase level with Min A verbal cues.  SLP Short Term Goal 3 (Week 1): Patient will consume current diet with minimal overt s/s of aspiration with Max A verbal cues for use of swallowing compenstory strategies.  SLP Short Term Goal 4 (Week 1): Patient will consume trials of ice chips with overt s/s of aspiration in 25% of trials or less over 2 sessions with Mod A verbal cues to assess readiness for repeat MBS.  SLP Short Term Goal 5 (Week 1): Patient will demonstrate management of secretions and will initiate swallowing his saliva in 50% of opportunities with Mod A verbal cues.   Skilled Therapeutic Interventions:Skilled treatment session focused on addressing dysphagia and cognitive goals. SLP facilitated session by providing Max-Total assist for completion of oral care via the suction tootbrush.  Patient consumed trials of ice chips without overt s/s of aspiration but required Max A verbal cues for oral manipulation/attention to bolus and swallow initiation. Patient also consumed nectar-thick liquids via straw without overt s/s of aspiration but required Max A verbal cues for use of small sips. Patient with intermittent left anterior spillage of saliva in which the patient required Max A verbal cues to self-monitor and correct. Recommend to continue trials with SLP at this time.  Patient with minimal verbal expression this session but was ~75% intelligible at the phrase level with Max A verbal  cues needed for use of an increased vocal intensity. Patient also required Max A verbal cues for sustained attention to tasks for ~2 minute intervals. Patient left upright in bed with all needs within reach and daughter present. Continue with current plan of care.      Function:  Eating Eating   Modified Consistency Diet: No (with trials from SLP ) Eating Assist Level: Helper checks for pocketed food;Supervision or verbal cues;Helper feeds patient   Eating Set Up Assist For: Opening containers;Cutting food       Cognition Comprehension Comprehension assist level: Understands basic 50 - 74% of the time/ requires cueing 25 - 49% of the time  Expression   Expression assist level: Expresses basic 50 - 74% of the time/requires cueing 25 - 49% of the time. Needs to repeat parts of sentences.  Social Interaction Social Interaction assist level: Interacts appropriately less than 25% of the time. May be withdrawn or combative.  Problem Solving Problem solving assist level: Solves basic 25 - 49% of the time - needs direction more than half the time to initiate, plan or complete simple activities  Memory Memory assist level: Recognizes or recalls less than 25% of the time/requires cueing greater than 75% of the time    Pain Pain Assessment Pain Assessment: No/denies pain  Therapy/Group: Individual Therapy  Anthony Fisher 03/02/2017, 2:18 PM

## 2017-03-02 NOTE — Progress Notes (Addendum)
Physical Therapy Session Note  Patient Details  Name: Anthony Fisher MRN: [id number] Date of Birth: September 25, 1926  Today's Date: 03/02/2017 PT Individual Time: 7253-6644 PT Individual Time Calculation (min): 53 min   Short Term Goals: Week 1:  PT Short Term Goal 1 (Week 1): Pt will complete bed mobility with max assist. PT Short Term Goal 2 (Week 1): Pt will complete bed<>chair transfers with max assist.  Skilled Therapeutic Interventions/Progress Updates:  Pt received in care of RN and therapist provided assistance with rolling and repositioning in bed to allow RN to complete peri hygiene total assist. Pt with poor awareness of situation requiring total assistance to maintain appropriate positioning and placement of hands. Once peri hygiene completed pt transferred supine>sitting with HOB elevated, bed rails and max assist with multimodal cuing. Pt with decreased alertness that slowly increased with sitting EOB. Pt completed stand pivot transfer bed<>w/c with +2 assist requiring assistance for weight shifting & placement. Pt required max assist to perform hand hygiene at sink and while sitting in w/c pt with significant L lateral lean. Transitioned to gym and pt completed stand pivot w/c<>mat table with +2 assist in same manner as noted above. While sitting on mat table, with use of mirror for visual feedback, pt able to maintain static sitting balance with close supervision after therapist assisted pt with finding midline. Pt only able to maintain position ~1 minute before LOB noted. Pt engaged in sitting balance activities focusing on weight shifting L/R, attention to L/R, following one step commands, and sitting balance. Pt with poor ability to return to midline without assistance and demonstrates significant LOB to L. Pt only able to follow one step commands 50% of the time and only able to recall he is in a hospital after being educated and when given choice of 2. Pt transferred sit>standing with  max assist +1 with good ability to push up with RUE. Pt able to maintain standing ~15 seconds with max assist & +2 for safety. Pt without any L knee buckling noted but with continued L lean and decreased weight shifting L/R. At end of session pt returned to bed in room and left with alarm set & RN present.   Therapy Documentation Precautions:  Precautions Precautions: Fall Precaution Comments: L hemi; pushes L Restrictions Weight Bearing Restrictions: No  Pain: Faces - no pain.   See Function Navigator for Current Functional Status.   Therapy/Group: Individual Therapy  [name] 03/02/2017, 5:38 PM

## 2017-03-02 NOTE — Progress Notes (Signed)
Physical Therapy Weekly Progress Note  Patient Details  Name: Anthony Fisher MRN: [id number] Date of Birth: May 19, 1926  Beginning of progress report period: February 24, 2017 End of progress report period: March 03, 2017  Today's Date: 03/03/2017  Patient has met 1 of 2 short term goals.  Pt is making slow progress towards functional goals as he continues to be limited by fatigue and reduced alertness. Pt is able to tolerate use of standing frame for prolonged periods of time and was able to stand with max assist for ~15 seconds without any L knee buckling noted. Pt would benefit from continued skilled PT treatment to focus on transfers, bed mobility, sitting balance, midline orientation, strengthening, neuromuscular re-education, and for continued family training.  Patient continues to demonstrate the following deficits muscle weakness, decreased cardiorespiratoy endurance, decreased coordination, decreased visual perceptual skills, decreased midline orientation, decreased attention to left, decreased attention to right and left side neglect, decreased initiation, decreased attention, decreased awareness, decreased problem solving, decreased safety awareness, decreased memory and delayed processing, and decreased sitting balance, decreased standing balance, decreased postural control, hemiplegia and decreased balance strategies and therefore will continue to benefit from skilled PT intervention to increase functional independence with mobility.  Patient progressing toward long term goals..  Continue plan of care.  PT Short Term Goals Week 1:  PT Short Term Goal 1 (Week 1): Pt will complete bed mobility with max assist. PT Short Term Goal 1 - Progress (Week 1): Met PT Short Term Goal 2 (Week 1): Pt will complete bed<>chair transfers with max assist. PT Short Term Goal 2 - Progress (Week 1): Progressing toward goal Week 2:  PT Short Term Goal 1 (Week 2): Pt will consistently transfer w/c<>bed with  max assist +1. PT Short Term Goal 2 (Week 2): Pt will maintain static sitting balance with min assist for 2 minutes.    Therapy Documentation Precautions:  Precautions Precautions: Fall Precaution Comments: L hemi; pushes L Restrictions Weight Bearing Restrictions: No   See Function Navigator for Current Functional Status.  Therapy/Group: Individual Therapy  [name] 03/03/2017, 11:25 AM

## 2017-03-02 NOTE — Progress Notes (Signed)
Speech Language Pathology Daily Session Note  Patient Details  Name: Anthony Fisher MRN: [id number] Date of Birth: [birth year]  Today's Date: 03/02/2017 SLP Individual Time: 0830-0900 SLP Individual Time Calculation (min): 30 min  Short Term Goals: Week 1: SLP Short Term Goal 1 (Week 1): Pt will demonstrate focused attention to familiar task for ~5 minutes with Min A verbal cues.  SLP Short Term Goal 2 (Week 1): Patient will utilize an increased vocal intensity to maximize speech intelligibility to ~90% at the phrase level with Min A verbal cues.  SLP Short Term Goal 3 (Week 1): Patient will consume current diet with minimal overt s/s of aspiration with Max A verbal cues for use of swallowing compenstory strategies.  SLP Short Term Goal 4 (Week 1): Patient will consume trials of ice chips with overt s/s of aspiration in 25% of trials or less over 2 sessions with Mod A verbal cues to assess readiness for repeat MBS.  SLP Short Term Goal 5 (Week 1): Patient will demonstrate management of secretions and will initiate swallowing his saliva in 50% of opportunities with Mod A verbal cues.   Skilled Therapeutic Interventions: Skilled treatment session focused on addressing dysphagia goals. SLP facilitated session by providing Max-Total assist for completion of oral care via suctioning.  Patient consumed ice chips via teaspoon with no overt s/s of aspiration in 5/5 trials with 1 swallow response.  Teaspoon sips of water resulted in no overt s/s of aspiration in 5/5 trials.  Small cup sips of water also resulted in 1-2 swallows with no overt s/s of aspiration and mild left anterior loss of boluses.  Educated patient and daughter on rationale for cues coughs following cup sips as a preventative given documented silent penetration; however, patient's volitional coughs were weak.  Recommend to continue trials with SLP at this time.  Continue with current plan of care.    Function:  Eating Eating    Modified Consistency Diet: No (trials with SLP) Eating Assist Level: Helper checks for pocketed food;Supervision or verbal cues;Helper feeds patient           Cognition Comprehension Comprehension assist level: Understands basic 50 - 74% of the time/ requires cueing 25 - 49% of the time  Expression   Expression assist level: Expresses basic 50 - 74% of the time/requires cueing 25 - 49% of the time. Needs to repeat parts of sentences.  Social Interaction Social Interaction assist level: Interacts appropriately less than 25% of the time. May be withdrawn or combative.  Problem Solving Problem solving assist level: Solves basic 25 - 49% of the time - needs direction more than half the time to initiate, plan or complete simple activities  Memory Memory assist level: Recognizes or recalls less than 25% of the time/requires cueing greater than 75% of the time    Pain Pain Assessment Pain Assessment: No/denies pain  Therapy/Group: Individual Therapy  Jessa Stinson 03/02/2017, 1:46 PM

## 2017-03-03 ENCOUNTER — Inpatient Hospital Stay (HOSPITAL_COMMUNITY): Payer: Medicare Other | Admitting: Occupational Therapy

## 2017-03-03 ENCOUNTER — Inpatient Hospital Stay (HOSPITAL_COMMUNITY): Payer: Self-pay | Admitting: Physical Therapy

## 2017-03-03 ENCOUNTER — Inpatient Hospital Stay (HOSPITAL_COMMUNITY): Payer: Medicare Other | Admitting: Physical Therapy

## 2017-03-03 ENCOUNTER — Inpatient Hospital Stay (HOSPITAL_COMMUNITY): Payer: Medicare Other | Admitting: Speech Pathology

## 2017-03-03 MED ORDER — METHYLPHENIDATE HCL 5 MG PO TABS
5.0000 mg | ORAL_TABLET | Freq: Two times a day (BID) | ORAL | Status: DC
  Administered 2017-03-04 – 2017-03-08 (×10): 5 mg via ORAL
  Filled 2017-03-03 (×12): qty 1

## 2017-03-03 NOTE — Patient Care Conference (Signed)
Inpatient RehabilitationTeam Conference and Plan of Care Update Date: 03/02/2017   Time: 2:45 PM    Patient Name: Anthony Fisher      Medical Record Number: [id number]  Date of Birth: [birth year] Sex: Male         Room/Bed: 4W17C/4W17C-01 Payor Info: Payor: [employer] MEDICARE / Plan: [facility] MEDICARE / Product Type: *No Product type* /    Admitting Diagnosis: R ICH  Admit Date/Time:  02/23/2017  8:02 PM Admission Comments: No comment available   Primary Diagnosis:  Thalamic hemorrhage (HCC) Principal Problem: Thalamic hemorrhage [facility])  Patient Active Problem List   Diagnosis Date Noted  . 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 IVH (intraventricular hemorrhage) ([place]) 02/23/2017  . Hypertensive emergency 02/23/2017  . Dysphagia due to recent stroke 02/23/2017  . Urinary retention 02/23/2017  . Dementia 02/23/2017  . Congestive heart failure ([place]) 02/23/2017  . Hypokalemia 02/23/2017  . Thalamic hemorrhage ([place]) 02/23/2017  . ICH (intracerebral hemorrhage) ([place]) - R thalamic hemorrhage 02/16/2017    Expected Discharge Date: Expected Discharge Date: 03/23/17  Team Members Present: Physician leading conference: Dr. Alger Simons Social Worker Present: Lennart Pall, LCSW Nurse Present: Heather Roberts, RN PT Present: Canary Brim, PT;Victoria Sabra Heck, PT OT Present: Roanna Epley, COTA SLP Present: Weston Anna, SLP PPS Coordinator present : Daiva Nakayama, RN, CRRN     Current Status/Progress Goal Weekly Team Focus  Medical   right thalamic hemorrhage. improved arousal. eating and drinking better--off IVF . initiation still an issue  improve engagement and intiation  nutrition/hydration/renal mgt, improving arousal   Bowel/Bladder   Incont of bowel. 3-26. I&O cath q8hrs. No void.   manage bowel mod assist. Bladder I&O cath total assist from caregiver.  assess bowel and bladder with education of I&O caths. encourgae void     Swallow/Nutrition/ Hydration   Dys. 1 textures with nectar-thick liquids, Max A  Min A with least restrictive diet  Trials of upgraded textures/liquids, use of swallowing compensatory strategies    ADL's   max A overall; functional transfers-max A/tot A; ongoing lethargy; sitting balance-mod A/max A  min A overall  activity tolerance, sitting balance, functional transfers, standing balance, BADL retraining, family education   Mobility   max<>+2 assist overall for bed mobility & transfers  mod assist overall  transfers, bed mobility, L NMR, attention, cognitive remediation, pt/family education   Communication   Max A   Min A   speech intelligibility, basic expression of wants/needs    Safety/Cognition/ Behavioral Observations  Mod-Max A for sustained attention  Min A  increased arousal/attention    Pain   denies         Skin   scattered bruising.   free skin breakdown min assist  assess skin q shift    Rehab Goals Patient on target to meet rehab goals: Yes *See Care Plan and progress notes for long and short-term goals.  Barriers to Discharge: severity of neuro deficits with poor inititation and inconsistent arousal    Possible Resolutions to Barriers:  neurostimulant, maintain improved sleep pattern, team engagement    Discharge Planning/Teaching Needs:  Pt to d/c home with daughter and other family members providing 24/7, physical assistance.  Teaching to be ongoing.  Daughter here daily.   Team Discussion:  MD began low dose of ritalin today to help arousal;  IVFs stopped.  Not voiding; incont bowel.  Currently max assist (some +2) to total assist with mobility and ADLs.  Trials of ice, thin liquids today but aspirating.  ST notes pt needs oral care after all meals.  Goals being set for min/ mod assist w/c level overall.  Revisions to Treatment Plan:  None   Continued Need for Acute Rehabilitation Level of Care: The patient requires daily medical management by a physician  with specialized training in physical medicine and rehabilitation for the following conditions: Daily direction of a multidisciplinary physical rehabilitation program to ensure safe treatment while eliciting the highest outcome that is of practical value to the patient.: Yes Daily medical management of patient stability for increased activity during participation in an intensive rehabilitation regime.: Yes Daily analysis of laboratory values and/or radiology reports with any subsequent need for medication adjustment of medical intervention for : Neurological problems;Renal problems;Nutritional problems  Anthony Fisher, [place] 03/03/2017, 4:21 PM

## 2017-03-03 NOTE — Progress Notes (Signed)
Physical Therapy Session Note  Patient Details  Name: Anthony Fisher MRN: [id number] Date of Birth: 27-Oct-1926  Today's Date: 03/03/2017 PT Individual Time: 0905-1005 PT Individual Time Calculation (min): 60 min   Skilled Therapeutic Interventions/Progress Updates:  Pt received in TIS w/c with daughter Anthony Fisher) present for session. Transported pt to gym via w/c total assist for time management. Pt completed transfers w/c<>mat table and w/c>bed with +2 assist with manual facilitation for weight shifting and for placement. Pt ambulated 5 ft with 3 muskateers assistance; pt demonstrated B knee flexion and required manual facilitation for upright posture. Pt with short step length RLE and required assistance to prevent L knee buckling with weight bearing. Pt able to activate LLE x 1 occasion to initiate advancement of limb. Pt returned to sitting on EOM & tolerated sitting x 30 minutes with use of mirror for visual feedback. Pt with improving ability to maintain static sitting and midline on this date. Pt requires multimodal cuing to return to midline when experiencing LOB and min assist progressing to max 2/2 fatigue. During session, therapist spent time orienting pt as well as focusing on L attention. At end of session pt returned to bed and left with bed alarm set, all needs within reach, and daughter present.  Provided Anthony Fisher with home measurement sheet to complete.  Therapy Documentation Precautions:  Precautions Precautions: Fall Precaution Comments: L hemi; pushes L Restrictions Weight Bearing Restrictions: No  Pain: Faces - no pain.  See Function Navigator for Current Functional Status.   Therapy/Group: Individual Therapy  [name] 03/03/2017, 11:55 AM

## 2017-03-03 NOTE — Progress Notes (Signed)
Physical Therapy Session Note  Patient Details  Name: Anthony Fisher MRN: [id number] Date of Birth: 03/05/1926  Today's Date: 03/03/2017 PT Individual Time: 1630-1700 PT Individual Time Calculation (min): 30 min   Short Term Goals: Week 1:  PT Short Term Goal 1 (Week 1): Pt will complete bed mobility with max assist. PT Short Term Goal 1 - Progress (Week 1): Met PT Short Term Goal 2 (Week 1): Pt will complete bed<>chair transfers with max assist. PT Short Term Goal 2 - Progress (Week 1): Progressing toward goal Week 2:  PT Short Term Goal 1 (Week 2): Pt will consistently transfer w/c<>bed with max assist +1. PT Short Term Goal 2 (Week 2): Pt will maintain static sitting balance with min assist for 2 minutes.   Skilled Therapeutic Interventions/Progress Updates:   Pt received sitting in WC and agreeable to PT  Squat pivot transfer with max assist from PT x 3 throughout treatment. Max cues for sequencing and improved lateral weight shifting as well as improved clearance of gluteal region to prevent sheer forces and improve safety of transf  Lateral scooting EOB with max assist from PT with max cues for proper UE placement.   Sitting balance with supervision assist from PT x 10 minutes. Pt needed min-mod cues to prevent posterior LOB .    Sit<>supine x 2 with mod assist to supine and max assist to sitting; PT provided max cues for techinque and safety as well as sequencing for BUE and BLE.   Pt left supine in bed with call bell in reach and all needs met.      Therapy Documentation Precautions:  Precautions Precautions: Fall Precaution Comments: L hemi; pushes L Restrictions Weight Bearing Restrictions: No   Pain: Pain Assessment Pain Assessment: No/denies pain   See Function Navigator for Current Functional Status.   Therapy/Group: Individual Therapy  Lorie Phenix 03/03/2017, 5:57 PM

## 2017-03-03 NOTE — Progress Notes (Signed)
Speech Language Pathology Daily Session Note  Patient Details  Name: Anthony Fisher MRN: [id number] Date of Birth: [birth year]  Today's Date: 03/03/2017 SLP Individual Time: 1300-1355 SLP Individual Time Calculation (min): 55 min  Short Term Goals: Week 1: SLP Short Term Goal 1 (Week 1): Pt will demonstrate focused attention to familiar task for ~5 minutes with Min A verbal cues.  SLP Short Term Goal 2 (Week 1): Patient will utilize an increased vocal intensity to maximize speech intelligibility to ~90% at the phrase level with Min A verbal cues.  SLP Short Term Goal 3 (Week 1): Patient will consume current diet with minimal overt s/s of aspiration with Max A verbal cues for use of swallowing compenstory strategies.  SLP Short Term Goal 4 (Week 1): Patient will consume trials of ice chips with overt s/s of aspiration in 25% of trials or less over 2 sessions with Mod A verbal cues to assess readiness for repeat MBS.  SLP Short Term Goal 5 (Week 1): Patient will demonstrate management of secretions and will initiate swallowing his saliva in 50% of opportunities with Mod A verbal cues.   Skilled Therapeutic Interventions: Skilled treatment session focused on dysphagia and speech intelligibility goals. SLP facilitated session by providing Mod-Max A verbal cues for sustained attention for ~2 minute intervals and initiation with self-feeding. Patient consumed his lunch meal of Dys. 1 textures with nectar-thick liquids without overt s/s of aspiration. Patient required Max A verbal cues to self-monitor and correct oral residue with liquid washes and tongue sweep and total A to self-monitor and correct left anterior spillage of both food and saliva. Patient also required Min A verbal cues for use of small bites and a slow rate. Patient independently requested to use the bathroom and oral care was performed prior to [facility] back to bed for use of bedpan due to moderate-severe oral residue. Patient followed 1  step commands for bed mobility with extra time but was unable to successfully void. Patient was also ~75% intelligible at the phrase level with Mod A verbal cues for use of an increased vocal intensity. Patient left upright in bed with RN present. Continue with current plan of care.      Function:  Eating Eating   Modified Consistency Diet: No Eating Assist Level: Helper checks for pocketed food;Supervision or verbal cues;Helper feeds patient   Eating Set Up Assist For: Opening containers;Cutting food Helper Scoops Food on Utensil: Occasionally     Cognition Comprehension Comprehension assist level: Understands basic 50 - 74% of the time/ requires cueing 25 - 49% of the time  Expression   Expression assist level: Expresses basic 50 - 74% of the time/requires cueing 25 - 49% of the time. Needs to repeat parts of sentences.  Social Interaction Social Interaction assist level: Interacts appropriately less than 25% of the time. May be withdrawn or combative.  Problem Solving Problem solving assist level: Solves basic 25 - 49% of the time - needs direction more than half the time to initiate, plan or complete simple activities  Memory Memory assist level: Recognizes or recalls less than 25% of the time/requires cueing greater than 75% of the time    Pain Pain Assessment Pain Assessment: No/denies pain  Therapy/Group: Individual Therapy  Nickisha Hum 03/03/2017, 5:28 PM

## 2017-03-03 NOTE — Progress Notes (Signed)
Occupational Therapy Session Note  Patient Details  Name: Anthony Fisher MRN: [id number] Date of Birth: May 31, 1926  Today's Date: 03/03/2017 OT Individual Time: 5885-0277 OT Individual Time Calculation (min): 59 min    Short Term Goals: Week 1:  OT Short Term Goal 1 (Week 1): Pt will be able to sit at EOB with mod A to engage in self care. OT Short Term Goal 2 (Week 1): Pt will be able to squat pivot to  BSC with max A of 1. OT Short Term Goal 3 (Week 1): Pt will bathe UB with min A. OT Short Term Goal 4 (Week 1): Pt will don shirt with mod A.  Skilled Therapeutic Interventions/Progress Updates:    Upon entering the room, pt in bed with HOB elevated and daughter, [name], assisting with breakfast. Pt is agreeable to OT intervention this session. Total A +2 for LB clothing management from bed level. Total A for rolling L <> R to pull pants over B hips. Total A for supine >sit to EOB. Pt seated on EOB with mod - max A for sitting balance with focus on anterior weight shift as well as midline orientation. Pt transferred to the R with R hand placed on arm rest of wheelchair in order to decrease pushing to L. Second helper needed to steady wheelchair and total A for squat pivot transfer bed >wheelchair. Pt engaged in UB self care from wheelchair level at sink with max A and Max multimodal cues for initiation and sequencing. Pt perseverating during this task as well with mod cues to attend to task. Pt performing oral can with suction toothbrush and set up A. Pt remaining in tilt in space wheelchair with quick release belt donned, daughter present, and L UE arm tray. All needs within reach.   Therapy Documentation Precautions:  Precautions Precautions: Fall Precaution Comments: L hemi; pushes L Restrictions Weight Bearing Restrictions: No General:   Vital Signs:  Pain:   ADL: ADL ADL Comments: refer to functional navigator Exercises:   Other Treatments:    See Function Navigator for  Current Functional Status.   Therapy/Group: Individual Therapy  Gypsy Decant 03/03/2017, 10:45 AM

## 2017-03-03 NOTE — Progress Notes (Signed)
[place] PHYSICAL MEDICINE & REHABILITATION     PROGRESS NOTE    Subjective/Complaints: Up with therapies. No new problems reported.   ROS: Limited due cognitive/behavioral    Objective: Vital Signs: Blood pressure (!) 157/87, pulse 82, temperature 98.7 F (37.1 C), temperature source Oral, resp. rate 15, weight 81.5 kg (179 lb 9.6 oz), SpO2 96 %. No results found. No results for input(s): WBC, HGB, HCT, PLT in the last 72 hours.  Recent Labs  03/01/17 0658  NA 138  K 3.9  CL 104  GLUCOSE 135*  BUN 22*  CREATININE 0.77  CALCIUM 9.4   CBG (last 3)  No results for input(s): GLUCAP in the last 72 hours.  Wt Readings from Last 3 Encounters:  03/03/17 81.5 kg (179 lb 9.6 oz)  02/23/17 85.7 kg (189 lb)    Physical Exam:  Constitutional: alert.  HENT:  Head: Normocephalic and atraumatic.  Eyes: PERRL.  Neck: Normal range of motion. Neck supple.  Cardiovascular: RRR  . Respiratory: CTA B  GI: Soft. Bowel sounds are normal. He exhibits no distension. There is no tenderness. There is no rebound.  Genitourinary:  Genitourinary-garment Musculoskeletal: He exhibits no edema or tenderness.  Neurological:  Awake/alert. Left inattention.  Left facial weakness with moderate to severe dysarthria    Delayed reaction. Oriented to self.  LUE 2+ to 3-/5. LLE 2-3-/5.  Decreased sense of pain left arm/leg  ---stable Skin: Skin is warm and dry.  Psychiatric:  Flat.  .   Assessment/Plan: 1. Functional and cognitive deficits secondary to right thalamic hemorrhage which require 3+ hours per day of interdisciplinary therapy in a comprehensive inpatient rehab setting. Physiatrist is providing close team supervision and 24 hour management of active medical problems listed below. Physiatrist and rehab team continue to assess barriers to discharge/monitor patient progress toward functional and medical goals.  Function:  Bathing Bathing position Bathing activity did not occur:  Safety/medical concerns (pt too lethargic to participate) Position: Wheelchair/chair at sink (UB only)  Bathing parts Body parts bathed by patient: Abdomen, Left arm, Chest Body parts bathed by helper: Right arm  Bathing assist Assist Level: Touching or steadying assistance(Pt > 75%)      Upper Body Dressing/Undressing Upper body dressing   What is the patient wearing?: Pull over shirt/dress     Pull over shirt/dress - Perfomed by patient: Thread/unthread right sleeve Pull over shirt/dress - Perfomed by helper: Thread/unthread left sleeve, Put head through opening, Pull shirt over trunk        Upper body assist Assist Level:  (max A)      Lower Body Dressing/Undressing Lower body dressing   What is the patient wearing?: Pants, Non-skid slipper socks       Pants- Performed by helper: Thread/unthread right pants leg, Thread/unthread left pants leg, Pull pants up/down   Non-skid slipper socks- Performed by helper: Don/doff right sock, Don/doff left sock                  Lower body assist Assist for lower body dressing: 2 Helpers      Toileting Toileting Toileting activity did not occur: Safety/medical concerns (bedpan or blue pad)   Toileting steps completed by helper: Adjust clothing prior to toileting, Performs perineal hygiene, Adjust clothing after toileting    Toileting assist Assist level: Two helpers   Transfers Chair/bed transfer   Chair/bed transfer method: Squat pivot Chair/bed transfer assist level: 2 helpers Chair/bed transfer assistive device: Mechanical lift Mechanical lift: Maximove  Locomotion Ambulation Ambulation activity did not occur: Safety/medical concerns   Max distance: 5 ft     Wheelchair Wheelchair activity did not occur: Safety/medical concerns     Assist Level: Dependent (Pt equals 0%) (TIS)  Cognition Comprehension Comprehension assist level: Understands basic 50 - 74% of the time/ requires cueing 25 - 49% of the time   Expression Expression assist level: Expresses basic 50 - 74% of the time/requires cueing 25 - 49% of the time. Needs to repeat parts of sentences.  Social Interaction Social Interaction assist level: Interacts appropriately less than 25% of the time. May be withdrawn or combative.  Problem Solving Problem solving assist level: Solves basic 25 - 49% of the time - needs direction more than half the time to initiate, plan or complete simple activities  Memory Memory assist level: Recognizes or recalls less than 25% of the time/requires cueing greater than 75% of the time   Medical Problem List and Plan: 1.  Functional deficits secondary to right thalamic hemorrhage             -continue CIR therapies  -team conference today 2.  DVT Prophylaxis/Anticoagulation: Pharmaceutical: Lovenox 3. Pain Management: Tylenol prn.  4. Mood: LCSW to follow for evaluation and support when appropriate.  5. Neuropsych: This patient is not capable of making decisions on jisown behalf.  -sleep improving  -limit neurosedating meds as possible  -rx UTI  -continue low dose ritalin trial bp/hr permitting---try 5mg  dose 6. Skin/Wound Care: routine pressure relief measures. Maintain adequate nutritional and hydration status.  7. Fluids/Electrolytes/Nutrition:   Encourage nectar thick liquids intake.   -eating and drinking better. No longer on IVF  -follow up BUN improved  -push fluids 8. Dementia: Monitor sleep wake cycle. Maintain consistent schedule.  9. HTN:  Remains somewhat labile -- On Norvasc daily with Prinivil bid--titrate as needed 10. Pre- renal azotemia:      -nearly resolved  .    11. Urinary retention:      -proteus and enterococcus UTI---changed to amoxil---continue for 7 days  -continue voiding trial, I/O cath prn 12. Anemia:  Hgb 12.9.  13. Dysphagia: On dysphagia 1, nectar liquids with aspiration precautions.  14. Hyperglycemia: Hgb A1C- 5.5. Likely due to thickened juices for hydration.  15.  Mild CHF due to Fluid overload: Monitor weights daily especially with IVF  -. Low salt diet.   -weight 81kg today    LOS (Days) 8 A FACE TO FACE EVALUATION WAS PERFORMED  Meredith Staggers, MD 03/03/2017 1:12 PM

## 2017-03-03 NOTE — Progress Notes (Signed)
Social Work Patient ID: Anthony Fisher, male   DOB: 11-27-1926, 83 y.o.   MRN: [id number]   Met with pt and daughter this morning to review team conference.  Pt more alert this morning.  Both aware and agreeable of targeted d/c date of 4/17 and min/mod assist goals overall.  PT present and I pointed out that daughter willing to make any home modification changes and PT was to provide with home measurement sheet.  Will continue to follow.  Halli Equihua, LCSW

## 2017-03-04 ENCOUNTER — Inpatient Hospital Stay (HOSPITAL_COMMUNITY): Payer: Medicare Other | Admitting: Occupational Therapy

## 2017-03-04 ENCOUNTER — Inpatient Hospital Stay (HOSPITAL_COMMUNITY): Payer: Medicare Other | Admitting: Speech Pathology

## 2017-03-04 ENCOUNTER — Inpatient Hospital Stay (HOSPITAL_COMMUNITY): Payer: Self-pay | Admitting: Physical Therapy

## 2017-03-04 LAB — C DIFFICILE QUICK SCREEN W PCR REFLEX
C DIFFICLE (CDIFF) ANTIGEN: NEGATIVE
C Diff interpretation: NOT DETECTED
C Diff toxin: NEGATIVE

## 2017-03-04 NOTE — Progress Notes (Signed)
Occupational Therapy Weekly Progress Note  Patient Details  Name: Anthony Fisher MRN: [id number] Date of Birth: 03/11/1926  Beginning of progress report period: March 04, 2017 End of progress report period: March 04, 2017  Today's Date: 03/04/2017 OT Individual Time: 7106-2694 OT Individual Time Calculation (min): 74 min    Patient has met 1 of 4 short term goals. Pt is making slow but steady progress this week towards occupational therapy goals. Pt has increased alertness during sessions and has been able to actively participate. Pt is able to transfer with assist of one person with second helper to steady equipment for safety. LB self care has been performed from bed level and UB self care seated in wheelchair at sink. His daughter has been present for sessions as well.   Patient continues to demonstrate the following deficits: muscle weakness, decreased cardiorespiratoy endurance, decreased coordination, field cut, decreased attention, decreased awareness, decreased problem solving and decreased safety awareness and decreased sitting balance, decreased standing balance, decreased postural control, hemiplegia and decreased balance strategies and therefore will continue to benefit from skilled OT intervention to enhance overall performance with BADL.  Patient progressing toward long term goals..  Continue plan of care.  OT Short Term Goals Week 2:  OT Short Term Goal 1 (Week 2): Pt will perform UB dressing with mod A in order to increase I with self care. OT Short Term Goal 2 (Week 2): Pt will perform toilet transfer with max A in order to decrease level of assist with functional transfer. OT Short Term Goal 3 (Week 2): Pt will demonstrate dynamic sitting balance of mod I for self care tasks sitting EOB  Skilled Therapeutic Interventions/Progress Updates:    Upon entering the room, pt supine in bed with daughter present in room. Pt was awake and alert with no c/o pain. Pt rolled L <> R with  max A to pull pants over B hips. Supine >sit with max A to EOB. Pt transferred from bed > wheelchair with max A stand pivot transfer and second person to steady equipment. Pt seated in wheelchair at sink for grooming tasks including brushing teeth, combing hair, and shaving face with set up A and encouragement for participation. OT propelled pt via wheelchair to day room for table top activity. OT placed pegs in straight line across peg board and asked pt to create same line. Pt required max multimodal cues to locate and visual scan pegs on R side of board. Pt unable to create simple design. OT educated caregiver on visual strategies and how they impact functional tasks. OT asked caregiver to sit on R side of pt in room in order to encourage head turns and visual scanning to the R. Pt returned to room with quick release belt donned and call bell within reach.   Therapy Documentation Precautions:  Precautions Precautions: Fall Precaution Comments: L hemi; pushes L Restrictions Weight Bearing Restrictions: Yes General:   Vital Signs:   Pain:   ADL: ADL ADL Comments: refer to functional navigator Exercises:   Other Treatments:    See Function Navigator for Current Functional Status.   Therapy/Group: Individual Therapy  Gypsy Decant 03/04/2017, 12:43 PM

## 2017-03-04 NOTE — Progress Notes (Signed)
Physical Therapy Session Note  Patient Details  Name: Anthony Fisher MRN: [id number] Date of Birth: February 09, 1926  Today's Date: 03/04/2017 PT Individual Time: 0800-0900 PT Individual Time Calculation (min): 60 min   Short Term Goals: Week 2:  PT Short Term Goal 1 (Week 2): Pt will consistently transfer w/c<>bed with max assist +1. PT Short Term Goal 2 (Week 2): Pt will maintain static sitting balance with min assist for 2 minutes.   Skilled Therapeutic Interventions/Progress Updates:   Pt in bed upon arrival, finishing breakfast with daughter's assist. Donning pants in bed with rolling, max assist needed and cues for sequence. Max assist supine to sitting EOB- initially with lean to Lt side and posterior. Working on leaning anterior and Lt/Rt prior to donning shirt. Pt needing sequential instructions to don shirt and max assistance. Transfers: performed bed<>w/c and w/c<> mat with squat pivot and max assist. Working on anterior weight shift and hand placement. Balance: sitting balance activities; static posture, leaning to elbow with press up to sitting, reaching bilaterally (encouraging use of Lt UE, diagonal reaches for cones at max controllable excursion (utilizing mirror as needed for visual feedback). Following session, pt up in TIS w/c, tipped for comfort. Daughter present and all needs in reach. Family confirms that pt is more drowsy this morning than usual.   Therapy Documentation Precautions:  Precautions Precautions: Fall Precaution Comments: L hemi; pushes L Restrictions Weight Bearing Restrictions: Yes Pain: Denies pain.  See Function Navigator for Current Functional Status.   Therapy/Group: Individual Therapy  Linard Millers, PT 03/04/2017, 2:51 PM

## 2017-03-04 NOTE — Progress Notes (Signed)
[place] PHYSICAL MEDICINE & REHABILITATION     PROGRESS NOTE    Subjective/Complaints: Sitting up in bed. Very alert. No complaints.   ROS: pt denies nausea, vomiting, diarrhea, cough, shortness of breath or chest pain     Objective: Vital Signs: Blood pressure 134/62, pulse 80, temperature 98.2 F (36.8 C), temperature source Oral, resp. rate 16, weight 81.5 kg (179 lb 9.6 oz), SpO2 98 %. No results found. No results for input(s): WBC, HGB, HCT, PLT in the last 72 hours. No results for input(s): NA, K, CL, GLUCOSE, BUN, CREATININE, CALCIUM in the last 72 hours.  Invalid input(s): CO CBG (last 3)  No results for input(s): GLUCAP in the last 72 hours.  Wt Readings from Last 3 Encounters:  03/04/17 81.5 kg (179 lb 9.6 oz)  02/23/17 85.7 kg (189 lb)    Physical Exam:  Constitutional: very alert.  HENT:  Head: Normocephalic and atraumatic.  Eyes: PERRL.  Neck: Normal range of motion. Neck supple.  Cardiovascular: RRR  . Respiratory: CTA B  GI: Soft. Bowel sounds are normal. He exhibits no distension. There is no tenderness. There is no rebound.  Genitourinary:  Genitourinary-garment Musculoskeletal: He exhibits no edema or tenderness.  Neurological:  Very alert. Left inattention--but engaged left side with cues Left facial weakness. Dysarthria improved.  Oriented to self.  LUE 2+ to 3-/5. LLE 2-3-/5.  Decreased sense of pain left arm/leg  ---stable Skin: Skin is warm and dry.  Psychiatric:  Flat.  .   Assessment/Plan: 1. Functional and cognitive deficits secondary to right thalamic hemorrhage which require 3+ hours per day of interdisciplinary therapy in a comprehensive inpatient rehab setting. Physiatrist is providing close team supervision and 24 hour management of active medical problems listed below. Physiatrist and rehab team continue to assess barriers to discharge/monitor patient progress toward functional and medical goals.  Function:  Bathing Bathing  position Bathing activity did not occur: Safety/medical concerns (pt too lethargic to participate) Position: Wheelchair/chair at sink (UB only)  Bathing parts Body parts bathed by patient: Abdomen, Left arm, Chest Body parts bathed by helper: Right arm  Bathing assist Assist Level: Touching or steadying assistance(Pt > 75%)      Upper Body Dressing/Undressing Upper body dressing   What is the patient wearing?: Pull over shirt/dress     Pull over shirt/dress - Perfomed by patient: Thread/unthread right sleeve Pull over shirt/dress - Perfomed by helper: Thread/unthread left sleeve, Put head through opening, Pull shirt over trunk        Upper body assist Assist Level:  (max A)      Lower Body Dressing/Undressing Lower body dressing   What is the patient wearing?: Pants, Non-skid slipper socks       Pants- Performed by helper: Thread/unthread right pants leg, Thread/unthread left pants leg, Pull pants up/down   Non-skid slipper socks- Performed by helper: Don/doff right sock, Don/doff left sock                  Lower body assist Assist for lower body dressing: 2 Helpers      Toileting Toileting Toileting activity did not occur: Safety/medical concerns (bedpan or blue pad)   Toileting steps completed by helper: Adjust clothing prior to toileting, Performs perineal hygiene, Adjust clothing after toileting    Toileting assist Assist level: Two helpers   Transfers Chair/bed transfer   Chair/bed transfer method: Squat pivot Chair/bed transfer assist level: 2 helpers Chair/bed transfer assistive device: Mechanical lift Mechanical lift: Maximove  Locomotion Ambulation Ambulation activity did not occur: Safety/medical concerns   Max distance: 5 ft     Wheelchair Wheelchair activity did not occur: Safety/medical concerns     Assist Level: Dependent (Pt equals 0%) (TIS)  Cognition Comprehension Comprehension assist level: Understands basic 50 - 74% of the time/  requires cueing 25 - 49% of the time  Expression Expression assist level: Expresses basic 50 - 74% of the time/requires cueing 25 - 49% of the time. Needs to repeat parts of sentences.  Social Interaction Social Interaction assist level: Interacts appropriately less than 25% of the time. May be withdrawn or combative.  Problem Solving Problem solving assist level: Solves basic 25 - 49% of the time - needs direction more than half the time to initiate, plan or complete simple activities  Memory Memory assist level: Recognizes or recalls less than 25% of the time/requires cueing greater than 75% of the time   Medical Problem List and Plan: 1.  Functional deficits secondary to right thalamic hemorrhage             -continue CIR therapies  -improved arousal and egagement 2.  DVT Prophylaxis/Anticoagulation: Pharmaceutical: Lovenox 3. Pain Management: Tylenol prn.  4. Mood: LCSW to follow for evaluation and support when appropriate.  5. Neuropsych: This patient is not capable of making decisions on jisown behalf.  -sleep improving  -limit neurosedating meds as possible  -rx'ed UTI  -continue low dose ritalin trial bp/hr permitting--- 5mg  dose is working 6. Skin/Wound Care: routine pressure relief measures. Maintain adequate nutritional and hydration status.  7. Fluids/Electrolytes/Nutrition:   Encourage nectar thick liquids intake.   -eating and drinking better. No longer on IVF  -follow up BUN improved  -push fluids 8. Dementia: Monitor sleep wake cycle. Maintain consistent schedule.  9. HTN:  Remains somewhat labile -- On Norvasc daily with Prinivil bid--titrate as needed 10. Pre- renal azotemia:      -improved  .    11. Urinary retention:      -proteus and enterococcus UTI---changed to amoxil---continue for 7 days  -continue voiding trial, I/O cath prn 12. Anemia:  Hgb 12.9.  13. Dysphagia: On dysphagia 1, nectar liquids with aspiration precautions.  14. Hyperglycemia: Hgb A1C- 5.5.  Likely due to thickened juices for hydration.  15. Mild CHF due to Fluid overload: Monitor weights daily especially with IVF  -. Low salt diet.   -weight 81kg again today    LOS (Days) 9 A FACE TO FACE EVALUATION WAS PERFORMED  Meredith Staggers, MD 03/04/2017 9:25 AM

## 2017-03-04 NOTE — Progress Notes (Signed)
Speech Language Pathology Weekly Progress and Session Note  Patient Details  Name: Anthony Fisher MRN: [id number] Date of Birth: 06-02-1926  Beginning of progress report period: February 24, 2017 End of progress report period: March 04, 2017  Today's Date: 03/04/2017 SLP Individual Time: 1110-1205 SLP Individual Time Calculation (min): 55 min  Short Term Goals: Week 1: SLP Short Term Goal 1 (Week 1): Pt will demonstrate focused attention to familiar task for ~5 minutes with Min A verbal cues.  SLP Short Term Goal 1 - Progress (Week 1): Not met SLP Short Term Goal 2 (Week 1): Patient will utilize an increased vocal intensity to maximize speech intelligibility to ~90% at the phrase level with Min A verbal cues.  SLP Short Term Goal 2 - Progress (Week 1): Not met SLP Short Term Goal 3 (Week 1): Patient will consume current diet with minimal overt s/s of aspiration with Max A verbal cues for use of swallowing compenstory strategies.  SLP Short Term Goal 3 - Progress (Week 1): Met SLP Short Term Goal 4 (Week 1): Patient will consume trials of ice chips with overt s/s of aspiration in 25% of trials or less over 2 sessions with Mod A verbal cues to assess readiness for repeat MBS.  SLP Short Term Goal 4 - Progress (Week 1): Not met SLP Short Term Goal 5 (Week 1): Patient will demonstrate management of secretions and will initiate swallowing his saliva in 50% of opportunities with Mod A verbal cues.  SLP Short Term Goal 5 - Progress (Week 1): Not met    New Short Term Goals: Week 2: SLP Short Term Goal 1 (Week 2): Pt will demonstrate sustained attention to familiar task for ~5 minutes with Min A verbal cues.  SLP Short Term Goal 2 (Week 2): Patient will utilize an increased vocal intensity to maximize speech intelligibility to ~90% at the phrase level with Min A verbal cues.  SLP Short Term Goal 3 (Week 2): Patient will consume current diet with minimal overt s/s of aspiration with Mod A verbal cues  for use of swallowing compenstory strategies.  SLP Short Term Goal 4 (Week 2): Patient will consume trials of ice chips with overt s/s of aspiration in 25% of trials or less over 2 sessions with Mod A verbal cues to assess readiness for repeat MBS.  SLP Short Term Goal 5 (Week 2): Patient will demonstrate management of secretions and will initiate swallowing his saliva in 50% of opportunities with Mod A verbal cues.   Weekly Progress Updates: Patient has made minimal gains and has met 1 of 5 STG's this reporting period. Currently, patient is consuming Dys. 1 textures with nectar-thick liquids via straw with minimal overt s/s of aspiration but requires Max-Total A for use of swallowing compensatory strategies. Patient continues to demonstrate severe oral residue during meals with decreased management of secretions, therefore, trials of upgraded textures have not been attempted. Trials of ice chips have been administered with minimal oral manipulation and intermittent verbal cues needed for swallow initiation. Recommend patient continue current diet with full supervision and trials with SLP only. Patient continues to demonstrate decreased vocal intensity which impacts his overall speech intelligibility at the phrase level. Patient is ~75% intelligible with Mod A verbal cues needed.  Patient also requires overall Max A verbal cues for sustained attention and initiation with functional tasks.  Patient and family education is ongoing. Patient would benefit from continued skilled SLP intervention to maximize his swallowing and cognitive function as well  as his speech intelligibility in order to improve his overall functional independence prior to discharge.      Intensity: Minumum of 1-2 x/day, 30 to 90 minutes Frequency: 3 to 5 out of 7 days Duration/Length of Stay: 4/17 Treatment/Interventions: Therapeutic Activities;Functional tasks;Dysphagia/aspiration precaution training;Cueing hierarchy;Speech/Language  facilitation;Patient/family education;Cognitive remediation/compensation;Internal/external aids;Environmental controls   Daily Session  Skilled Therapeutic Interventions: Skilled treatment session focused on dysphagia and cognitive goals. Upon arrival, patient was awake but appeared lethargic. SLP facilitated session by providing tray set-up and skilled observation with lunch meal of Dys. 1 textures with nectar-thick liquids via straw. Patient required Mod A verbal cues for swallow initiation and Max-Total A to self-monitor and correct oral residue and left anteruor spillage of bolus and saliva. Visual feedback was provided via a mirror in hopes of maximizing awareness, however, it just appeared more distracting to the patient as he consistently made comments about his "gray hair."  Patient demonstrated overt cough and subtle throat clear X 2, suspect due to fatigue and suspected delayed swallow initiation. Oral care was provided at end of meal to clear oral residue. Patient required Mod A verbal cues for initiation of self-feeding and for sustained attention to task for ~2 minute intervals. Patient left upright in wheelchair with daughter present. Continue with current plan of care.      Function:   Eating Eating   Modified Consistency Diet: No Eating Assist Level: Helper checks for pocketed food;Supervision or verbal cues;Helper feeds patient   Eating Set Up Assist For: Opening containers;Cutting food       Cognition Comprehension Comprehension assist level: Understands basic 50 - 74% of the time/ requires cueing 25 - 49% of the time  Expression   Expression assist level: Expresses basic 50 - 74% of the time/requires cueing 25 - 49% of the time. Needs to repeat parts of sentences.  Social Interaction Social Interaction assist level: Interacts appropriately less than 25% of the time. May be withdrawn or combative.  Problem Solving Problem solving assist level: Solves basic 25 - 49% of the  time - needs direction more than half the time to initiate, plan or complete simple activities  Memory Memory assist level: Recognizes or recalls less than 25% of the time/requires cueing greater than 75% of the time   Pain No reports of pain   Therapy/Group: Individual Therapy  Anthony Fisher 03/04/2017, 12:42 PM

## 2017-03-05 ENCOUNTER — Inpatient Hospital Stay (HOSPITAL_COMMUNITY): Payer: Self-pay | Admitting: Physical Therapy

## 2017-03-05 ENCOUNTER — Inpatient Hospital Stay (HOSPITAL_COMMUNITY): Payer: Self-pay | Admitting: Occupational Therapy

## 2017-03-05 ENCOUNTER — Inpatient Hospital Stay (HOSPITAL_COMMUNITY): Payer: Medicare Other | Admitting: Speech Pathology

## 2017-03-05 NOTE — Progress Notes (Signed)
Occupational Therapy Session Note  Patient Details  Name: Anthony Fisher MRN: [id number] Date of Birth: 1926-02-13  Today's Date: 03/05/2017 OT Individual Time: 3149-7026 OT Individual Time Calculation (min): 92 min    Short Term Goals: Week 2:  OT Short Term Goal 1 (Week 2): Pt will perform UB dressing with mod A in order to increase I with self care. OT Short Term Goal 2 (Week 2): Pt will perform toilet transfer with max A in order to decrease level of assist with functional transfer. OT Short Term Goal 3 (Week 2): Pt will demonstrate dynamic sitting balance of mod I for self care tasks sitting EOB  Skilled Therapeutic Interventions/Progress Updates: Skilled OT session completed with focus on left inattention, sequencing, L UE NMR, postural control, and sit<stands. Pt was lying in bed with daughter [name] present at time of arrival. LB ADLs completed bedlevel with max tactile cues to raise awareness to left side and to wash all body areas thoroughly. Utilized figure 4 position for him to initiate donning pants. UB ADLs completed at EOB with Min-Max A for balance, requiring multimodal cues for anterior weight shifting. He is able to grab wash cloth with L UE, requires assist proximally to integrate L UE functionally. Squat pivot<w/c completed with 2 helpers Total A. Oral care/grooming tasks completed w/c level at sink, L UE used as gross stabilizer. Sit<stands completed at sink with 2 helpers, left knee blocked and L UE WB on sink for 3 trials. Pt requires max multimodal cues for midline orientation due to strong left pushing tendencies. Longest standing time 1 min 30 seconds. Pt was then returned to w/c and safety belt was donned. He was reclined in TIS and left with daughter at time of departure.      Therapy Documentation Precautions:  Precautions Precautions: Fall Precaution Comments: L hemi; pushes L Restrictions Weight Bearing Restrictions: No    Pain: No c/o pain during session     ADL: ADL ADL Comments: refer to functional navigator :    See Function Navigator for Current Functional Status.   Therapy/Group: Individual Therapy  Destenie Ingber A Afiya Ferrebee 03/05/2017, 12:23 PM

## 2017-03-05 NOTE — Progress Notes (Signed)
Speech Language Pathology Daily Session Note  Patient Details  Name: Anthony Fisher MRN: [id number] Date of Birth: 03/02/1926  Today's Date: 03/05/2017 SLP Individual Time: 1030-1100 SLP Individual Time Calculation (min): 30 min  Short Term Goals: Week 2: SLP Short Term Goal 1 (Week 2): Pt will demonstrate sustained attention to familiar task for ~5 minutes with Min A verbal cues.  SLP Short Term Goal 2 (Week 2): Patient will utilize an increased vocal intensity to maximize speech intelligibility to ~90% at the phrase level with Min A verbal cues.  SLP Short Term Goal 3 (Week 2): Patient will consume current diet with minimal overt s/s of aspiration with Mod A verbal cues for use of swallowing compenstory strategies.  SLP Short Term Goal 4 (Week 2): Patient will consume trials of ice chips with overt s/s of aspiration in 25% of trials or less over 2 sessions with Mod A verbal cues to assess readiness for repeat MBS.  SLP Short Term Goal 5 (Week 2): Patient will demonstrate management of secretions and will initiate swallowing his saliva in 50% of opportunities with Mod A verbal cues.   Skilled Therapeutic Interventions: Skilled treatment session focused on dysphagia and speech goals. Upon arrival, patient was awake while upright in wheelchair and was receiving medications. SLP facilitated session by providing Max A verbal and tactile cues for patient to initiate swallowing of his salvia due to moderate left anterior spillage. Patient consumed nectar-thick liquids via straw without overt s/s of aspiration and demonstrated what appeared to be a more timely swallow initiation. Patient demonstrated emergent awareness of fatigue and was asking appropriate questions as to why "he can't stay awake like he used to." SLP provided education but suspect minimal carryover. Patient verbally described pictures at the phrase level and was ~50% intelligible with Mod verbal cues needed for use of an increased vocal  intensity. Patient left upright in wheelchair with all needs within reach. Continue with current plan of care.      Function:  Eating Eating   Modified Consistency Diet: No Eating Assist Level: Helper checks for pocketed food;Supervision or verbal cues   Eating Set Up Assist For: Opening containers Helper Scoops Food on Utensil: Occasionally     Cognition Comprehension Comprehension assist level: Understands basic 50 - 74% of the time/ requires cueing 25 - 49% of the time  Expression   Expression assist level: Expresses basic 50 - 74% of the time/requires cueing 25 - 49% of the time. Needs to repeat parts of sentences.  Social Interaction Social Interaction assist level: Interacts appropriately less than 25% of the time. May be withdrawn or combative.  Problem Solving Problem solving assist level: Solves basic 25 - 49% of the time - needs direction more than half the time to initiate, plan or complete simple activities  Memory Memory assist level: Recognizes or recalls 25 - 49% of the time/requires cueing 50 - 75% of the time    Pain No/Denies Pain   Therapy/Group: Individual Therapy  Malaky Tetrault 03/05/2017, 11:39 AM

## 2017-03-05 NOTE — Progress Notes (Signed)
[facility]IC[facility] & [facility]ILITATION     PROGRESS NOTE    Subjective/Complaints: No new issues. Had some loose stools yesterday---have since resolved  ROS: pt denies nausea, vomiting, diarrhea, cough, shortness of breath or chest pain      Objective: Vital Signs: Blood pressure 112/78, pulse 81, temperature 98.4 F (36.9 C), temperature source Oral, resp. rate 18, weight 80.6 kg (177 lb 12.8 oz), SpO2 95 %. No results found. No results for input(s): WBC, HGB, HCT, PLT in the last 72 hours. No results for input(s): NA, K, CL, GLUCOSE, BUN, CREATININE, CALCIUM in the last 72 hours.  Invalid input(s): CO CBG (last 3)  No results for input(s): GLUCAP in the last 72 hours.  Wt Readings from Last 3 Encounters:  03/05/17 80.6 kg (177 lb 12.8 oz)  02/23/17 85.7 kg (189 lb)    Physical Exam:  Constitutional: very alert.  HENT:  Head: Normocephalic and atraumatic.  Eyes: PERRL.  Neck: Normal range of motion. Neck supple.  Cardiovascular: RRR  . Respiratory: CTA B GI: Soft. Bowel sounds are normal. He exhibits no distension. There is no tenderness. There is no rebound.  Genitourinary:  Genitourinary-garment Musculoskeletal: He exhibits no edema or tenderness.  Neurological:  Very alert. Left inattention--but engaged left side with cues Left facial weakness. Dysarthria improving.   Oriented to self.  LUE 2+ to 3-/5. LLE 2-3-/5---varies depending upon level of engagement.  Decreased sense of pain left arm/leg    Skin: Skin is warm and dry.  Psychiatric:  Flat but a little more dynamic.   Assessment/Plan: 1. Functional and cognitive deficits secondary to right thalamic hemorrhage which require 3+ hours per day of interdisciplinary therapy in a comprehensive inpatient rehab setting. Physiatrist is providing close team supervision and 24 hour management of active medical problems listed below. Physiatrist and rehab team continue to assess barriers to discharge/monitor  patient progress toward functional and medical goals.  Function:  Bathing Bathing position Bathing activity did not occur: Safety/medical concerns (pt too lethargic to participate) Position: Wheelchair/chair at sink (UB only)  Bathing parts Body parts bathed by patient: Abdomen, Left arm, Chest Body parts bathed by helper: Right arm  Bathing assist Assist Level: Touching or steadying assistance(Pt > 75%)      Upper Body Dressing/Undressing Upper body dressing   What is the patient wearing?: Pull over shirt/dress     Pull over shirt/dress - Perfomed by patient: Thread/unthread right sleeve Pull over shirt/dress - Perfomed by helper: Thread/unthread left sleeve, Put head through opening, Pull shirt over trunk        Upper body assist Assist Level:  (max A)      Lower Body Dressing/Undressing Lower body dressing   What is the patient wearing?: Pants, Non-skid slipper socks       Pants- Performed by helper: Thread/unthread right pants leg, Thread/unthread left pants leg, Pull pants up/down   Non-skid slipper socks- Performed by helper: Don/doff right sock, Don/doff left sock                  Lower body assist Assist for lower body dressing: 2 Helpers      Toileting Toileting Toileting activity did not occur: Safety/medical concerns   Toileting steps completed by helper: Performs perineal hygiene, Adjust clothing prior to toileting, Adjust clothing after toileting    Toileting assist Assist level: Two helpers   Transfers Chair/bed transfer   Chair/bed transfer method: Squat pivot Chair/bed transfer assist level: Maximal assist (Pt 25 - 49%/lift  and lower) Chair/bed transfer assistive device: Mechanical lift Mechanical lift: Maximove   Locomotion Ambulation Ambulation activity did not occur: Safety/medical concerns   Max distance: 5 ft     Wheelchair Wheelchair activity did not occur: Safety/medical concerns     Assist Level: Dependent (Pt equals 0%)  (TIS)  Cognition Comprehension Comprehension assist level: Understands basic 50 - 74% of the time/ requires cueing 25 - 49% of the time  Expression Expression assist level: Expresses basic 50 - 74% of the time/requires cueing 25 - 49% of the time. Needs to repeat parts of sentences.  Social Interaction Social Interaction assist level: Interacts appropriately less than 25% of the time. May be withdrawn or combative.  Problem Solving Problem solving assist level: Solves basic 25 - 49% of the time - needs direction more than half the time to initiate, plan or complete simple activities  Memory Memory assist level: Recognizes or recalls 25 - 49% of the time/requires cueing 50 - 75% of the time   Medical Problem List and Plan: 1.  Functional deficits secondary to right thalamic hemorrhage             -continue CIR therapies  -improved arousal and engagement over last few days 2.  DVT Prophylaxis/Anticoagulation: Pharmaceutical: Lovenox 3. Pain Management: Tylenol prn.  4. Mood: LCSW to follow for evaluation and support when appropriate.  5. Neuropsych: This patient is not capable of making decisions on jisown behalf.  -sleep improving  -limit neurosedating meds as possible  -rx'ed UTI  -continue low dose ritalin 5mg  bid 6. Skin/Wound Care: routine pressure relief measures. Maintain adequate nutritional and hydration status.  7. Fluids/Electrolytes/Nutrition:   Encourage nectar thick liquids intake.   -eating and drinking better. No longer on IVF  -follow up BUN improved  -push fluids 8. Dementia: Monitor sleep wake cycle. Maintain consistent schedule.  9. HTN:  Remains somewhat labile -- On Norvasc daily with Prinivil bid--titrate as needed 10. Pre- renal azotemia:      -improved  .    11. Urinary retention:      -proteus and enterococcus UTI---changed to amoxil---continue for 7 days--completes today 3/30  -continue voiding trial, I/O cath prn 12. Anemia:  Hgb 12.9.  13. Dysphagia: On  dysphagia 1, nectar liquids with aspiration precautions.  14. Hyperglycemia: Hgb A1C- 5.5. Likely due to thickened juices for hydration.  15. Mild CHF due to Fluid overload: Monitor weights daily especially with IVF  -. Low salt diet.   -weight 80.6kg  16. Loose stool--self limited, resolved    LOS (Days) 10 A FACE TO FACE EVALUATION WAS PERFORMED  Alger Simons T, MD 03/05/2017 10:14 AM

## 2017-03-05 NOTE — Progress Notes (Signed)
Physical Therapy Session Note  Patient Details  Name: Anthony Fisher MRN: [id number] Date of Birth: 02-28-1926  Today's Date: 03/05/2017 PT Individual Time: 1300-1400 PT Individual Time Calculation (min): 60 min   Short Term Goals: Week 2:  PT Short Term Goal 1 (Week 2): Pt will consistently transfer w/c<>bed with max assist +1. PT Short Term Goal 2 (Week 2): Pt will maintain static sitting balance with min assist for 2 minutes.   Skilled Therapeutic Interventions/Progress Updates:    Pt resting in w/c on arrival, no indication of pain.  Session focus on attention to task, functional use of UEs, and transfers.  Pt changed shirt with max assist and max multimodal cues to sequence.  Squat/pivot from w/c to mat with total assist to facilitate forward weight shift and head/hips relationship.  Sitting balance edge of mat x5 minutes with overall supervision and verbal cues for upright posture and forward gaze.  Scooting L and R with total assist to facilitate head/hips relationship and forward weight shift.  Gait training with 3 muskateers with max multimodal cues and total assist x10'.  Standing frame x10 minutes with mirror activities and reaching activities focus on upright posture, R weight shift, and standing tolerance.  Pt returned to room for toileting at end of session, bed mobility for positioning of bed pan, hygiene, and clothing management with max assist.  Positioned to comfort with call bell in reach and needs met.   Therapy Documentation Precautions:  Precautions Precautions: Fall Precaution Comments: L hemi; pushes L Restrictions Weight Bearing Restrictions: No General: PT Amount of Missed Time (min): 15 Minutes PT Missed Treatment Reason: Patient fatigue   See Function Navigator for Current Functional Status.   Therapy/Group: Individual Therapy  [name] 03/05/2017, 5:04 PM

## 2017-03-06 ENCOUNTER — Inpatient Hospital Stay (HOSPITAL_COMMUNITY): Payer: Self-pay | Admitting: Occupational Therapy

## 2017-03-06 NOTE — Progress Notes (Signed)
[place] PHYSICAL MEDICINE & REHABILITATION     PROGRESS NOTE    Subjective/Complaints: No new issues per pt or per RN  ROS: pt denies nausea, vomiting, diarrhea, cough, shortness of breath or chest pain      Objective: Vital Signs: Blood pressure 129/65, pulse 91, temperature 98.4 F (36.9 C), temperature source Oral, resp. rate 18, weight 79.8 kg (176 lb), SpO2 95 %. No results found. No results for input(s): WBC, HGB, HCT, PLT in the last 72 hours. No results for input(s): NA, K, CL, GLUCOSE, BUN, CREATININE, CALCIUM in the last 72 hours.  Invalid input(s): CO CBG (last 3)  No results for input(s): GLUCAP in the last 72 hours.  Wt Readings from Last 3 Encounters:  03/06/17 79.8 kg (176 lb)  02/23/17 85.7 kg (189 lb)    Physical Exam:  Constitutional: very alert.  HENT:  Head: Normocephalic and atraumatic.  Eyes: PERRL.  Neck: Normal range of motion. Neck supple.  Cardiovascular: RRR  . Respiratory: CTA B GI: Soft. Bowel sounds are normal. He exhibits no distension. There is no tenderness. There is no rebound.  Genitourinary:  Genitourinary-garment Musculoskeletal: He exhibits no edema or tenderness.  Neurological:  Very alert. Left inattention--but engaged left side with cues Left facial weakness. Dysarthria improving.   Oriented to self.  LUE 2+ to 3-/5. LLE 2-3-/5---varies depending upon level of engagement.  Decreased sense of pain left arm/leg    Skin: Skin is warm and dry.  Psychiatric:  Flat but a little more dynamic.   Assessment/Plan: 1. Functional and cognitive deficits secondary to right thalamic hemorrhage which require 3+ hours per day of interdisciplinary therapy in a comprehensive inpatient rehab setting. Physiatrist is providing close team supervision and 24 hour management of active medical problems listed below. Physiatrist and rehab team continue to assess barriers to discharge/monitor patient progress toward functional and medical  goals.  Function:  Bathing Bathing position Bathing activity did not occur: Safety/medical concerns (pt too lethargic to participate) Position:  (LB bedlevel and UB at EOB)  Bathing parts Body parts bathed by patient: Left arm, Chest, Abdomen, Right upper leg, Left upper leg Body parts bathed by helper: Right arm, Buttocks, Right lower leg, Left lower leg, Front perineal area  Bathing assist Assist Level: 2 helpers      Upper Body Dressing/Undressing Upper body dressing   What is the patient wearing?: Pull over shirt/dress     Pull over shirt/dress - Perfomed by patient: Put head through opening Pull over shirt/dress - Perfomed by helper: Thread/unthread right sleeve, Thread/unthread left sleeve, Pull shirt over trunk        Upper body assist Assist Level:  (max A)      Lower Body Dressing/Undressing Lower body dressing   What is the patient wearing?: Pants, Non-skid slipper socks       Pants- Performed by helper: Thread/unthread right pants leg, Thread/unthread left pants leg, Pull pants up/down   Non-skid slipper socks- Performed by helper: Don/doff right sock, Don/doff left sock                  Lower body assist Assist for lower body dressing: 2 Helpers      Toileting Toileting Toileting activity did not occur: Safety/medical concerns   Toileting steps completed by helper: Performs perineal hygiene, Adjust clothing prior to toileting, Adjust clothing after toileting    Toileting assist Assist level: Two helpers   Transfers Chair/bed transfer   Chair/bed transfer method: Squat pivot Chair/bed transfer  assist level: 2 helpers Chair/bed transfer assistive device: Mechanical lift Mechanical lift: Maximove   Locomotion Ambulation Ambulation activity did not occur: Safety/medical concerns   Max distance: 5 ft     Wheelchair Wheelchair activity did not occur: Safety/medical concerns     Assist Level: Dependent (Pt equals 0%) (TIS)   Cognition Comprehension Comprehension assist level: Understands basic 50 - 74% of the time/ requires cueing 25 - 49% of the time  Expression Expression assist level: Expresses basic 50 - 74% of the time/requires cueing 25 - 49% of the time. Needs to repeat parts of sentences.  Social Interaction Social Interaction assist level: Interacts appropriately less than 25% of the time. May be withdrawn or combative.  Problem Solving Problem solving assist level: Solves basic 25 - 49% of the time - needs direction more than half the time to initiate, plan or complete simple activities  Memory Memory assist level: Recognizes or recalls 25 - 49% of the time/requires cueing 50 - 75% of the time   Medical Problem List and Plan: 1.  Functional deficits secondary to right thalamic hemorrhage             -continue CIR PT, OT, SLP  -improved arousal and engagement over last few days 2.  DVT Prophylaxis/Anticoagulation: Pharmaceutical: Lovenox 3. Pain Management: Tylenol prn.  4. Mood: LCSW to follow for evaluation and support when appropriate. Consider  neuropsych 5. Neuropsych: This patient is not capable of making decisions on his own behalf.  -sleep improving  -limit neurosedating meds as possible  -rx'ed UTI  -continue low dose ritalin 5mg  bid 6. Skin/Wound Care: routine pressure relief measures. Maintain adequate nutritional and hydration status.  7. Fluids/Electrolytes/Nutrition:   Encourage nectar thick liquids intake.   -eating and drinking better. No longer on IVF  -follow up BUN improved  -push fluids 8. Dementia: Monitor sleep wake cycle. Maintain consistent schedule.  9. HTN:  Remains somewhat labile -- On Norvasc daily with Prinivil bid--titrate as needed 10. Pre- renal azotemia:      -improved  .    11. Urinary retention:      -proteus and enterococcus UTI---changed to amoxil---continue for 7 days--completes  3/30  -continue voiding trial, I/O cath prn 12. Anemia:  Hgb 12.9.  13.  Dysphagia: On dysphagia 1, nectar liquids with aspiration precautions.  14. Hyperglycemia: Hgb A1C- 5.5. Likely due to thickened juices for hydration.  15. Mild CHF due to Fluid overload: Monitor weights daily especially with IVF- no SOB  -. Low salt diet.   -weight 80.6kg      LOS (Days) 11 A FACE TO FACE EVALUATION WAS PERFORMED  Charlett Blake, MD 03/06/2017 8:57 AM

## 2017-03-06 NOTE — Progress Notes (Signed)
Occupational Therapy Session Note  Patient Details  Name: Anthony Fisher MRN: [id number] Date of Birth: Apr 28, 1926  Today's Date: 03/06/2017 OT Individual Time: 1300-1357 OT Individual Time Calculation (min): 57 min    Short Term Goals: Week 1:  OT Short Term Goal 1 (Week 1): Pt will be able to sit at EOB with mod A to engage in self care. OT Short Term Goal 1 - Progress (Week 1): Not met OT Short Term Goal 2 (Week 1): Pt will be able to squat pivot to  BSC with max A of 1. OT Short Term Goal 2 - Progress (Week 1): Not met OT Short Term Goal 3 (Week 1): Pt will bathe UB with min A. OT Short Term Goal 3 - Progress (Week 1): Met OT Short Term Goal 4 (Week 1): Pt will don shirt with mod A. OT Short Term Goal 4 - Progress (Week 1): Not met Week 2:  OT Short Term Goal 1 (Week 2): Pt will perform UB dressing with mod A in order to increase I with self care. OT Short Term Goal 2 (Week 2): Pt will perform toilet transfer with max A in order to decrease level of assist with functional transfer. OT Short Term Goal 3 (Week 2): Pt will demonstrate dynamic sitting balance of mod I for self care tasks sitting EOB  Skilled Therapeutic Interventions/Progress Updates: Skilled OT session completed with focus on sitting balance, L UE NMR, and functional transfers. Pt was in [place] with nurse techs at time of arrival, preparing to return to bed for bedpan. Had pt transfer to [facility] for midline orientation/balance maintenance while voiding (with maximove and 2 helpers). Pt able to maintain upright balance with Min A-supervision with cuing. Cued him to go to the bathroom, and he was attempting to stand to walk to the toilet. Pt was unable to void, appeared unsure as to what to do while on commode and required max cuing to stay focused on voiding. Afterwards he was returned to w/c via stand pivot and 2 helpers. He was taken to gym and transferred to mat via squat pivot. At [facility], had him work on static sitting with  visual biofeedback, R UE in lap and L UE extended in weightbearing. L UE NMR completed with dynamic reaching laterally and diagonally, as well as laterally leaning onto elbows. Mod A for pt to return to sitting from left side. Worked on L UE strengthening with pt pushing therapists hand forward and bringing it backwards. Afterwards pt was returned to w/c in manner as written above. He was reclined in TIS and left with granddaughter at time of departure.      Therapy Documentation Precautions:  Precautions Precautions: Fall Precaution Comments: L hemi; pushes L Restrictions Weight Bearing Restrictions: No   Vital Signs: Therapy Vitals Temp: 98.4 F (36.9 C) Temp Source: Oral Pulse Rate: 92 Resp: 18 BP: 138/67 Patient Position (if appropriate): Sitting Oxygen Therapy SpO2: 97 % O2 Device: Not Delivered Pain: No c/o pain during session    ADL: ADL ADL Comments: refer to functional navigator     See Function Navigator for Current Functional Status.   Therapy/Group: Individual Therapy  Nohea Kras A Nylia Gavina 03/06/2017, 4:33 PM

## 2017-03-07 ENCOUNTER — Inpatient Hospital Stay (HOSPITAL_COMMUNITY): Payer: Medicare Other | Admitting: Physical Therapy

## 2017-03-07 NOTE — Progress Notes (Signed)
[place] PHYSICAL MEDICINE & REHABILITATION     PROGRESS NOTE    Subjective/Complaints:  States he has no pain , no new problems per pt ROS: pt denies nausea, vomiting, diarrhea, cough, shortness of breath or chest pain      Objective: Vital Signs: Blood pressure (!) 141/77, pulse 76, temperature 98.3 F (36.8 C), temperature source Axillary, resp. rate 18, weight 79.9 kg (176 lb 2.4 oz), SpO2 98 %. No results found. No results for input(s): WBC, HGB, HCT, PLT in the last 72 hours. No results for input(s): NA, K, CL, GLUCOSE, BUN, CREATININE, CALCIUM in the last 72 hours.  Invalid input(s): CO CBG (last 3)  No results for input(s): GLUCAP in the last 72 hours.  Wt Readings from Last 3 Encounters:  03/07/17 79.9 kg (176 lb 2.4 oz)  02/23/17 85.7 kg (189 lb)    Physical Exam:  Constitutional: very alert.  HENT:  Head: Normocephalic and atraumatic.  Eyes: PERRL.  Neck: Normal range of motion. Neck supple.  Cardiovascular: RRR  . Respiratory: CTA B GI: Soft. Bowel sounds are normal. He exhibits no distension. There is no tenderness. There is no rebound.  Genitourinary:  Genitourinary-garment Musculoskeletal: He exhibits no edema or tenderness.  Neurological:  Very alert. Left inattention--but engaged left side with cues Left facial weakness. Dysarthria improving.   Oriented to self.  LUE 2+ to 3-/5. LLE 2-3-/5---varies depending upon level of engagement.  Decreased sense of pain left arm/leg    Skin: Skin is warm and dry.  Psychiatric:  Flat but a little more dynamic.   Assessment/Plan: 1. Functional and cognitive deficits secondary to right thalamic hemorrhage which require 3+ hours per day of interdisciplinary therapy in a comprehensive inpatient rehab setting. Physiatrist is providing close team supervision and 24 hour management of active medical problems listed below. Physiatrist and rehab team continue to assess barriers to discharge/monitor patient progress  toward functional and medical goals.  Function:  Bathing Bathing position Bathing activity did not occur: Safety/medical concerns (pt too lethargic to participate) Position:  (LB bedlevel and UB at EOB)  Bathing parts Body parts bathed by patient: Left arm, Chest, Abdomen, Right upper leg, Left upper leg Body parts bathed by helper: Right arm, Buttocks, Right lower leg, Left lower leg, Front perineal area  Bathing assist Assist Level: 2 helpers      Upper Body Dressing/Undressing Upper body dressing   What is the patient wearing?: Pull over shirt/dress     Pull over shirt/dress - Perfomed by patient: Put head through opening Pull over shirt/dress - Perfomed by helper: Thread/unthread right sleeve, Thread/unthread left sleeve, Pull shirt over trunk        Upper body assist Assist Level:  (max A)      Lower Body Dressing/Undressing Lower body dressing   What is the patient wearing?: Pants, Non-skid slipper socks       Pants- Performed by helper: Thread/unthread right pants leg, Thread/unthread left pants leg, Pull pants up/down   Non-skid slipper socks- Performed by helper: Don/doff right sock, Don/doff left sock                  Lower body assist Assist for lower body dressing: 2 Helpers      Toileting Toileting Toileting activity did not occur: Safety/medical concerns   Toileting steps completed by helper: Performs perineal hygiene, Adjust clothing prior to toileting, Adjust clothing after toileting    Toileting assist Assist level: Two helpers   Transfers Chair/bed transfer  Chair/bed transfer method: Squat pivot, Stand pivot Chair/bed transfer assist level: 2 helpers Chair/bed transfer assistive device: Mechanical lift Mechanical lift: Maximove   Locomotion Ambulation Ambulation activity did not occur: Safety/medical concerns   Max distance: 5 ft     Wheelchair Wheelchair activity did not occur: Safety/medical concerns     Assist Level: Dependent  (Pt equals 0%) (TIS)  Cognition Comprehension Comprehension assist level: Understands basic 50 - 74% of the time/ requires cueing 25 - 49% of the time  Expression Expression assist level: Expresses basic 50 - 74% of the time/requires cueing 25 - 49% of the time. Needs to repeat parts of sentences.  Social Interaction Social Interaction assist level: Interacts appropriately less than 25% of the time. May be withdrawn or combative.  Problem Solving Problem solving assist level: Solves basic 25 - 49% of the time - needs direction more than half the time to initiate, plan or complete simple activities  Memory Memory assist level: Recognizes or recalls 25 - 49% of the time/requires cueing 50 - 75% of the time   Medical Problem List and Plan: 1.  Functional deficits secondary to right thalamic hemorrhage             -continue CIR PT, OT, SLP  -improved arousal and engagement over last few days 2.  DVT Prophylaxis/Anticoagulation: Pharmaceutical: Lovenox 3. Pain Management: Tylenol prn.  4. Mood: LCSW to follow for evaluation and support when appropriate. Consider  neuropsych 5. Neuropsych: This patient is not capable of making decisions on his own behalf.  -sleep improving  -limit neurosedating meds as possible  -rx'ed UTI  -continue low dose ritalin 5mg  bid 6. Skin/Wound Care: routine pressure relief measures. Maintain adequate nutritional and hydration status.  7. Fluids/Electrolytes/Nutrition:   Encourage nectar thick liquids intake.   -eating and drinking better. No longer on IVF  -follow up BUN improved  -push fluids 8. Dementia: Monitor sleep wake cycle. Maintain consistent schedule.  9. HTN:  Remains somewhat labile -- On Norvasc daily with Prinivil bid--titrate as needed 10. Pre- renal azotemia:      -improved  .    11. Urinary retention:    ICP this am 432ml   -continue voiding trial, I/O cath prn 12. Anemia:  Hgb 12.9.  13. Dysphagia: On dysphagia 1, nectar liquids with  aspiration precautions.  14. Hyperglycemia: Hgb A1C- 5.5. Likely due to thickened juices for hydration.  15. Mild CHF due to Fluid overload: Monitor weights daily especially with IVF- no SOB  -. Low salt diet.   -weight 80.6kg      LOS (Days) 12 A FACE TO FACE EVALUATION WAS PERFORMED  Charlett Blake, MD 03/07/2017 8:32 AM

## 2017-03-07 NOTE — Progress Notes (Addendum)
Occupational Therapy Session Note  Patient Details  Name: Anthony Fisher MRN: [id number] Date of Birth: March 05, 1926  Today's Date: 03/07/2017 OT Individual Time: 1337-1400 OT Individual Time Calculation (min): 23 min    Short Term Goals: Week 2:  OT Short Term Goal 1 (Week 2): Pt will perform UB dressing with mod A in order to increase I with self care. OT Short Term Goal 2 (Week 2): Pt will perform toilet transfer with max A in order to decrease level of assist with functional transfer. OT Short Term Goal 3 (Week 2): Pt will demonstrate dynamic sitting balance of mod I for self care tasks sitting EOB  Skilled Therapeutic Interventions/Progress Updates: Skilled OT session completed with focus on L UE NMR for makeup therapy time. Pt was lying in bed at time of arrival, agreeable to L UE exercises bedlevel with HOB elevated. Worked on A/AROM of L UE in gravity min/max positions. Able to tolerate resistance with bicep/triceps. Worked on grasp/release with therapist's hand but would benefit from trying this with activity/ADL objects. Cues for maintaining alertness provided throughout tx, cold wash cloth applied to face several times and had him reach/grasp with L UE to remove it. Pt left with daughter and all needs a time of departure.      Therapy Documentation Precautions:  Precautions Precautions: Fall Precaution Comments: L hemi; pushes L Restrictions Weight Bearing Restrictions: No General:   Vital Signs: Therapy Vitals Temp: 97.9 F (36.6 C) Temp Source: Oral Pulse Rate: 79 Resp: 17 BP: (!) 123/54 Patient Position (if appropriate): Lying Oxygen Therapy SpO2: 99 % O2 Device: Not Delivered Pain: No c/o pain during session    ADL: ADL ADL Comments: refer to functional navigator :    See Function Navigator for Current Functional Status.   Therapy/Group: Individual Therapy  Mann Skaggs A Itha Kroeker 03/07/2017, 4:10 PM

## 2017-03-07 NOTE — Progress Notes (Signed)
Physical Therapy Session Note  Patient Details  Name: Anthony Fisher MRN: [id number] Date of Birth: Aug 26, 1926  Today's Date: 03/07/2017 PT Individual Time: 1100-1140 PT Individual Time Calculation (min): 40 min   Short Term Goals: Week 1:  PT Short Term Goal 1 (Week 1): Pt will complete bed mobility with max assist. PT Short Term Goal 1 - Progress (Week 1): Met PT Short Term Goal 2 (Week 1): Pt will complete bed<>chair transfers with max assist. PT Short Term Goal 2 - Progress (Week 1): Progressing toward goal  Skilled Therapeutic Interventions/Progress Updates:  Pt was seen bedside in the am with daughter at bedside. Pt difficult to arouse and required constant verbal cues to maintain arousal to participate. Pt transferred supine to edge of bed with head of bed elevated and max A with verbal cues. Pt tolerated edge of bed about 5 minutes with min guard to mod A and verbal cues with UE support. Pt transferred edge of bed to supine with total A. Pt dependent to don pants and shirts. Pt transferred supine to edge of bed with max A and verbal cues with head of bed elevated. Pt transferred edge of bed to w/c with max A and verbal cues. Pt transported to rehab gym. In gym treatment focused on sit to stand in parallel bars and while standing worked on weight shifting and upright posture. Pt's stand tolerance about 15 to 30 seconds with mod to max A and verbal cues. Pt stood multiple times in parallel bars with max A and verbal cues. Pt returned to room secondary to fatigue. Pt stood in room from w/c with max to total A to place chair alarm. Pt left sitting up in w/c with daughter at bedside.   Therapy Documentation Precautions:  Precautions Precautions: Fall Precaution Comments: L hemi; pushes L Restrictions Weight Bearing Restrictions: No General: PT Amount of Missed Time (min): 20 Minutes PT Missed Treatment Reason: Patient fatigue Vital Signs:  Pain: No c/o pain.   See Function  Navigator for Current Functional Status.   Therapy/Group: Individual Therapy  Dub Amis 03/07/2017, 11:53 AM

## 2017-03-08 ENCOUNTER — Inpatient Hospital Stay (HOSPITAL_COMMUNITY): Payer: Self-pay

## 2017-03-08 ENCOUNTER — Inpatient Hospital Stay (HOSPITAL_COMMUNITY): Payer: Medicare Other | Admitting: Physical Therapy

## 2017-03-08 ENCOUNTER — Inpatient Hospital Stay (HOSPITAL_COMMUNITY): Payer: Medicare Other | Admitting: Speech Pathology

## 2017-03-08 ENCOUNTER — Inpatient Hospital Stay (HOSPITAL_COMMUNITY): Payer: Medicare Other | Admitting: Occupational Therapy

## 2017-03-08 MED ORDER — TAMSULOSIN HCL 0.4 MG PO CAPS
0.4000 mg | ORAL_CAPSULE | Freq: Every day | ORAL | Status: DC
  Administered 2017-03-08 – 2017-03-17 (×10): 0.4 mg via ORAL
  Filled 2017-03-08 (×10): qty 1

## 2017-03-08 NOTE — Plan of Care (Signed)
Problem: RH Eating Goal: LTG Patient will perform eating w/assist, cues/equip (OT) LTG: Patient will perform eating with assist, with/without cues using equipment (OT)  Downgraded secondary to pt progress  Problem: RH Grooming Goal: LTG Patient will perform grooming w/assist,cues/equip (OT) LTG: Patient will perform grooming with assist, with/without cues using equipment (OT)  Downgraded secondary to pt progress  Problem: RH Bathing Goal: LTG Patient will bathe with assist, cues/equipment (OT) LTG: Patient will bathe specified number of body parts with assist with/without cues using equipment (position)  (OT)  Downgraded secondary to pt progress  Problem: RH Dressing Goal: LTG Patient will perform lower body dressing w/assist (OT) LTG: Patient will perform lower body dressing with assist, with/without cues in positioning using equipment (OT)  Downgraded secondary to pt progress  Problem: RH Toileting Goal: LTG Patient will perform toileting w/assist, cues/equip (OT) LTG: Patient will perform toiletiing (clothes management/hygiene) with assist, with/without cues using equipment (OT)  Downgraded secondary to pt progress  Problem: RH Toilet Transfers Goal: LTG Patient will perform toilet transfers w/assist (OT) LTG: Patient will perform toilet transfers with assist, with/without cues using equipment (OT)  Downgraded secondary to pt progress  Problem: RH Tub/Shower Transfers Goal: LTG Patient will perform tub/shower transfers w/assist (OT) LTG: Patient will perform tub/shower transfers with assist, with/without cues using equipment (OT)  Downgraded secondary to pt progress  Problem: RH Balance Goal: LTG: Patient will maintain dynamic sitting balance (OT) LTG:  Patient will maintain dynamic sitting balance with assistance during activities of daily living (OT)  Downgraded secondary to pt progress  Problem: RH Balance Goal: LTG Patient will maintain dynamic standing with ADLs  (OT) LTG:  Patient will maintain dynamic standing balance with assist during activities of daily living (OT)   Downgraded secondary to pt progress

## 2017-03-08 NOTE — Progress Notes (Signed)
Speech Language Pathology Daily Session Note  Patient Details  Name: Anthony Fisher MRN: [id number] Date of Birth: [birth year]  Today's Date: 03/08/2017 SLP Individual Time: 1130-1200 SLP Individual Time Calculation (min): 30 min  Short Term Goals: Week 2: SLP Short Term Goal 1 (Week 2): Pt will demonstrate sustained attention to familiar task for ~5 minutes with Min A verbal cues.  SLP Short Term Goal 2 (Week 2): Patient will utilize an increased vocal intensity to maximize speech intelligibility to ~90% at the phrase level with Min A verbal cues.  SLP Short Term Goal 3 (Week 2): Patient will consume current diet with minimal overt s/s of aspiration with Mod A verbal cues for use of swallowing compenstory strategies.  SLP Short Term Goal 4 (Week 2): Patient will consume trials of ice chips with overt s/s of aspiration in 25% of trials or less over 2 sessions with Mod A verbal cues to assess readiness for repeat MBS.  SLP Short Term Goal 5 (Week 2): Patient will demonstrate management of secretions and will initiate swallowing his saliva in 50% of opportunities with Mod A verbal cues.   Skilled Therapeutic Interventions: Skilled treatment session focused on addressing speech goals. SLP facilitated session by providing a structured topic of patient's favorites and Max assist multimodal cues to increase vocal intensity for word-phrase level verbal expression.  Anterior loss of saliva also impacted speech and patient required Max verbal and visual cues to monitor and manage during this session.  Daughter present and reported that patient appeared more tired due to a busy morning.  Continue with current plan of care.    Function:  Cognition Comprehension Comprehension assist level: Understands basic 25 - 49% of the time/ requires cueing 50 - 75% of the time  Expression   Expression assist level: Expresses basic 25 - 49% of the time/requires cueing 50 - 75% of the time. Uses single words/gestures.   Social Interaction Social Interaction assist level: Interacts appropriately less than 25% of the time. May be withdrawn or combative.  Problem Solving Problem solving assist level: Solves basic 25 - 49% of the time - needs direction more than half the time to initiate, plan or complete simple activities  Memory Memory assist level: Recognizes or recalls 25 - 49% of the time/requires cueing 50 - 75% of the time    Pain Pain Assessment Pain Assessment: No/denies pain  Therapy/Group: Individual Therapy  Carmelia Roller., CCC-SLP [phone]  [place] 03/08/2017, 1:12 PM

## 2017-03-08 NOTE — Progress Notes (Signed)
Occupational Therapy Session Note  Patient Details  Name: Anthony Fisher MRN: [id number] Date of Birth: Oct 12, 1926  Today's Date: 03/08/2017 OT Individual Time: 0700-0759 OT Individual Time Calculation (min): 59 min    Short Term Goals: Week 2:  OT Short Term Goal 1 (Week 2): Pt will perform UB dressing with mod A in order to increase I with self care. OT Short Term Goal 2 (Week 2): Pt will perform toilet transfer with max A in order to decrease level of assist with functional transfer. OT Short Term Goal 3 (Week 2): Pt will demonstrate dynamic sitting balance of mod I for self care tasks sitting EOB  Skilled Therapeutic Interventions/Progress Updates:    Upon entering the room, pt supine in bed with no c/o pain this session. Pt with decreased alertness throughout session and needing max multimodal cues to attend to tasks. LB self care from bed level with pt able to wash peri area with set up A to obtain items. Pants donned from bed level with max A roll to L with use of bed rail and total A for roll to R. Total A for LB clothing management. Max A for supine >sit onto EOB. Pt transferred to R with total A for stand and stand pivot transfer into wheelchair. Propelling pt via wheelchair in hallway with total A but increasing pt's alertness with movement. Pt oriented to self only this session. Pt needing total A to orient to location, situation, and time. Pt propelled to RN station and quick release belt donned for safety. All needs within reach.   Therapy Documentation Precautions:  Precautions Precautions: Fall Precaution Comments: L hemi; pushes L Restrictions Weight Bearing Restrictions: No General:   Vital Signs: Oxygen Therapy SpO2: 95 % O2 Device: Not Delivered Pain: Pain Assessment Pain Assessment: No/denies pain ADL: ADL ADL Comments: refer to functional navigator Exercises:   Other Treatments:    See Function Navigator for Current Functional Status.   Therapy/Group:  Individual Therapy  Gypsy Decant 03/08/2017, 10:17 AM

## 2017-03-08 NOTE — Plan of Care (Signed)
Problem: RH BOWEL ELIMINATION Goal: RH STG MANAGE BOWEL WITH ASSISTANCE STG Manage Bowel with Mod Assistance.   Outcome: Not Progressing Incontinent of bowel  Problem: RH BLADDER ELIMINATION Goal: RH STG MANAGE BLADDER WITH EQUIPMENT WITH ASSISTANCE STG Manage Bladder With Equipment With Mod Assistance   Outcome: Not Progressing Total assist with I&O cath

## 2017-03-08 NOTE — Progress Notes (Signed)
with Physical Therapy Session Note  Patient Details  Name: Anthony Fisher MRN: [id number] Date of Birth: 06-Mar-1926  Today's Date: 03/08/2017 PT Individual Time: 2355-7322 and 0254-2706 PT Individual Time Calculation (min): 87 min and 10 min  Short Term Goals: Week 2:  PT Short Term Goal 1 (Week 2): Pt will consistently transfer w/c<>bed with max assist +1. PT Short Term Goal 2 (Week 2): Pt will maintain static sitting balance with min assist for 2 minutes.   Skilled Therapeutic Interventions/Progress Updates:  Treatment 1: Pt received in w/c & agreeable to tx. Pt's daughter [name]) present for entire session and actively participating. Transported pt to gym via w/c total assist. Provided pt with standard w/c with supportive back and pt able to maintain appropriate posture while sitting in new w/c. Throughout session pt performed stand pivot transfers w/c<>mat table with max assist but one instance of +2 assist 2/2 L knee buckling. Pt requires max multimodal cuing for sequencing and technique. Pt able to assist in the sit>stand portion of transfer. Pt tolerated sitting on EOM ~10 minutes with close supervision while engaging in task requiring him to attend to L and reach outside of BOS. Pt required mod cuing to attend to left but was able to correct posture and return to midline with close supervision<>min assist that eventually progressed to max assist 2/2 fatigue. Transitioned to standing frame for BLE weight bearing and neuro re-ed. Pt able to shift weight to R but unable to maintain this >5 seconds despite max multimodal cuing. Pt engaged in sorting task requiring moderate cuing to follow one step commands. Pt only able to tolerate standing frame for 5 minutes at a time x 2 trials. Pt reported need to use bathroom and was assisted back to room via w/c total assist. Pt completed sit<>stand transfers with max assist to allow for [facility] placement, pt with incontinent BM due to urgency. Pt completed BM on  toilet and then tolerated standing with max assist to allow 2nd person to complete peri hygiene total assist. After toileting, attempted to instruct pt in w/c propulsion with R hemi technique but pt unable to maintain proper foot placement and coordinate task. At end of session pt left sitting in w/c in room with QRB & chair alarm donned, all needs within reach, & daughter present to supervise.  Treatment 2: Pt received asleep in bed. Pt required max multimodal cues to increase alertness. Pt transferred supine>sitting EOB with total assist as pt unable to maintain grasp on bed rail to assist with pulling upright and pt with minimal participation in overall movement. Once sitting EOB therapist donned pt's shoes total assist and set up stedy lift to attempt bed>w/c transfer. Provided max multimodal cuing and instructions for sit>stand but pt unable to participate. Pt unable to hold head up to neutral positioning and unable to follow one step commands 2/2 lethargy. Assisted pt back to bed via total assist. Pt left in bed with all needs within reach & bed alarm set. Pt missed 20 minutes of skilled PT treatment 2/2 lethargy.   Therapy Documentation Precautions:  Precautions Precautions: Fall Precaution Comments: L hemi; pushes L Restrictions Weight Bearing Restrictions: No  General: PT Amount of Missed Time (min): 20 Minutes PT Missed Treatment Reason:  (lethargy)  Pain: No c/o pain noted.   See Function Navigator for Current Functional Status.   Therapy/Group: Individual Therapy  [name] 03/08/2017, 1:56 PM

## 2017-03-08 NOTE — Progress Notes (Signed)
[facility]IT[facility]     PROGRESS NOTE    Subjective/Complaints:  Pt denies any issues. No pain. RN reports he's unable to void.  ROS: pt denies nausea, vomiting, diarrhea, cough, shortness of breath or chest pain       Objective: Vital Signs: Blood pressure 127/60, pulse 82, temperature 97.7 F (36.5 C), temperature source Oral, resp. rate 17, weight 79.8 kg (175 lb 14.8 oz), SpO2 95 %. No results found. No results for input(s): WBC, HGB, HCT, PLT in the last 72 hours. No results for input(s): NA, K, CL, GLUCOSE, BUN, CREATININE, CALCIUM in the last 72 hours.  Invalid input(s): CO CBG (last 3)  No results for input(s): GLUCAP in the last 72 hours.  Wt Readings from Last 3 Encounters:  03/08/17 79.8 kg (175 lb 14.8 oz)  02/23/17 85.7 kg (189 lb)    Physical Exam:  Constitutional: very alert.  HENT:  Head: Normocephalic and atraumatic.  Eyes: PERRL.  Neck: Normal range of motion. Neck supple.  Cardiovascular: RRR  . Respiratory: CTA B GI: Soft. Bowel sounds are normal. He exhibits no distension. There is no tenderness. There is no rebound.  Genitourinary:  Genitourinary-garment Musculoskeletal: He exhibits no edema or tenderness.  Neurological:  Very alert. Left inattention--but engaged left side with cues Left facial weakness. Dysarthria improving.   Oriented to self.  LUE 2+ to 3-/5. LLE 2-3-/5---varies depending upon level of engagement.  Decreased sense of pain left arm/leg    Skin: Skin is warm and dry.  Psychiatric:  Flat but a little more dynamic.   Assessment/Plan: 1. Functional and cognitive deficits secondary to right thalamic hemorrhage which require 3+ hours per day of interdisciplinary therapy in a comprehensive inpatient rehab setting. Physiatrist is providing close team supervision and 24 hour management of active medical problems listed below. Physiatrist and rehab team continue to assess barriers to discharge/monitor  patient progress toward functional and medical goals.  Function:  Bathing Bathing position Bathing activity did not occur: Safety/medical concerns (pt too lethargic to participate) Position:  (LB bedlevel and UB at EOB)  Bathing parts Body parts bathed by patient: Left arm, Chest, Abdomen, Right upper leg, Left upper leg Body parts bathed by helper: Right arm, Buttocks, Right lower leg, Left lower leg, Front perineal area  Bathing assist Assist Level: 2 helpers      Upper Body Dressing/Undressing Upper body dressing   What is the patient wearing?: Pull over shirt/dress     Pull over shirt/dress - Perfomed by patient: Put head through opening Pull over shirt/dress - Perfomed by helper: Thread/unthread right sleeve, Thread/unthread left sleeve, Pull shirt over trunk        Upper body assist Assist Level:  (max A)      Lower Body Dressing/Undressing Lower body dressing   What is the patient wearing?: Pants, Non-skid slipper socks       Pants- Performed by helper: Thread/unthread right pants leg, Thread/unthread left pants leg, Pull pants up/down   Non-skid slipper socks- Performed by helper: Don/doff right sock, Don/doff left sock                  Lower body assist Assist for lower body dressing: 2 Helpers      Toileting Toileting Toileting activity did not occur: Safety/medical concerns   Toileting steps completed by helper: Performs perineal hygiene, Adjust clothing prior to toileting, Adjust clothing after toileting    Toileting assist Assist level: Two helpers   Transfers Chair/bed  transfer   Chair/bed transfer method: Stand pivot Chair/bed transfer assist level: Total assist (Pt < 25%) Chair/bed transfer assistive device: Mechanical lift Mechanical lift: Maximove   Locomotion Ambulation Ambulation activity did not occur: Safety/medical concerns   Max distance: 5 ft     Wheelchair Wheelchair activity did not occur: Safety/medical concerns      Assist Level: Dependent (Pt equals 0%) (TIS)  Cognition Comprehension Comprehension assist level: Understands basic 50 - 74% of the time/ requires cueing 25 - 49% of the time  Expression Expression assist level: Expresses basic 50 - 74% of the time/requires cueing 25 - 49% of the time. Needs to repeat parts of sentences.  Social Interaction Social Interaction assist level: Interacts appropriately 25 - 49% of time - Needs frequent redirection. ([place] evening)  Problem Solving Problem solving assist level: Solves basic 25 - 49% of the time - needs direction more than half the time to initiate, plan or complete simple activities  Memory Memory assist level: Recognizes or recalls 25 - 49% of the time/requires cueing 50 - 75% of the time   Medical Problem List and Plan: 1.  Functional deficits secondary to right thalamic hemorrhage             -continue CIR PT, OT, SLP  -improved arousal and engagement over last few days 2.  DVT Prophylaxis/Anticoagulation: Pharmaceutical: Lovenox 3. Pain Management: Tylenol prn.  4. Mood: LCSW to follow for evaluation and support when appropriate. Consider neuropsych 5. Neuropsych: This patient is not capable of making decisions on his own behalf.  -sleep improving  -limit neurosedating meds as possible  -rx'ed UTI  -continue low dose ritalin 5mg  bid 6. Skin/Wound Care: routine pressure relief measures. Maintain adequate nutritional and hydration status.  7. Fluids/Electrolytes/Nutrition:   Encourage nectar thick liquids intake.   -eating and drinking better. No longer on IVF   -push fluids 8. Dementia: Monitor sleep wake cycle. Maintain consistent schedule.  9. HTN:  Remains somewhat labile -- On Norvasc daily with Prinivil bid--titrate as needed 10. Pre- renal azotemia:      -improved  .    11. Urinary retention:    ICP this am 475ml   -continue voiding trial. I/O cath prn  -will add flomax  -up to toilet/commode to empty  -RN reports no  resistance with I/O caths (prostate hx) 12. Anemia:  Hgb 12.9.  13. Dysphagia: On dysphagia 1, nectar liquids with aspiration precautions.  14. Hyperglycemia: Hgb A1C- 5.5. Likely due to thickened juices for hydration.  15. Mild CHF due to Fluid overload: Monitor weights daily especially with IVF- no SOB  -. Low salt diet.   -weight 80.6kg      LOS (Days) 13 A FACE TO FACE EVALUATION WAS PERFORMED  Meredith Staggers, MD 03/08/2017 9:46 AM

## 2017-03-09 ENCOUNTER — Inpatient Hospital Stay (HOSPITAL_COMMUNITY): Payer: Medicare Other | Admitting: Physical Therapy

## 2017-03-09 ENCOUNTER — Inpatient Hospital Stay (HOSPITAL_COMMUNITY): Payer: Self-pay | Admitting: Occupational Therapy

## 2017-03-09 ENCOUNTER — Inpatient Hospital Stay (HOSPITAL_COMMUNITY): Payer: Medicare Other | Admitting: Speech Pathology

## 2017-03-09 ENCOUNTER — Inpatient Hospital Stay (HOSPITAL_COMMUNITY): Payer: Medicare Other | Admitting: Occupational Therapy

## 2017-03-09 LAB — URINALYSIS, ROUTINE W REFLEX MICROSCOPIC
BILIRUBIN URINE: NEGATIVE
Glucose, UA: NEGATIVE mg/dL
Hgb urine dipstick: NEGATIVE
KETONES UR: NEGATIVE mg/dL
Nitrite: NEGATIVE
Protein, ur: 30 mg/dL — AB
Specific Gravity, Urine: 1.02 (ref 1.005–1.030)
pH: 5 (ref 5.0–8.0)

## 2017-03-09 MED ORDER — METHYLPHENIDATE HCL 5 MG PO TABS
10.0000 mg | ORAL_TABLET | Freq: Two times a day (BID) | ORAL | Status: DC
  Administered 2017-03-09 – 2017-03-18 (×19): 10 mg via ORAL
  Filled 2017-03-09 (×19): qty 2

## 2017-03-09 NOTE — Progress Notes (Signed)
Speech Language Pathology Daily Session Note  Patient Details  Name: Anthony Fisher MRN: [id number] Date of Birth: [birth year]  Today's Date: 03/09/2017 SLP Individual Time: 1345-1425 SLP Individual Time Calculation (min): 40 min  Short Term Goals: Week 2: SLP Short Term Goal 1 (Week 2): Pt will demonstrate sustained attention to familiar task for ~5 minutes with Min A verbal cues.  SLP Short Term Goal 2 (Week 2): Patient will utilize an increased vocal intensity to maximize speech intelligibility to ~90% at the phrase level with Min A verbal cues.  SLP Short Term Goal 3 (Week 2): Patient will consume current diet with minimal overt s/s of aspiration with Mod A verbal cues for use of swallowing compenstory strategies.  SLP Short Term Goal 4 (Week 2): Patient will consume trials of ice chips with overt s/s of aspiration in 25% of trials or less over 2 sessions with Mod A verbal cues to assess readiness for repeat MBS.  SLP Short Term Goal 5 (Week 2): Patient will demonstrate management of secretions and will initiate swallowing his saliva in 50% of opportunities with Mod A verbal cues.   Skilled Therapeutic Interventions: Skilled treatment session focused on dysphagia and speech goals. SLP facilitated session by providing oral care via the suction toothbrush. Patient consumed trials of ice chips with Max A verbal and tactile cues needed for oral manipulation and swallow initiation. However, no overt s/s of aspiration were noted. Patient appeared lethargic throughout session and was perseverative on the time. Patient expressed basic wants/needs at the word and phrase level and was 100% intelligible with Mod I. Patient left semi-reclined in bed with alarm on and all needs within reach. Continue with current plan of care.      Function:  Eating Eating   Modified Consistency Diet: Yes Eating Assist Level: Set up assist for;Supervision or verbal cues;Helper checks for pocketed food   Eating Set Up  Assist For: Opening containers;Cutting food       Cognition Comprehension Comprehension assist level: Understands basic 25 - 49% of the time/ requires cueing 50 - 75% of the time  Expression   Expression assist level: Expresses basic 25 - 49% of the time/requires cueing 50 - 75% of the time. Uses single words/gestures.  Social Interaction Social Interaction assist level: Interacts appropriately less than 25% of the time. May be withdrawn or combative.  Problem Solving Problem solving assist level: Solves basic 25 - 49% of the time - needs direction more than half the time to initiate, plan or complete simple activities  Memory Memory assist level: Recognizes or recalls 25 - 49% of the time/requires cueing 50 - 75% of the time    Pain No/Denies Pain   Therapy/Group: Individual Therapy  [name] 03/09/2017, 3:40 PM

## 2017-03-09 NOTE — Progress Notes (Signed)
Physical Therapy Weekly Progress Note  Patient Details  Name: Anthony Fisher MRN: [id number] Date of Birth: 25-Feb-1926  Beginning of progress report period: March 03, 2017 End of progress report period: March 09, 2017  Today's Date: 03/09/2017  Patient has met 1 of 2 short term goals.  Pt is making slow progress towards LTG's as he continues to be limited by fatigue & lethargy during therapy sessions. Pt has poor initiation and awareness of all tasks, as well as poor awareness of body in space. Pt continues to require max/total assist (1-2 people) for bed mobility and transfers 2/2 cognitive and physical deficits. Pt's daughter has been present to observe most therapy sessions, but pt's son and daughter need to participate in hands-on caregiver training to ensure safe d/c for pt. Pt has initiated w/c mobility with R hemi technique and is able to complete with max assist 2/2 L inattention and poor coordination of task.  Have discussed with primary team and MD potential need to change pt's schedule to 15/7 if lethargy does not improve by end of week.   Patient continues to demonstrate the following deficits muscle weakness, decreased cardiorespiratoy endurance, decreased coordination, decreased visual perceptual skills, decreased attention to left, decreased initiation, decreased attention, decreased awareness, decreased problem solving, decreased safety awareness, decreased memory and delayed processing, and decreased sitting balance, decreased standing balance, decreased postural control, hemiplegia and decreased balance strategies and therefore will continue to benefit from skilled PT intervention to increase functional independence with mobility.  Pt's w/c mobility goals have been downgraded to min assist level. Depending on pt's progress over the next week, overall bed mobility & transfer goals may need to be downgraded to max assist overall.  PT Short Term Goals Week 2:  PT Short Term Goal 1  (Week 2): Pt will consistently transfer w/c<>bed with max assist +1. PT Short Term Goal 1 - Progress (Week 2): Progressing toward goal PT Short Term Goal 2 (Week 2): Pt will maintain static sitting balance with min assist for 2 minutes.  PT Short Term Goal 2 - Progress (Week 2): Met Week 3:  PT Short Term Goal 1 (Week 3): Pt will propel w/c 75 ft with mod assist. PT Short Term Goal 2 (Week 3): Pt will consistently transfer bed<>w/c with max assist +1.   Therapy Documentation Precautions:  Precautions Precautions: Fall Precaution Comments: L hemi; pushes L Restrictions Weight Bearing Restrictions: No   See Function Navigator for Current Functional Status.   [name] 03/09/2017, 3:59 PM

## 2017-03-09 NOTE — Progress Notes (Signed)
Physical Therapy Session Note  Patient Details  Name: Anthony Fisher MRN: [id number] Date of Birth: 19-Jul-1926  Today's Date: 03/09/2017 PT Individual Time: 5093-2671 and 1500-1530 PT Individual Time Calculation (min): 71 min and 30 min  Short Term Goals: Week 2:  PT Short Term Goal 1 (Week 2): Pt will consistently transfer w/c<>bed with max assist +1. PT Short Term Goal 2 (Week 2): Pt will maintain static sitting balance with min assist for 2 minutes.   Skilled Therapeutic Interventions/Progress Updates:  Treatment 1: Pt received in bed. Therapist provided total assist for donning pt's pants, socks, shoes, and shirt. Pt able to roll L with max assist but required total assist for rolling R to allow therapist to pull pants over hips. Pt transferred supine>sitting EOB with HOB raised and bed rails, and max/total assist. Pt transferred bed>w/c via stedy lift requiring max assist for sit>stand transfer. In gym pt instructed in w/c propulsion with R hemi technique. Therapist applied theraband to R wheel for increased tactile feedback. Pt continues to require max assist to propel w/c, for obstacle avoidance on left, and max cuing for coordination of RUE/RLE. Transitioned to using standing frame & pt able to tolerate for 7 minutes with max multimodal cuing & mirror for visual feedback. Pt continues to demonstrate L lateral lean and inability to hold head at neutral alignment for >30 seconds despite max cuing. Pt able to weight shift right but unable to sustain this for more than a few seconds at a time. Pt able to write name on mirror but then continued to perseverate on writing name despite cuing to cease task. Transitioned to [place] for use of dynavision from w/c level. Pt required mod cuing to attend to task, and only able to sustain attention for ~20 seconds maximum. Pt with decreased attention to L. At end of session pt left sitting in w/c in room with QRB & chair alarm donned & daughter present to  supervise. Pt continues to remain lethargic throughout session despite various attempts to increase alertness. Pt's daughter, [name], present to observe session.   Treatment 2: Pt received in bed being cleaned up by nursing staff. Therapist provided assistance with rolling L/R; pt able to roll L with mod assist and total assist for rolling R. Therapist donned pt's pants, socks, and shoes, total assist for time management. Pt transferred supine>sitting EOB with +2 assist and max multimodal cuing to initiate movement. Pt transferred bed>w/c via stand pivot with max assist +1. Transported pt to dayroom & focused on L attention & cognitive remediation (attention to task and problem solving). Pt assembled pipe tree shape (cross) with total assist (handing pt pieces) with max cuing for problem solving & connecting pieces together. Pt unable to identify picture of cross, at first stating it was a picture of a "bathroom". Pt then instructed to wipe saliva from mouth and instead wiped pipes. At end of session pt left sitting in w/c with QRB & chair alarm donned & at nurses station.   Therapy Documentation Precautions:  Precautions Precautions: Fall Precaution Comments: L hemi; pushes L Restrictions Weight Bearing Restrictions: No  Pain: Faces - no pain.   See Function Navigator for Current Functional Status.   Therapy/Group: Individual Therapy  [name] 03/09/2017, 3:46 PM

## 2017-03-09 NOTE — Progress Notes (Signed)
Occupational Therapy Session Note  Patient Details  Name: Anthony Fisher MRN: [id number] Date of Birth: 1926-02-23  Today's Date: 03/09/2017 OT Individual Time: 0935-1030 OT Individual Time Calculation (min): 55 min    Short Term Goals: Week 2:  OT Short Term Goal 1 (Week 2): Pt will perform UB dressing with mod A in order to increase I with self care. OT Short Term Goal 2 (Week 2): Pt will perform toilet transfer with max A in order to decrease level of assist with functional transfer. OT Short Term Goal 3 (Week 2): Pt will demonstrate dynamic sitting balance of mod I for self care tasks sitting EOB  Skilled Therapeutic Interventions/Progress Updates:    Treatment session with focus on dynamic sitting balance and Lt attention.  Pt received upright in w/c already dressed.  Completed squat pivot transfer to Rt to therapy mat with max assist.  Addressed trunk control while seated on elevated mat to eliminate use of BLE to maintain balance.  Initial supervision for sitting balance progressing to max assist by end of session due to fatigue.  Engaged in reaching to [place] to challenge trunk control and balance, requiring mod assist to correct balance when reaching outside BOS to Lt > Rt.  Returned to BLE support on floor while engaging in trunk control to obtain pegs on Lt side to place in pegboard in front of pt to incorporate Lt attention, trunk rotation, and anterior weight shift.  Pt required max cues to follow directions and mod-max assist for trunk control.  Provided 2 reclined rest breaks during session with noted improvement post each rest break, however returning to requiring mod-max assist when challenged.  Squat pivot to Lt with +2 assist back to w/c followed by max assist to Rt into bed for rest break per request from pt's daughter.  Therapy Documentation Precautions:  Precautions Precautions: Fall Precaution Comments: L hemi; pushes L Restrictions Weight Bearing Restrictions:  No Pain:  Pt with no c/o pain  See Function Navigator for Current Functional Status.   Therapy/Group: Individual Therapy  Simonne Come 03/09/2017, 10:37 AM

## 2017-03-09 NOTE — Plan of Care (Signed)
Problem: RH Wheelchair Mobility Goal: LTG Patient will propel w/c in controlled environment (PT) LTG: Patient will propel wheelchair in controlled environment, # of feet with assist (PT)  100 ft; downgrade 2/2 slow progress Goal: LTG Patient will propel w/c in home environment (PT) LTG: Patient will propel wheelchair in home environment, # of feet with assistance (PT).  50 ft; downgrade 2/2 slow progress

## 2017-03-09 NOTE — Progress Notes (Signed)
[facility]IL[facility]     PROGRESS NOTE    Subjective/Complaints:  Up with therapy in w/c. No new complaints. Denies pain. Still not voiding  ROS: pt denies nausea, vomiting, diarrhea, cough, shortness of breath or chest pain       Objective: Vital Signs: Blood pressure 114/62, pulse 92, temperature 97.7 F (36.5 C), temperature source Oral, resp. rate 16, weight 80 kg (176 lb 5.9 oz), SpO2 95 %. No results found. No results for input(s): WBC, HGB, HCT, PLT in the last 72 hours. No results for input(s): NA, K, CL, GLUCOSE, BUN, CREATININE, CALCIUM in the last 72 hours.  Invalid input(s): CO CBG (last 3)  No results for input(s): GLUCAP in the last 72 hours.  Wt Readings from Last 3 Encounters:  03/09/17 80 kg (176 lb 5.9 oz)  02/23/17 85.7 kg (189 lb)    Physical Exam:  Constitutional: very alert.  HENT:  Head: Normocephalic and atraumatic.  Eyes: PERRL.  Neck: Normal range of motion. Neck supple.  Cardiovascular: RRR  . Respiratory: CTA B GI: Soft. Bowel sounds are normal. He exhibits no distension. There is no tenderness. There is no rebound.  Genitourinary:  Genitourinary-garment Musculoskeletal: He exhibits no edema or tenderness.  Neurological:  Very alert. Left inattention--but engaged left side with cues Left facial weakness. Dysarthria improving.   Oriented to self.  LUE 2+ to 3-/5. LLE 2-3-/5---varies depending upon level of engagement.  Decreased sense of pain left arm/leg persistent   Skin: Skin is warm and dry.  Psychiatric:  Flat but a little more dynamic.   Assessment/Plan: 1. Functional and cognitive deficits secondary to right thalamic hemorrhage which require 3+ hours per day of interdisciplinary therapy in a comprehensive inpatient rehab setting. Physiatrist is providing close team supervision and 24 hour management of active medical problems listed below. Physiatrist and rehab team continue to assess barriers to  discharge/monitor patient progress toward functional and medical goals.  Function:  Bathing Bathing position Bathing activity did not occur: Safety/medical concerns (pt too lethargic to participate) Position: Bed (1/2 parts)  Bathing parts Body parts bathed by patient: Front perineal area Body parts bathed by helper: Buttocks  Bathing assist Assist Level:  (mod A 1/2 parts)      Upper Body Dressing/Undressing Upper body dressing   What is the patient wearing?: Pull over shirt/dress     Pull over shirt/dress - Perfomed by patient: Put head through opening Pull over shirt/dress - Perfomed by helper: Thread/unthread right sleeve, Thread/unthread left sleeve, Pull shirt over trunk        Upper body assist Assist Level:  (max A)      Lower Body Dressing/Undressing Lower body dressing   What is the patient wearing?: Pants, Socks, Shoes       Pants- Performed by helper: Thread/unthread right pants leg, Thread/unthread left pants leg, Pull pants up/down   Non-skid slipper socks- Performed by helper: Don/doff right sock, Don/doff left sock   Socks - Performed by helper: Don/doff right sock, Don/doff left sock   Shoes - Performed by helper: Don/doff right shoe, Don/doff left shoe, Fasten right, Fasten left          Lower body assist Assist for lower body dressing:  (total A)      Toileting Toileting Toileting activity did not occur: Safety/medical concerns   Toileting steps completed by helper: Performs perineal hygiene, Adjust clothing prior to toileting, Adjust clothing after toileting    Toileting assist Assist level: Two helpers  Transfers Chair/bed transfer   Chair/bed transfer method: Stand pivot Chair/bed transfer assist level: 2 helpers Chair/bed transfer assistive device: Facilities manager lift: Architectural technologist activity did not occur: Safety/medical concerns   Max distance: 5 ft     Wheelchair Wheelchair activity did  not occur: Safety/medical concerns Type: Manual Max wheelchair distance: 50 ft  Assist Level: Maximal assistance (Pt 25 - 49%)  Cognition Comprehension Comprehension assist level: Understands basic 25 - 49% of the time/ requires cueing 50 - 75% of the time  Expression Expression assist level: Expresses basic 25 - 49% of the time/requires cueing 50 - 75% of the time. Uses single words/gestures.  Social Interaction Social Interaction assist level: Interacts appropriately less than 25% of the time. May be withdrawn or combative.  Problem Solving Problem solving assist level: Solves basic 25 - 49% of the time - needs direction more than half the time to initiate, plan or complete simple activities  Memory Memory assist level: Recognizes or recalls 25 - 49% of the time/requires cueing 50 - 75% of the time   Medical Problem List and Plan: 1.  Functional deficits secondary to right thalamic hemorrhage             -continue CIR PT, OT, SLP  -team conference today 2.  DVT Prophylaxis/Anticoagulation: Pharmaceutical: Lovenox 3. Pain Management: Tylenol prn.  4. Mood: LCSW to follow for evaluation and support when appropriate.   5. Neuropsych: This patient is not capable of making decisions on his own behalf.  -sleep improving  -limit neurosedating meds as possible  -rx'ed UTI  -increase ritalin to 10mg  bid 6. Skin/Wound Care: routine pressure relief measures. Maintain adequate nutritional and hydration status.  7. Fluids/Electrolytes/Nutrition:   Encourage nectar thick liquids intake.   -eating and drinking better. No longer on IVF   -push fluids 8. Dementia: Monitor sleep wake cycle. Maintain consistent schedule.  9. HTN:  Remains somewhat labile -- On Norvasc daily with Prinivil bid--titrate as needed 10. Pre- renal azotemia:      -improved  .-rechek labs on thursday  11. Urinary retention:    ICP this am 414ml  -continue voiding trial. I/O cath prn  -continue flomax  -up to toilet/commode  to empty  -RN reports no resistance with I/O caths (prostate hx) 12. Anemia:  Hgb 12.9.  13. Dysphagia: On dysphagia 1, nectar liquids with aspiration precautions.  14. Hyperglycemia: Hgb A1C- 5.5. Likely due to thickened juices for hydration.  15. 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   -weight 80.6kg      LOS (Days) 14 A FACE TO FACE EVALUATION WAS PERFORMED  Meredith Staggers, MD 03/09/2017 10:19 AM

## 2017-03-09 NOTE — Progress Notes (Signed)
Occupational Therapy Session Note  Patient Details  Name: Anthony Fisher MRN: [id number] Date of Birth: 1925-12-20  Today's Date: 03/09/2017 OT Individual Time: 1740-8144 OT Individual Time Calculation (min): 25 min    Short Term Goals: Week 2:  OT Short Term Goal 1 (Week 2): Pt will perform UB dressing with mod A in order to increase I with self care. OT Short Term Goal 2 (Week 2): Pt will perform toilet transfer with max A in order to decrease level of assist with functional transfer. OT Short Term Goal 3 (Week 2): Pt will demonstrate dynamic sitting balance of mod I for self care tasks sitting EOB  Skilled Therapeutic Interventions/Progress Updates:    L NMR in min gravity position with focus on elbow flex/ext, forearm pronation/supination, and functional grasp/release. Joint input provided to bring pt through full ROM. Graded grasp activity beginning with lite and larger foam cup, then progressed to heavier smaller can of V8 juice. Pt able to grasp/release V8 can with Min A and multimodal cues to initiate grasp/release, then able to bend elbow to bring V8 can toward mouth with min elbow support to facilitate motion. Pt left semi-reclined in bed with needs met and family present.  Therapy Documentation Precautions:  Precautions Precautions: Fall Precaution Comments: L hemi; pushes L Restrictions Weight Bearing Restrictions: No Pain:  denies pain ADL: ADL ADL Comments: refer to functional navigator  See Function Navigator for Current Functional Status.   Therapy/Group: Individual Therapy  Valma Cava 03/09/2017, 1:27 PM

## 2017-03-10 ENCOUNTER — Inpatient Hospital Stay (HOSPITAL_COMMUNITY): Payer: Medicare Other | Admitting: Speech Pathology

## 2017-03-10 ENCOUNTER — Inpatient Hospital Stay (HOSPITAL_COMMUNITY): Payer: Self-pay | Admitting: Physical Therapy

## 2017-03-10 ENCOUNTER — Inpatient Hospital Stay (HOSPITAL_COMMUNITY): Payer: Self-pay | Admitting: Occupational Therapy

## 2017-03-10 LAB — URINE CULTURE: CULTURE: NO GROWTH

## 2017-03-10 MED ORDER — BETHANECHOL CHLORIDE 10 MG PO TABS
10.0000 mg | ORAL_TABLET | Freq: Three times a day (TID) | ORAL | Status: DC
  Administered 2017-03-10 – 2017-03-11 (×4): 10 mg via ORAL
  Filled 2017-03-10 (×4): qty 1

## 2017-03-10 NOTE — Progress Notes (Signed)
Occupational Therapy Session Note  Patient Details  Name: Anthony Fisher MRN: [id number] Date of Birth: Nov 11, 1926  Today's Date: 03/10/2017 OT Individual Time: 1300-1345 OT Individual Time Calculation (min): 45 min    Skilled Therapeutic Interventions/Progress Updates:    1:1 Therapeutic activity with focus on standing balance at midline in the standing frame with focus on weight shifts to the right with visual attention to the right and following one step commands.  Pt able to elongate trunk and initiate weight shift to the right with mod tactile cues. Pt with difficulty with rote tasks requiring mod cues every time. Pt required max cues for selective attention in min environment. Pt able to tolerate standing position for ~25 min with one seated rest break.  Mod A squat pivot to the right back to bed. At EOB doffed shoes with mod A and returned to supine with mod A. Pt fell right asleep in supine.  Clothing management with bridging with min A in prep for cathing.   Therapy Documentation Precautions:  Precautions Precautions: Fall Precaution Comments: L hemi; pushes L Restrictions Weight Bearing Restrictions: No Pain:  no c/o pain ADL: ADL ADL Comments: refer to functional navigator  See Function Navigator for Current Functional Status.   Therapy/Group: Individual Therapy  Willeen Cass [facility] 03/10/2017, 4:04 PM

## 2017-03-10 NOTE — Progress Notes (Signed)
Physical Therapy Session Note  Patient Details  Name: ZABIAN SWAYNE MRN: [id number] Date of Birth: 07-18-1926  Today's Date: 03/10/2017 PT Individual Time: 7414-2395 AND 1545-1620 PT Individual Time Calculation (min): 72 min AND 35 min   Short Term Goals: Week 3:  PT Short Term Goal 1 (Week 3): Pt will propel w/c 75 ft with mod assist. PT Short Term Goal 2 (Week 3): Pt will consistently transfer bed<>w/c with max assist +1.  Skilled Therapeutic Interventions/Progress Updates:   Bed mobility for dressing including roll L and R with max assist and bridge with mod assist for therapist to pull pants to waist. Supine>sit with max assist from PT and max cues for sequencing.    Sitting balance with min assist EOB to don shoes and don shirt. Pt able to correct posterior LOB with verbal cues from PT.   Transfer with max assist of 1 to [facility] pt unable to properly maintain UE placement through transfer requiring increaed help at end of transfer.   WC mobility 3 bouts of 161f, 774fand 5053fsing modified R hemi technique and max assist from PT pt varied from using RUE and RLE to just R LE. Increased success with only RLE, but decreased speed in increased effort noted. Approximation forces through the RLE to improved traction and success of use of LE    Standing balance in parallel bars as well as blocked practice sit<>stand. Max assist throughout and up to 25 seconds of standing tolerance in parallel bars before L lateral lean required return to seat. Noted improvement in L quad control.   Gait training at rail in hall x [id number] max assist +2 for WC follow. PT required to perform LLE limb advancement 90% of the time and stabilize the L knee to prevent buckling. Adequate weight shifting to allow LLE limb advancement throughout.   [facility] and [facility] needs met.     [place] received supine in bed and agreeable to PT.  Supine>sit transfer with max assist and max cues for sequncing.  Squat pivot transfer to L with max-total assist from PT mild activation in BLE, but no use pf BLE noted.   Gait training instructed by PT at rail in hal x 25 ft with max assist +2 for WC follow. Pt able to adance the LLE 50% of the time. PT required to block the LLE to prevent knee buckling and decreased effectiveness of weight shift compared to the AM treatment sessions.   [facility] and all needs met.           Therapy Documentation Precautions:  Precautions Precautions: Fall Precaution Comments: L hemi; pushes L Restrictions Weight Bearing Restrictions: No Pain: 0/10 at rest  See Function Navigator for Current Functional Status.   Therapy/Group: Individual Therapy  AustiLorie Phenix2018, 10:16 AM

## 2017-03-10 NOTE — Patient Care Conference (Signed)
Inpatient RehabilitationTeam Conference and Plan of Care Update Date: 03/09/2017   Time: 2:30 PM    Patient Name: Anthony Fisher      Medical Record Number: [id number]  Date of Birth: 07/25/1926 Sex: Male         Room/Bed: 4W17C/4W17C-01 Payor Info: Payor: [employer] MEDICARE / Plan: [facility] MEDICARE / Product Type: *No Product type* /    Admitting Diagnosis: R ICH  Admit Date/Time:  02/23/2017  8:02 PM Admission Comments: No comment available   Primary Diagnosis:  Thalamic hemorrhage (HCC) Principal Problem: Thalamic hemorrhage [facility])  Patient Active Problem List   Diagnosis Date Noted  . 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 IVH (intraventricular hemorrhage) ([place]) 02/23/2017  . Hypertensive emergency 02/23/2017  . Dysphagia due to recent stroke 02/23/2017  . Urinary retention 02/23/2017  . Dementia 02/23/2017  . Congestive heart failure ([place]) 02/23/2017  . Hypokalemia 02/23/2017  . Thalamic hemorrhage ([place]) 02/23/2017  . ICH (intracerebral hemorrhage) ([place]) - R thalamic hemorrhage 02/16/2017    Expected Discharge Date: Expected Discharge Date: 03/23/17  Team Members Present: Physician leading conference: Dr. Alger Simons Social Worker Present: Lennart Pall, LCSW Nurse Present: Heather Roberts, RN PT Present: Lavone Nian, PT OT Present: Willeen Cass, OT SLP Present: Weston Anna, SLP     Current Status/Progress Goal Weekly Team Focus  Medical   improved arousal. retaining urine---I/O caths. titrating ritalin to effect  improve functional mobility  bladder function, arousal/engagement   Bowel/Bladder   Incont of bowel. LBM 4-3. I&O cath q8 hrs. no voids  manage bowel mod assist. Bladder I&O cath total assist from caregiver.  asses bowel and bladder management. attempt uirnal prior to cath   Swallow/Nutrition/ Hydration   Dys. 1 textures with nectar-thick liquids, Max A   Min A with least restrictive diet   trials of upgraded textures/liquids, use of swallowing compensatory strategies    ADL's   UB max A, LB from bed level with total A, stand pivot with max A of 2 for safety, continues to have decreased alertness  min A overall  activity tolerance, sitting balance, functional transfers, standing balance, BADL retraining, family education   Mobility   max<>+2 assist overall  mod assist transfers, supervision w/c mobility  NMR, balance, transfers, strengthening, attention to task, cognitive remediation   Communication   Max A   Min A   speech intelligibility strategies, basic expression of wants/needs    Safety/Cognition/ Behavioral Observations  Mod-Max A for sustained attention  Min A  increased sustained attention, initiation    Pain   denies         Skin   scattered bruising. sacrum pink- blacnhable. MASD groin  free skin breakdown min assist  assess skin q shift    Rehab Goals Patient on target to meet rehab goals: Yes *See Care Plan and progress notes for long and short-term goals.  Barriers to Discharge: see prior, urine retention    Possible Resolutions to Barriers:  continue voiding trials, rx'ed uti, see prior    Discharge Planning/Teaching Needs:  Pt to d/c home with daughter and other family members providing 24/7, physical assistance.  Teaching to be ongoing.  Daughter here daily.   Team Discussion:  Better week;  Still not emptying bladder;  MD added slight increase ritalin dose.  Still a total assist overall, however, has begun working on w/c propulsiona dn is more alert and verbally expressive.  Hopeful he will continue to improve  but endurance is an issue.  Revisions to Treatment Plan:  None   Continued Need for Acute Rehabilitation Level of Care: The patient requires daily medical management by a physician with specialized training in physical medicine and rehabilitation for the following conditions: Daily direction of a multidisciplinary physical rehabilitation  program to ensure safe treatment while eliciting the highest outcome that is of practical value to the patient.: Yes Daily medical management of patient stability for increased activity during participation in an intensive rehabilitation regime.: Yes Daily analysis of laboratory values and/or radiology reports with any subsequent need for medication adjustment of medical intervention for : Neurological problems;Urological problems;Other  Renee Beale 03/10/2017, 2:02 PM

## 2017-03-10 NOTE — Progress Notes (Signed)
Speech Language Pathology Daily Session Note  Patient Details  Name: Anthony Fisher MRN: [id number] Date of Birth: 1926/09/13  Today's Date: 03/10/2017  Session 1: SLP Individual Time: 0830-0900 SLP Individual Time Calculation (min): 30 min   Session 2: SLP Individual Time: 1100-1130 SLP Individual Time Calculation (min): 30 min   Short Term Goals: Week 2: SLP Short Term Goal 1 (Week 2): Pt will demonstrate sustained attention to familiar task for ~5 minutes with Min A verbal cues.  SLP Short Term Goal 2 (Week 2): Patient will utilize an increased vocal intensity to maximize speech intelligibility to ~90% at the phrase level with Min A verbal cues.  SLP Short Term Goal 3 (Week 2): Patient will consume current diet with minimal overt s/s of aspiration with Mod A verbal cues for use of swallowing compenstory strategies.  SLP Short Term Goal 4 (Week 2): Patient will consume trials of ice chips with overt s/s of aspiration in 25% of trials or less over 2 sessions with Mod A verbal cues to assess readiness for repeat MBS.  SLP Short Term Goal 5 (Week 2): Patient will demonstrate management of secretions and will initiate swallowing his saliva in 50% of opportunities with Mod A verbal cues.   Skilled Therapeutic Interventions:  Session 1: Skilled treatment session focused on dysphagia goals. SLP facilitated session by providing skilled observation with breakfast meal of Dys. 1 textures with nectar-thick liquids. Patient consumed meal without overt s/s of aspiration and required Max A verbal cues for initiation of self-feeding due to fatigue and to self-monitor and correct left anterior spillage. Patient demonstrated decreased oral residue with meal, suspect due to increased thickeness of pureed textures. However, patient did not demonstrate any overt s/s of aspiration. Patient left sitting upright in bed with daughter present to complete supervision with meal. Continue with current plan of care.    Session 2: Skilled treatment session focused on dysphagia goals. Patient performed oral care via the suction toothbrush with Min A verbal cues for problem solving. Patient consumed trials of ice chips with overt coughing in 1/4 trials and trials of thin liquids via cup with delayed coughing in 50% of trials. Patient is not ready for a repeat MBS at this time, therefore, recommend patient continue nectar-thick liquids. Patient left upright in wheelchair with all needs within reach, quick release belt in place and daughter present. Continue with current plan of care.   Function:  Eating Eating   Modified Consistency Diet: Yes Eating Assist Level: Set up assist for;Supervision or verbal cues;Helper checks for pocketed food   Eating Set Up Assist For: Opening containers       Cognition Comprehension Comprehension assist level: Understands basic 25 - 49% of the time/ requires cueing 50 - 75% of the time  Expression   Expression assist level: Expresses basic 25 - 49% of the time/requires cueing 50 - 75% of the time. Uses single words/gestures.  Social Interaction Social Interaction assist level: Interacts appropriately less than 25% of the time. May be withdrawn or combative.  Problem Solving Problem solving assist level: Solves basic 25 - 49% of the time - needs direction more than half the time to initiate, plan or complete simple activities  Memory Memory assist level: Recognizes or recalls 25 - 49% of the time/requires cueing 50 - 75% of the time    Pain No reports of pain   Therapy/Group: Individual Therapy  Anvitha Hutmacher 03/10/2017, 2:37 PM

## 2017-03-10 NOTE — Progress Notes (Addendum)
[place] PHYSICAL MEDICINE & REHABILITATION     PROGRESS NOTE    Subjective/Complaints:  Still waking up. Daughter at bedside. Says he had a goo dnight.   ROS: Limited due cognitive/behavioral        Objective: Vital Signs: Blood pressure 125/68, pulse 97, temperature 97.8 F (36.6 C), temperature source Oral, resp. rate 18, weight 80 kg (176 lb 5.9 oz), SpO2 97 %. No results found. No results for input(s): WBC, HGB, HCT, PLT in the last 72 hours. No results for input(s): NA, K, CL, GLUCOSE, BUN, CREATININE, CALCIUM in the last 72 hours.  Invalid input(s): CO CBG (last 3)  No results for input(s): GLUCAP in the last 72 hours.  Wt Readings from Last 3 Encounters:  03/10/17 80 kg (176 lb 5.9 oz)  02/23/17 85.7 kg (189 lb)    Physical Exam:  Constitutional: a little slower to awaken today.  HENT:  Head: Normocephalic and atraumatic.  Eyes: PERRL.  Neck: Normal range of motion. Neck supple.  Cardiovascular: RRR  . Respiratory: CTA B GI: Soft. Bowel sounds are normal. He exhibits no distension. There is no tenderness. There is no rebound.  Genitourinary:  Genitourinary-garment Musculoskeletal: He exhibits no edema or tenderness.  Neurological:  Very alert. Left inattention--but engaged left side with cues Left facial weakness. Dysarthria improving.   Oriented to self.  LUE 2+ to 3-/5. LLE 2-3-/5---still varies depending upon level of engagement.  Left sided sensory deficits   Skin: Skin is warm and dry.  Psychiatric:  Flat but a little more dynamic.   Assessment/Plan: 1. Functional and cognitive deficits secondary to right thalamic hemorrhage which require 3+ hours per day of interdisciplinary therapy in a comprehensive inpatient rehab setting. Physiatrist is providing close team supervision and 24 hour management of active medical problems listed below. Physiatrist and rehab team continue to assess barriers to discharge/monitor patient progress toward functional  and medical goals.  Function:  Bathing Bathing position Bathing activity did not occur: Safety/medical concerns (pt too lethargic to participate) Position: Bed (1/2 parts)  Bathing parts Body parts bathed by patient: Front perineal area Body parts bathed by helper: Buttocks  Bathing assist Assist Level:  (mod A 1/2 parts)      Upper Body Dressing/Undressing Upper body dressing   What is the patient wearing?: Pull over shirt/dress     Pull over shirt/dress - Perfomed by patient: Put head through opening Pull over shirt/dress - Perfomed by helper: Thread/unthread right sleeve, Thread/unthread left sleeve, Pull shirt over trunk        Upper body assist Assist Level:  (max A)      Lower Body Dressing/Undressing Lower body dressing   What is the patient wearing?: Pants, Socks, Shoes       Pants- Performed by helper: Thread/unthread right pants leg, Thread/unthread left pants leg, Pull pants up/down   Non-skid slipper socks- Performed by helper: Don/doff right sock, Don/doff left sock   Socks - Performed by helper: Don/doff right sock, Don/doff left sock   Shoes - Performed by helper: Don/doff right shoe, Don/doff left shoe, Fasten right, Fasten left          Lower body assist Assist for lower body dressing:  (total A)      Toileting Toileting Toileting activity did not occur: Safety/medical concerns   Toileting steps completed by helper: Performs perineal hygiene, Adjust clothing prior to toileting, Adjust clothing after toileting    Toileting assist Assist level: Two helpers   Transfers Chair/bed transfer  Chair/bed transfer method: Stand pivot Chair/bed transfer assist level: Maximal assist (Pt 25 - 49%/lift and lower) Chair/bed transfer assistive device: Mechanical lift Mechanical lift: Stedy   Locomotion Ambulation Ambulation activity did not occur: Safety/medical concerns   Max distance: 5 ft     Wheelchair Wheelchair activity did not occur:  Safety/medical concerns Type: Manual Max wheelchair distance: 50 ft  Assist Level: Maximal assistance (Pt 25 - 49%)  Cognition Comprehension Comprehension assist level: Understands basic 25 - 49% of the time/ requires cueing 50 - 75% of the time  Expression Expression assist level: Expresses basic 25 - 49% of the time/requires cueing 50 - 75% of the time. Uses single words/gestures.  Social Interaction Social Interaction assist level: Interacts appropriately less than 25% of the time. May be withdrawn or combative.  Problem Solving Problem solving assist level: Solves basic 25 - 49% of the time - needs direction more than half the time to initiate, plan or complete simple activities  Memory Memory assist level: Recognizes or recalls 25 - 49% of the time/requires cueing 50 - 75% of the time   Medical Problem List and Plan: 1.  Functional deficits secondary to right thalamic hemorrhage             -continue CIR PT, OT, SLP  -spoke with daughter regarding patient's care needs given the severity of his deficits and age 83.  DVT Prophylaxis/Anticoagulation: Pharmaceutical: Lovenox 3. Pain Management: Tylenol prn.  4. Mood: LCSW to follow for evaluation and support when appropriate.   5. Neuropsych: This patient is not capable of making decisions on his own behalf.  -sleep improving  -limit neurosedating meds as possible  -rx'ed UTI  -increased ritalin to 10mg  bid 6. Skin/Wound Care: routine pressure relief measures. Maintain adequate nutritional and hydration status.  7. Fluids/Electrolytes/Nutrition:   Encourage nectar thick liquids intake.   -eating and drinking better.     -push fluids 8. Dementia: Monitor sleep wake cycle. Maintain consistent schedule.  9. HTN:  Remains somewhat labile -- On Norvasc daily with Prinivil bid--titrate as needed 10. Pre- renal azotemia:      -improved  .-rechek labs Tomorrow  11. Urinary retention:      -continue voiding trial. I/O cath prn  -continue  flomax  -add low dose urecholine today  -up to toilet/commode to empty  -RN reports no resistance with I/O caths (prostate hx)  -ua and ucx re-ordered. ua is suspicious---follow for culture results 12. Anemia:  Hgb 12.9.  13. Dysphagia: On dysphagia 1, nectar liquids with aspiration precautions.  14. Hyperglycemia: Hgb A1C- 5.5. Likely due to thickened juices for hydration.  15. 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   -weight stable at 80.6kg      LOS (Days) 15 A FACE TO FACE EVALUATION WAS PERFORMED  Meredith Staggers, MD 03/10/2017 9:17 AM

## 2017-03-11 ENCOUNTER — Inpatient Hospital Stay (HOSPITAL_COMMUNITY): Payer: Medicare Other | Admitting: Speech Pathology

## 2017-03-11 ENCOUNTER — Inpatient Hospital Stay (HOSPITAL_COMMUNITY): Payer: Medicare Other | Admitting: Occupational Therapy

## 2017-03-11 ENCOUNTER — Inpatient Hospital Stay (HOSPITAL_COMMUNITY): Payer: Self-pay | Admitting: Physical Therapy

## 2017-03-11 DIAGNOSIS — R339 Retention of urine, unspecified: Secondary | ICD-10-CM

## 2017-03-11 MED ORDER — BETHANECHOL CHLORIDE 25 MG PO TABS
25.0000 mg | ORAL_TABLET | Freq: Three times a day (TID) | ORAL | Status: DC
  Administered 2017-03-11 – 2017-03-15 (×12): 25 mg via ORAL
  Filled 2017-03-11 (×12): qty 1

## 2017-03-11 NOTE — Progress Notes (Signed)
[facility] PHYSICAL MEDICINE & REHABILITATION     PROGRESS NOTE    Subjective/Complaints:  Sitting in bed. No new issues. Denies pain or problems. Still not emptying bladder  ROS: pt denies nausea, vomiting, diarrhea, cough, shortness of breath or chest pain        Objective: Vital Signs: Blood pressure (!) 145/63, pulse 89, temperature 98.2 F (36.8 C), temperature source Oral, resp. rate 20, weight 78.9 kg (174 lb), SpO2 97 %. No results found. No results for input(s): WBC, HGB, HCT, PLT in the last 72 hours. No results for input(s): NA, K, CL, GLUCOSE, BUN, CREATININE, CALCIUM in the last 72 hours.  Invalid input(s): CO CBG (last 3)  No results for input(s): GLUCAP in the last 72 hours.  Wt Readings from Last 3 Encounters:  03/11/17 78.9 kg (174 lb)  02/23/17 85.7 kg (189 lb)    Physical Exam:  Constitutional: very alert.  HENT:  Head: Normocephalic and atraumatic.  Eyes: PERRL.  Neck: Normal range of motion. Neck supple.  Cardiovascular: RRR  . Respiratory: CTA B GI: Soft. Bowel sounds are normal. He exhibits no distension. There is no tenderness. There is no rebound.  Genitourinary:    Musculoskeletal: He exhibits no edema or tenderness.  Neurological:  Very alert. Engaging left side more easily. Speech clearer Left facial weakness. D Oriented to self.  LUE 2+ to 3-/5. LLE 2-3-/5---still varies depending upon level of engagement.  Left sided sensory deficits   Skin: Skin is warm and dry.  Psychiatric:  Flat but a little more dynamic.   Assessment/Plan: 1. Functional and cognitive deficits secondary to right thalamic hemorrhage which require 3+ hours per day of interdisciplinary therapy in a comprehensive inpatient rehab setting. Physiatrist is providing close team supervision and 24 hour management of active medical problems listed below. Physiatrist and rehab team continue to assess barriers to discharge/monitor patient progress toward functional and  medical goals.  Function:  Bathing Bathing position Bathing activity did not occur: Safety/medical concerns (pt too lethargic to participate) Position: Bed (1/2 parts)  Bathing parts Body parts bathed by patient: Front perineal area Body parts bathed by helper: Buttocks  Bathing assist Assist Level:  (mod A 1/2 parts)      Upper Body Dressing/Undressing Upper body dressing   What is the patient wearing?: Pull over shirt/dress     Pull over shirt/dress - Perfomed by patient: Put head through opening Pull over shirt/dress - Perfomed by helper: Thread/unthread right sleeve, Thread/unthread left sleeve, Pull shirt over trunk        Upper body assist Assist Level:  (max A)      Lower Body Dressing/Undressing Lower body dressing   What is the patient wearing?: Pants, Socks, Shoes       Pants- Performed by helper: Thread/unthread right pants leg, Thread/unthread left pants leg, Pull pants up/down   Non-skid slipper socks- Performed by helper: Don/doff right sock, Don/doff left sock   Socks - Performed by helper: Don/doff right sock, Don/doff left sock   Shoes - Performed by helper: Don/doff right shoe, Don/doff left shoe, Fasten right, Fasten left          Lower body assist Assist for lower body dressing:  (total A)      Toileting Toileting Toileting activity did not occur: Safety/medical concerns   Toileting steps completed by helper: Performs perineal hygiene, Adjust clothing prior to toileting, Adjust clothing after toileting    Toileting assist Assist level: Two helpers   Transfers Chair/bed transfer  Chair/bed transfer method: Squat pivot Chair/bed transfer assist level: Maximal assist (Pt 25 - 49%/lift and lower) Chair/bed transfer assistive device: Armrests Mechanical lift: Stedy   Locomotion Ambulation Ambulation activity did not occur: Safety/medical concerns   Max distance: 41ft Assist level: 2 helpers   Oceanographer activity did not  occur: Safety/medical concerns Type: Manual Max wheelchair distance: 142ft  Assist Level: Maximal assistance (Pt 25 - 49%)  Cognition Comprehension Comprehension assist level: Understands basic 25 - 49% of the time/ requires cueing 50 - 75% of the time  Expression Expression assist level: Expresses basic 25 - 49% of the time/requires cueing 50 - 75% of the time. Uses single words/gestures.  Social Interaction Social Interaction assist level: Interacts appropriately less than 25% of the time. May be withdrawn or combative.  Problem Solving Problem solving assist level: Solves basic 25 - 49% of the time - needs direction more than half the time to initiate, plan or complete simple activities  Memory Memory assist level: Recognizes or recalls 25 - 49% of the time/requires cueing 50 - 75% of the time   Medical Problem List and Plan: 1.  Functional deficits secondary to right thalamic hemorrhage             -continue CIR PT, OT, SLP  -spoke with daughter regarding patient's care needs given the severity of his deficits and age 83.  DVT Prophylaxis/Anticoagulation: Pharmaceutical: Lovenox 3. Pain Management: Tylenol prn.  4. Mood: LCSW to follow for evaluation and support when appropriate.   5. Neuropsych: This patient is not capable of making decisions on his own behalf.  -sleep improving  -limit neurosedating meds as possible  -rx'ed UTI  -increased ritalin to 10mg  bid 6. Skin/Wound Care: routine pressure relief measures. Maintain adequate nutritional and hydration status.  7. Fluids/Electrolytes/Nutrition:   Encourage nectar thick liquids intake.   -eating and drinking better.     -push fluids 8. Dementia: Monitor sleep wake cycle. Maintain consistent schedule.  9. HTN:  Remains somewhat labile -- On Norvasc daily with Prinivil bid--titrate as needed 10. Pre- renal azotemia:      -improved  .-rechek labs Friday  11. Urinary retention/hx of BPH:      -continue voiding trial. I/O cath  prn  -continue flomax  -increase urecholine to 25mg  TID  -up to toilet/commode to empty  -RN reports no resistance with I/O caths (prostate hx)  -ua and ucx re-ordered. ua is suspicious---but culture was NEGATIVE 12. Anemia:  Hgb 12.9.  13. Dysphagia: On dysphagia 1, nectar liquids with aspiration precautions.  14. Hyperglycemia: Hgb A1C- 5.5. Likely due to thickened juices for hydration.  15. 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   -weight stable at 80.kg      LOS (Days) 16 A FACE TO FACE EVALUATION WAS PERFORMED  Meredith Staggers, MD 03/11/2017 8:51 AM

## 2017-03-11 NOTE — Progress Notes (Signed)
Speech Language Pathology Weekly Progress and Session Note  Patient Details  Name: Anthony Fisher MRN: [id number] Date of Birth: Jan 01, 1926  Beginning of progress report period: March 04, 2017 End of progress report period: March 11, 2017  Today's Date: 03/11/2017 SLP Individual Time: 1300-1400 SLP Individual Time Calculation (min): 60 min  Short Term Goals: Week 2: SLP Short Term Goal 1 (Week 2): Pt will demonstrate sustained attention to familiar task for ~5 minutes with Min A verbal cues.  SLP Short Term Goal 1 - Progress (Week 2): Not met SLP Short Term Goal 2 (Week 2): Patient will utilize an increased vocal intensity to maximize speech intelligibility to ~90% at the phrase level with Min A verbal cues.  SLP Short Term Goal 2 - Progress (Week 2): Met SLP Short Term Goal 3 (Week 2): Patient will consume current diet with minimal overt s/s of aspiration with Mod A verbal cues for use of swallowing compenstory strategies.  SLP Short Term Goal 3 - Progress (Week 2): Not met SLP Short Term Goal 4 (Week 2): Patient will consume trials of ice chips with overt s/s of aspiration in 25% of trials or less over 2 sessions with Mod A verbal cues to assess readiness for repeat MBS.  SLP Short Term Goal 4 - Progress (Week 2): Not met SLP Short Term Goal 5 (Week 2): Patient will demonstrate management of secretions and will initiate swallowing his saliva in 50% of opportunities with Mod A verbal cues.  SLP Short Term Goal 5 - Progress (Week 2): Not met    New Short Term Goals: Week 3: SLP Short Term Goal 1 (Week 3): Pt will demonstrate sustained attention to familiar task for ~5 minutes with Min A verbal cues.  SLP Short Term Goal 2 (Week 3): Patient will utilize an increased vocal intensity to maximize speech intelligibility to ~90% at the phrase level with supervision verbal cues.  SLP Short Term Goal 3 (Week 3): Patient will consume current diet with minimal overt s/s of aspiration with Mod A  verbal cues for use of swallowing compenstory strategies.  SLP Short Term Goal 4 (Week 3): Patient will consume trials of ice chips with overt s/s of aspiration in 25% of trials or less over 2 sessions with Mod A verbal cues to assess readiness for repeat MBS.  SLP Short Term Goal 5 (Week 3): Patient will demonstrate management of secretions and will initiate swallowing his saliva in 50% of opportunities with Mod A verbal cues.   Weekly Progress Updates: Patient has made minimal gains and has met 1 of 5 STG's this reporting period. Currently, patient is consuming Dys. 1 textures with nectar-thick liquids via straw with minimal overt s/s of aspiration but requires Max-Total A for use of swallowing compensatory strategies. Patient continues to demonstrate severe oral residue during meals with decreased management of secretions, therefore, trials of upgraded textures have not been attempted. Trials of ice chips have been administered with minimal oral manipulation and intermittent verbal cues needed for swallow initiation and overt coughing in 75% of trials. Recommend patient continue current diet with full supervision and trials with SLP only. Patient demonstrates increased vocal intensity and is ~90% intelligible at the phrase level.  Patient also requires overall Max A verbal cues for sustained attention and initiation with functional tasks.  Patient and family education is ongoing. Patient would benefit from continued skilled SLP intervention to maximize his swallowing and cognitive function as well as his speech intelligibility in order to improve  his overall functional independence prior to discharge.      Intensity: Minumum of 1-2 x/day, 30 to 90 minutes Frequency: 3 to 5 out of 7 days Duration/Length of Stay: 4/17 Treatment/Interventions: Therapeutic Activities;Functional tasks;Dysphagia/aspiration precaution training;Cueing hierarchy;Speech/Language facilitation;Patient/family education;Cognitive  remediation/compensation;Internal/external aids;Environmental controls   Daily Session  Skilled Therapeutic Interventions: Skilled treatment session focused on dysphagia and cognitive goals. Upon arrival, patient was sitting upright in the wheelchair and appeared lethargic with severe anterior spillage of saliva. Patient consumed lunch tray of Dys. 1 textures with nectar-thick liquids with Mod A verbal cues needed for swallow initiation and Max A verbal cues needed to self-monitor and correct oral residue and left anterior spillage. Patient with overt coughing towards end of meal, suspect due to chunks of potato that were mixed in with his mashed potatoes. Patient with pocketing and oral expectoration of some chunks.  Oral care was provided at end of meal to clear oral residue. Patient required Max A verbal cues for sustained attention to task for ~1 minute and appeared restless throughout the session with constant fidgeting with chair. Patient also required Max-Total A for wheelchair propulsion. Patient left at RN station. Continue with current plan of care.       Function:   Eating Eating   Modified Consistency Diet: Yes Eating Assist Level: Set up assist for;Supervision or verbal cues;Helper checks for pocketed food   Eating Set Up Assist For: Opening containers       Cognition Comprehension Comprehension assist level: Understands basic 25 - 49% of the time/ requires cueing 50 - 75% of the time  Expression   Expression assist level: Expresses basic 25 - 49% of the time/requires cueing 50 - 75% of the time. Uses single words/gestures.  Social Interaction Social Interaction assist level: Interacts appropriately less than 25% of the time. May be withdrawn or combative.  Problem Solving Problem solving assist level: Solves basic 25 - 49% of the time - needs direction more than half the time to initiate, plan or complete simple activities  Memory Memory assist level: Recognizes or recalls 25 -  49% of the time/requires cueing 50 - 75% of the time   Pain No/Denies Pain   Therapy/Group: Individual Therapy  Gal Feldhaus, [place] 03/11/2017, 3:09 PM

## 2017-03-11 NOTE — Progress Notes (Signed)
Social Work Patient ID: Anthony Fisher, male   DOB: Jun 11, 1926, 83 y.o.   MRN: [id number]   Met with pt and daughter this morning to review team conference.  Aware and agreeable with continuing toward target d/c date of 4/17.  Discussed with daughter that next week we will focus  on "hands on" training with family.  Need to confirm any and all caregivers.  Continue to discuss with daughter.  Ghassan Coggeshall, LCSW

## 2017-03-11 NOTE — Plan of Care (Signed)
Problem: RH BLADDER ELIMINATION Goal: RH STG MANAGE BLADDER WITH EQUIPMENT WITH ASSISTANCE STG Manage Bladder With Equipment With Mod Assistance   Outcome: Not Progressing Total assist

## 2017-03-11 NOTE — Progress Notes (Signed)
Physical Therapy Session Note  Patient Details  Name: Anthony Fisher MRN: [id number] Date of Birth: [birth year]  Today's Date: 03/11/2017 PT Individual Time: 1400-1505 PT Individual Time Calculation (min): 65 min   Short Term Goals: Week 3:  PT Short Term Goal 1 (Week 3): Pt will propel w/c 75 ft with mod assist. PT Short Term Goal 2 (Week 3): Pt will consistently transfer bed<>w/c with max assist +1.  Skilled Therapeutic Interventions/Progress Updates: Pt received seated in w/c at nurses station; denies pain and agreeable to treatment. Pt initiating RUE propelling w/c with assist from therapist. Gait x25' with R rail and maxA and w/c follow. Requires max/totalA for LLE progression and stance control; repetitive verbal/tactile cues for upright posture, as well as visual target with people standing in front of him however unable to maintain upright >5 sec before returning to forward trunk flexion. Sit <>stand with R rail and maxA; RLE toe taps to 1" step for facilitation of LLE weight shifting and weight bearing, with max/totalA for knee control and continuous cues for upright posture as with gait. Sit <>stand with RUE overhead reaching to retrieve post-it notes to facilitate upright posture; repetitive multimodal cueing to attract attention to task and maintain standing position with pt frequently impulsively returning to sitting. Attempted sit <>stand at dynavision with RUE on hemi-walker for stability maxA +2; pt continually returns to sitting, indicates to therapist's that he is fatigued. Returned to room totalA in w/c. Stand pivot transfer "three muskateers" maxA +2. Sit >supine totalA. Attempted to engage pt in bridging to A with scooting to [facility] however minimal LE activation noted. Remained supine in bed, alarm intact at end of session, all needs in reach. Missed 25 min PT d/t fatigue.      Therapy Documentation Precautions:  Precautions Precautions: Fall Precaution Comments: L hemi; pushes  L Restrictions Weight Bearing Restrictions: No General: PT Amount of Missed Time (min): 25 Minutes PT Missed Treatment Reason: Patient fatigue   See Function Navigator for Current Functional Status.   Therapy/Group: Individual Therapy  Luberta Mutter 03/11/2017, 3:23 PM

## 2017-03-11 NOTE — Progress Notes (Signed)
Occupational Therapy Session Note  Patient Details  Name: Anthony Fisher MRN: [id number] Date of Birth: 09/15/1926  Today's Date: 03/11/2017 OT Individual Time: 0900-1000 OT Individual Time Calculation (min): 60 min    Short Term Goals: Week 2:  OT Short Term Goal 1 (Week 2): Pt will perform UB dressing with mod A in order to increase I with self care. OT Short Term Goal 2 (Week 2): Pt will perform toilet transfer with max A in order to decrease level of assist with functional transfer. OT Short Term Goal 3 (Week 2): Pt will demonstrate dynamic sitting balance of mod I for self care tasks sitting EOB  Skilled Therapeutic Interventions/Progress Updates:    Upon entering the room, pt in bed with daughter present in room. Pt bridging in bed with min A to hold B feet and OT pulling pants over B hips. Supine >sit with max A to EOB. Pt seated balance of min A - close supervision while therapist assisted pt in crossing ankle to opposite knee to don B socks and shoes. Pt needing assistance for majority of tasks but assisted with some parts utilizing R UE. Sit >stand with max A and L knee blocked for steps to transfer to wheelchair with overall max A. Pt seated in wheelchair at sink for grooming tasks with focus on visual scanning to the L for needed items, initiation, sequencing, and awareness of L UE for tasks. Pt needing max verbal cues for scans to L to locate items on sink needed for task. Pt required initial hand over hand assistance to wash B hands in sink. Pt standing at sink with L UE in weight bearing and L knee blocked. Pt standing with mod A for balance and brushing hair. Manual facilitation for upright posture. Pt returned to sit in wheelchair at end of session with quick release belt donned and call bell within reach. Daughter remains in room.   Therapy Documentation Precautions:  Precautions Precautions: Fall Precaution Comments: L hemi; pushes L Restrictions Weight Bearing Restrictions:  No General:   Vital Signs:  Pain:   ADL: ADL ADL Comments: refer to functional navigator Exercises:   Other Treatments:    See Function Navigator for Current Functional Status.   Therapy/Group: Individual Therapy  Gypsy Decant 03/11/2017, 12:43 PM

## 2017-03-12 ENCOUNTER — Inpatient Hospital Stay (HOSPITAL_COMMUNITY): Payer: Self-pay | Admitting: Occupational Therapy

## 2017-03-12 ENCOUNTER — Inpatient Hospital Stay (HOSPITAL_COMMUNITY): Payer: Medicare Other | Admitting: Speech Pathology

## 2017-03-12 ENCOUNTER — Inpatient Hospital Stay (HOSPITAL_COMMUNITY): Payer: Medicare Other | Admitting: Physical Therapy

## 2017-03-12 LAB — BASIC METABOLIC PANEL
ANION GAP: 11 (ref 5–15)
BUN: 33 mg/dL — ABNORMAL HIGH (ref 6–20)
CALCIUM: 9.7 mg/dL (ref 8.9–10.3)
CO2: 23 mmol/L (ref 22–32)
CREATININE: 1.18 mg/dL (ref 0.61–1.24)
Chloride: 102 mmol/L (ref 101–111)
GFR, EST NON AFRICAN AMERICAN: 53 mL/min — AB (ref >60)
Glucose, Bld: 109 mg/dL — ABNORMAL HIGH (ref 65–99)
Potassium: 4.4 mmol/L (ref 3.5–5.1)
SODIUM: 136 mmol/L (ref 135–145)

## 2017-03-12 LAB — CBC
HCT: 34.8 % — ABNORMAL LOW (ref 39.0–52.0)
HEMOGLOBIN: 11.9 g/dL — AB (ref 13.0–17.0)
MCH: 33.4 pg (ref 26.0–34.0)
MCHC: 34.2 g/dL (ref 30.0–36.0)
MCV: 97.8 fL (ref 78.0–100.0)
PLATELETS: 248 10*3/uL (ref 150–400)
RBC: 3.56 MIL/uL — AB (ref 4.22–5.81)
RDW: 13.2 % (ref 11.5–15.5)
WBC: 6.3 10*3/uL (ref 4.0–10.5)

## 2017-03-12 MED ORDER — SODIUM CHLORIDE 0.45 % IV SOLN
INTRAVENOUS | Status: DC
  Administered 2017-03-12 – 2017-03-13 (×2): via INTRAVENOUS
  Administered 2017-03-14: 1000 mL via INTRAVENOUS
  Administered 2017-03-15 – 2017-03-16 (×2): via INTRAVENOUS

## 2017-03-12 NOTE — Progress Notes (Signed)
[place] PHYSICAL MEDICINE & REHABILITATION     PROGRESS NOTE    Subjective/Complaints:  Sitting up in bed. No new complaints. States he feels tired. Denies any problems sleeping  ROS: Limited due cognitive/behavioral        Objective: Vital Signs: Blood pressure 134/74, pulse 75, temperature 98.6 F (37 C), temperature source Oral, resp. rate 18, weight 79.1 kg (174 lb 6.1 oz), SpO2 97 %. No results found.  Recent Labs  03/12/17 0648  WBC 6.3  HGB 11.9*  HCT 34.8*  PLT 248    Recent Labs  03/12/17 0648  NA 136  K 4.4  CL 102  GLUCOSE 109*  BUN 33*  CREATININE 1.18  CALCIUM 9.7   CBG (last 3)  No results for input(s): GLUCAP in the last 72 hours.  Wt Readings from Last 3 Encounters:  03/12/17 79.1 kg (174 lb 6.1 oz)  02/23/17 85.7 kg (189 lb)    Physical Exam:  Constitutional: very alert.  HENT:  Head: Normocephalic and atraumatic.  Eyes: PERRL.  Neck: Normal range of motion. Neck supple.  Cardiovascular: RRR Respiratory:CTA  GI: Soft. Bowel sounds are normal. He exhibits no distension. There is no tenderness. There is no rebound.  Genitourinary:    Musculoskeletal: He exhibits no edema or tenderness.  Neurological:  Very alert. Engaging left side more easily. Speech clearer Left facial weakness.  Oriented to self and hospital.  LUE 2+ to 3/5. LLE 2-3+/5---which can vary.  Left sided sensory deficits   Skin: Skin is warm and dry.  Psychiatric:  Remains flat   Assessment/Plan: 1. Functional and cognitive deficits secondary to right thalamic hemorrhage which require 3+ hours per day of interdisciplinary therapy in a comprehensive inpatient rehab setting. Physiatrist is providing close team supervision and 24 hour management of active medical problems listed below. Physiatrist and rehab team continue to assess barriers to discharge/monitor patient progress toward functional and medical goals.  Function:  Bathing Bathing position Bathing  activity did not occur: Safety/medical concerns (pt too lethargic to participate) Position: Bed (1/2 parts)  Bathing parts Body parts bathed by patient: Front perineal area Body parts bathed by helper: Buttocks  Bathing assist Assist Level:  (mod A 1/2 parts)      Upper Body Dressing/Undressing Upper body dressing   What is the patient wearing?: Pull over shirt/dress     Pull over shirt/dress - Perfomed by patient: Put head through opening Pull over shirt/dress - Perfomed by helper: Thread/unthread right sleeve, Thread/unthread left sleeve, Pull shirt over trunk        Upper body assist Assist Level:  (max A)      Lower Body Dressing/Undressing Lower body dressing   What is the patient wearing?: Pants, Socks, Shoes       Pants- Performed by helper: Thread/unthread right pants leg, Thread/unthread left pants leg, Pull pants up/down   Non-skid slipper socks- Performed by helper: Don/doff right sock, Don/doff left sock   Socks - Performed by helper: Don/doff right sock, Don/doff left sock   Shoes - Performed by helper: Don/doff right shoe, Don/doff left shoe, Fasten right, Fasten left          Lower body assist Assist for lower body dressing:  (total A)      Toileting Toileting Toileting activity did not occur: Safety/medical concerns   Toileting steps completed by helper: Performs perineal hygiene, Adjust clothing prior to toileting, Adjust clothing after toileting    Toileting assist Assist level: Two helpers   Transfers  Chair/bed transfer   Chair/bed transfer method: Stand pivot Chair/bed transfer assist level: 2 helpers Chair/bed transfer assistive device: Facilities manager lift: Maximove   Locomotion Ambulation Ambulation activity did not occur: Safety/medical concerns   Max distance: 58ft Assist level: 2 helpers   Oceanographer activity did not occur: Safety/medical concerns Type: Manual Max wheelchair distance: 146ft  Assist Level:  Maximal assistance (Pt 25 - 49%)  Cognition Comprehension Comprehension assist level: Understands basic 25 - 49% of the time/ requires cueing 50 - 75% of the time  Expression Expression assist level: Expresses basic 25 - 49% of the time/requires cueing 50 - 75% of the time. Uses single words/gestures.  Social Interaction Social Interaction assist level: Interacts appropriately less than 25% of the time. May be withdrawn or combative.  Problem Solving Problem solving assist level: Solves basic 25 - 49% of the time - needs direction more than half the time to initiate, plan or complete simple activities  Memory Memory assist level: Recognizes or recalls 25 - 49% of the time/requires cueing 50 - 75% of the time   Medical Problem List and Plan: 1.  Functional deficits secondary to right thalamic hemorrhage             -continue CIR PT, OT, SLP  -can decrease intensity of therapy to 15 hours over 7 days if therapy feels this is necessary 2.  DVT Prophylaxis/Anticoagulation: Pharmaceutical: Lovenox 3. Pain Management: Tylenol prn.  4. Mood: LCSW to follow for evaluation and support when appropriate.   5. Neuropsych: This patient is not capable of making decisions on his own behalf.  -sleep improving  -limit neurosedating meds as possible  -rx'ed UTI  -increased ritalin to 10mg  bid 6. Skin/Wound Care: routine pressure relief measures. Maintain adequate nutritional and hydration status.  7. Fluids/Electrolytes/Nutrition:   Encourage nectar thick liquids intake.   -BUN up to 33 again today     -will resume IVF at night   -recheck bmet on monday 8. Dementia: Monitor sleep wake cycle. Maintain consistent schedule.  9. HTN:  Remains somewhat labile -- On Norvasc daily with Prinivil bid--titrate as needed 10. Pre- renal azotemia:      -BUN elevated again today 33  11. Urinary retention/hx of BPH:  Voided incontinently yesterday  -continue voiding trial. I/O cath prn  -continue flomax  -increased  urecholine to 25mg  TID  -up to toilet/commode to empty/timed voids  -RN reports no resistance with I/O caths (prostate hx)  -ua and ucx re-ordered. ua is suspicious---but culture was NEGATIVE 12. Anemia:  Hgb 12.9.  13. Dysphagia: On dysphagia 1, nectar liquids with aspiration precautions.  14. Hyperglycemia: Hgb A1C- 5.5. Likely due to thickened juices for hydration.  15. 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   -weight stable at 79 kg      LOS (Days) 17 A FACE TO FACE EVALUATION WAS PERFORMED  Meredith Staggers, MD 03/12/2017 10:52 AM

## 2017-03-12 NOTE — Progress Notes (Signed)
Occupational Therapy Session Note  Patient Details  Name: Anthony Fisher MRN: [id number] Date of Birth: [birth year]  Today's Date: 03/12/2017 OT Individual Time: 4665-9935 and 1400-1435 OT Individual Time Calculation (min): 58 min and 35 min   Short Term Goals: Week 1:  OT Short Term Goal 1 (Week 1): Pt will be able to sit at EOB with mod A to engage in self care. OT Short Term Goal 1 - Progress (Week 1): Not met OT Short Term Goal 2 (Week 1): Pt will be able to squat pivot to  BSC with max A of 1. OT Short Term Goal 2 - Progress (Week 1): Not met OT Short Term Goal 3 (Week 1): Pt will bathe UB with min A. OT Short Term Goal 3 - Progress (Week 1): Met OT Short Term Goal 4 (Week 1): Pt will don shirt with mod A. OT Short Term Goal 4 - Progress (Week 1): Not met Week 2:  OT Short Term Goal 1 (Week 2): Pt will perform UB dressing with mod A in order to increase I with self care. OT Short Term Goal 2 (Week 2): Pt will perform toilet transfer with max A in order to decrease level of assist with functional transfer. OT Short Term Goal 3 (Week 2): Pt will demonstrate dynamic sitting balance of mod I for self care tasks sitting EOB Week 3:     Skilled Therapeutic Interventions/Progress Updates: Skilled OT session completed with focus on sitting balance, L UE NMR, alertness, and visual scanning. Pt was sitting in w/c at time of arrival. With [facility] assist for R UE and right leg rest removed, pt self propelled to doorway. He was then escorted to dayroom and participated in towel slides for proximal L UE strengthening. Pt able to complete this with AROM and therapist holding right hand. Afterwards he completed squat pivot<mat with Max A. At [facility], had pt 1) clap in beat to his favorite music for bilateral integration and maintaining sitting balance without UE support 2) complete AAROM  shoulder flexion; and 3) engage in tricep/bicep strengthening with pt pushed and pulled therapist's hand. Psychosocial  wellness enhanced as evidenced by pt scooting hips to dance and smiling while daughter took a video of him on her phone. Afterwards he returned to w/c, and engaged in visual scanning activities while looking outside of the window. He was escorted back to room and was left with safety belt, half lap tray, and daughter at time of departure.     2nd Session 1:1 tx (35 min) Pt received at [employer] station. He was escorted back to room to work on sit<stands. Pt required manual facilitation for anterior weight shift during power up and cues for R UE placement. Pt achieved squat-stand x4 trials with Max A, UEs weightbearing on therapist's shoulders. Pt with limited participation due to decreased arousal level. He required max instructional cues for technique and to maintain attention. Pt perseverating on wash cloth and faucet. 1 complete stand completed with Total A. Pt was then returned to RN station with half lap tray and safety belt donned.   Therapy Documentation Precautions:  Precautions Precautions: Fall Precaution Comments: L hemi; pushes L Restrictions Weight Bearing Restrictions: No   Pain: No c/o pain during sessions Pain Assessment Pain Assessment: No/denies pain ADL: ADL ADL Comments: refer to functional navigator:    See Function Navigator for Current Functional Status.   Therapy/Group: Individual Therapy  Anthony Fisher A Anthony Fisher 03/12/2017, 12:56 PM

## 2017-03-12 NOTE — Plan of Care (Signed)
Pt's plan of care adjusted to 15/7 after speaking with care team and discussed with MD in team conference as pt currently unable to tolerate therapy schedule with OT, PT, and SLP.

## 2017-03-12 NOTE — Progress Notes (Signed)
Speech Language Pathology Daily Session Note  Patient Details  Name: Anthony Fisher MRN: [id number] Date of Birth: [birth year]  Today's Date: 03/12/2017 SLP Individual Time: 2563-8937 SLP Individual Time Calculation (min): 60 min  Short Term Goals: Week 3: SLP Short Term Goal 1 (Week 3): Pt will demonstrate sustained attention to familiar task for ~5 minutes with Min A verbal cues.  SLP Short Term Goal 2 (Week 3): Patient will utilize an increased vocal intensity to maximize speech intelligibility to ~90% at the phrase level with supervision verbal cues.  SLP Short Term Goal 3 (Week 3): Patient will consume current diet with minimal overt s/s of aspiration with Mod A verbal cues for use of swallowing compenstory strategies.  SLP Short Term Goal 4 (Week 3): Patient will consume trials of ice chips with overt s/s of aspiration in 25% of trials or less over 2 sessions with Mod A verbal cues to assess readiness for repeat MBS.  SLP Short Term Goal 5 (Week 3): Patient will demonstrate management of secretions and will initiate swallowing his saliva in 50% of opportunities with Mod A verbal cues.   Skilled Therapeutic Interventions: Skilled treatment session focused on dysphagia and cognitive goals. Upon arrival, patient was supine in bed and required Max verbal and tactile cues for arousal. Patient consumed breakfast meal of Dys. 1 textures with nectar-thick liquids via straw with Max A verbal cues needed for initiation of self-feeding. Patient would self-feed ~2-3 bites independently and then would require cueing from clinician. Patient also required Max-Total A to self-monitor and correct oral residue and left anterior spillage. Patient also required Mod A verbal cues for swallow initiation which led to overt cough 1. Patient independently reported he needed to void but had already been incontinent. Patient followed 1 step commands during self-care tasks with Mod A verbal cues. Patient handed off to PT.  Continue with current plan of care.      Function:  Eating Eating   Modified Consistency Diet: Yes Eating Assist Level: Set up assist for;Supervision or verbal cues;Helper checks for pocketed food   Eating Set Up Assist For: Opening containers       Cognition Comprehension Comprehension assist level: Understands basic 25 - 49% of the time/ requires cueing 50 - 75% of the time  Expression   Expression assist level: Expresses basic 25 - 49% of the time/requires cueing 50 - 75% of the time. Uses single words/gestures.  Social Interaction Social Interaction assist level: Interacts appropriately less than 25% of the time. May be withdrawn or combative.  Problem Solving Problem solving assist level: Solves basic 25 - 49% of the time - needs direction more than half the time to initiate, plan or complete simple activities  Memory Memory assist level: Recognizes or recalls 25 - 49% of the time/requires cueing 50 - 75% of the time    Pain No/Denies Pain   Therapy/Group: Individual Therapy  Anthony Fisher 03/12/2017, 12:28 PM

## 2017-03-12 NOTE — Progress Notes (Signed)
Physical Therapy Session Note  Patient Details  Name: Anthony Fisher MRN: [id number] Date of Birth: [birth year]  Today's Date: 03/12/2017 PT Individual Time: 0915-1015 PT Individual Time Calculation (min): 60 min   Short Term Goals: Week 3:  PT Short Term Goal 1 (Week 3): Pt will propel w/c 75 ft with mod assist. PT Short Term Goal 2 (Week 3): Pt will consistently transfer bed<>w/c with max assist +1.  Skilled Therapeutic Interventions/Progress Updates:   Patient in bed upon arrival, handoff from SLP. Patient performed LB dressing in bed with total A for threading pants, able to bridge to pull pants over R hip with assist for L side. Transferred supine > sit with mod A to bring BLE off bed and patient elevating trunk with max verbal cues. Sitting EOB, donned shoes and socks with total A and pullover shirt with max A, max verbal cues for anterior weight shift due to posterior lean with min A for dynamic sitting balance. Sitting EOB, patient consumed 3 sips of nectar thick liquid via straw with swallow initiated 2/3 opportunities. Patient reached forward for cup with RUE to facilitate upright posture. Squat pivot transfer to wheelchair with mod A x 2. Sit <> stand from wheelchair with cues for RUE placement with LUE around therapist with max A to facilitate anterior weight shift and upright posture. Gait training using R rail in hallway x 30 ft with initial mod A faded to max A to advance/stabilize LLE with increased adductor tone noted as patient fatigued, max multimodal cues for upright posture and LE extension in stance phase with minimal carryover and +2 w/c follow. Progressed to gait using R hemiwalker x 15 ft with max A overall and max to total A to safely advance/stabilize LLE, max verbal/tactile cues for sequencing gait pattern and advancing HW and increased forward flexed posture and +2 w/c follow. Remainder of session focused on transfer training with daughter [name] from wheelchair to and from  mat table to L and R with demonstration of squat pivot and slide board transfer to decrease burden of care and emphasis on facilitation of head hips relationship, anterior weight shift, hand placement, body mechanics, and overall technique with patient requiring max to total A for squat pivot transfers and mod A slide board transfer to wheelchair to R. [name] demonstrated squat pivot transfer to L with total A and demonstrating safe technique. Patient demonstrates minimal to none buttock clearance with increased difficulty performing anterior weight shifts despite max-total A. Patient left sitting in wheelchair with 1/2 lap tray in place and needs in reach with daughter [name] in room.   Therapy Documentation Precautions:  Precautions Precautions: Fall Precaution Comments: L hemi; pushes L Restrictions Weight Bearing Restrictions: No Pain: Pain Assessment Pain Assessment: No/denies pain  See Function Navigator for Current Functional Status.   Therapy/Group: Individual Therapy  Nemesis Rainwater, [name] 03/12/2017, 12:38 PM

## 2017-03-13 ENCOUNTER — Inpatient Hospital Stay (HOSPITAL_COMMUNITY): Payer: Self-pay | Admitting: Occupational Therapy

## 2017-03-13 DIAGNOSIS — I10 Essential (primary) hypertension: Secondary | ICD-10-CM

## 2017-03-13 DIAGNOSIS — F0390 Unspecified dementia without behavioral disturbance: Secondary | ICD-10-CM

## 2017-03-13 DIAGNOSIS — R0989 Other specified symptoms and signs involving the circulatory and respiratory systems: Secondary | ICD-10-CM

## 2017-03-13 DIAGNOSIS — R7989 Other specified abnormal findings of blood chemistry: Secondary | ICD-10-CM

## 2017-03-13 NOTE — Progress Notes (Signed)
03/13/17 1800  What Happened  Was fall witnessed? No  Was patient injured? No  Patient found on floor  Found by Staff-comment  Stated prior activity to/from bed, chair, or stretcher  Follow Up  MD notified MD Posey Pronto   Time MD notified [phone]  Family notified (attempted)  Simple treatment Other (comment) (bandaid)  Progress note created (see row info) Yes  Adult Fall Risk Assessment  Risk Factor Category (scoring not indicated) High fall risk per protocol (document High fall risk)  Age 83  Fall History: Fall within 6 months prior to admission 5  Elimination; Bowel and/or Urine Incontinence 2  Elimination; Bowel and/or Urine Urgency/Frequency 2  Medications: includes PCA/Opiates, Anti-convulsants, Anti-hypertensives, Diuretics, Hypnotics, Laxatives, Sedatives, and Psychotropics 3  Patient Care Equipment 1  Mobility-Assistance 2  Mobility-Gait 2  Mobility-Sensory Deficit 2  Altered awareness of immediate physical environment 1  Impulsiveness 2  Lack of understanding of one's physical/cognitive limitations 4  Total Score 29  Patient's Fall Risk High Fall Risk (>13 points)  Adult Fall Risk Interventions  Required Bundle Interventions *See Row Information* High fall risk - low, moderate, and high requirements implemented  Vitals  Temp 98.2 F (36.8 C)  Temp Source Oral  BP (!) 152/66  BP Location Left Arm  BP Method Manual  Patient Position (if appropriate) Lying  Pulse Rate 99  Pulse Rate Source Dinamap  Resp 16  Oxygen Therapy  SpO2 98 %  O2 Device Room Air  Pain Assessment  Pain Assessment No/denies pain  Neurological  Neuro (WDL) X  Level of Consciousness Alert  Orientation Level Oriented to person;Oriented to place  Cognition Follows commands;Poor attention/concentration;Memory impairment;Poor safety awareness  Speech Slurred/Dysarthria  Pupil Assessment  No  RUE Motor Response Purposeful movement  RUE Sensation Full sensation  RUE Motor Strength 4  LUE Motor  Response Purposeful movement  LUE Sensation Decreased  LUE Motor Strength 2  RLE Motor Response Purposeful movement  RLE Sensation Full sensation  RLE Motor Strength 4  LLE Motor Response Purposeful movement  LLE Sensation Full sensation  LLE Motor Strength 2  Neuro Symptoms Forgetful;Anxiety  Musculoskeletal  Musculoskeletal (WDL) X  Assistive Device Wheelchair  Generalized Weakness Yes  Weight Bearing Restrictions No  Musculoskeletal Details  LUE Weakness;Limited movement  RLE Limited movement;Weakness  LLE Limited movement  Integumentary  Integumentary (WDL) X  Skin Color Appropriate for ethnicity  Skin Condition Dry  Skin Integrity Intact  Moisture Associated Skin Damage Location Scrotum  Moisture Associated Skin Damage Orientation Right;Left  Moisture Associated Skin Damage Intervention Other (Comment) (mcg powder)  Skin Turgor Non-tenting    Pt was in bed with siderails in place. Pt was found sitting next to bed on floor. Pt explained he was trying to get out of bed. Pt was put back to bed with maximove. Pt complains of no pain and vitals are stable. MD and family notifed. Pt in bed resting with bed alarm in place.

## 2017-03-13 NOTE — Progress Notes (Signed)
Occupational Therapy Session Note  Patient Details  Name: Anthony Fisher MRN: [id number] Date of Birth: Apr 12, 1926  Today's Date: 03/13/2017 OT Individual Time: 1101-1202 OT Individual Time Calculation (min): 61 min    Short Term Goals: Week 2:  OT Short Term Goal 1 (Week 2): Pt will perform UB dressing with mod A in order to increase I with self care. OT Short Term Goal 2 (Week 2): Pt will perform toilet transfer with max A in order to decrease level of assist with functional transfer. OT Short Term Goal 3 (Week 2): Pt will demonstrate dynamic sitting balance of mod I for self care tasks sitting EOB  Skilled Therapeutic Interventions/Progress Updates: Skilled OT session completed with focus on ADL retraining and cognitive skills. Pt was sitting in w/c with daughter at time of arrival, agreeable to session. Tx focus on donning/doffing shirt and socks. With max multimodal cues, pt able to assist in doffing shirt. Pt exhibiting visual/perceptual (?) deficits with threading L UE into wide open hole positioned at midline in lap. Pt unable to complete without assist, given significantly extra time to finish task himself. Step by step instruction provided in order for pt to don shirt with Mod A. Afterwards worked on figure 4 position to don/doff socks. Pt is able to maintain figure 4 without physical assist (for <1 minute with L LE). Bilateral UEs utilized for preparing gripper socks. Pt able to don R sock with extra time. Discussed family education with daughter [name] and possible d/c needs. [name] verbalizing desire to have hospital bed and drop arm commode. Plans made to relay information to SW. Pt was left with safety belt donned, half lap tray and daughter present at time of departure.      Therapy Documentation Precautions:  Precautions Precautions: Fall Precaution Comments: L hemi; pushes L Restrictions Weight Bearing Restrictions: No General:   Vital Signs: Therapy Vitals Temp: 98.4 F  (36.9 C) Temp Source: Oral Pulse Rate: 94 Resp: 17 BP: (!) 100/51 Patient Position (if appropriate): Lying Oxygen Therapy SpO2: 97 % O2 Device: Not Delivered Pain: No c/o pain during session   ADL: ADL ADL Comments: refer to functional navigator     See Function Navigator for Current Functional Status.   Therapy/Group: Individual Therapy  Abrey Bradway A Jasmynn Pfalzgraf 03/13/2017, 2:40 PM

## 2017-03-13 NOTE — Progress Notes (Signed)
[facility] PHYSICAL MEDICINE & REHABILITATION     PROGRESS NOTE    Subjective/Complaints: Pt seen laying in bed this AM.  No reported issues overnight.   ROS: Limited due cognitive/behavioral  Objective: Vital Signs: Blood pressure (!) 152/66, pulse 99, temperature 98.2 F (36.8 C), temperature source Oral, resp. rate 16, weight 78.7 kg (173 lb 9.6 oz), SpO2 98 %. No results found.  Recent Labs  03/12/17 0648  WBC 6.3  HGB 11.9*  HCT 34.8*  PLT 248    Recent Labs  03/12/17 0648  NA 136  K 4.4  CL 102  GLUCOSE 109*  BUN 33*  CREATININE 1.18  CALCIUM 9.7   CBG (last 3)  No results for input(s): GLUCAP in the last 72 hours.  Wt Readings from Last 3 Encounters:  03/13/17 78.7 kg (173 lb 9.6 oz)  02/23/17 85.7 kg (189 lb)    Physical Exam:  Constitutional: NAD. Vital signs reviewed.  HENT: Normocephalic and atraumatic.  Eyes: EOMI. No discharge.  Cardiovascular: RRR. No JVD. Respiratory: CTA. Unlabored.  GI: Soft. Bowel sounds are normal.  Musculoskeletal: He exhibits no edema or tenderness.  Neurological:   Speech clearer Left facial weakness.   LUE 2+ to 3/5 with ?increased tone LLE 2-3+/5 Skin: Skin is warm and dry.  Psychiatric:  Remains flat   Assessment/Plan: 1. Functional and cognitive deficits secondary to right thalamic hemorrhage which require 3+ hours per day of interdisciplinary therapy in a comprehensive inpatient rehab setting. Physiatrist is providing close team supervision and 24 hour management of active medical problems listed below. Physiatrist and rehab team continue to assess barriers to discharge/monitor patient progress toward functional and medical goals.  Function:  Bathing Bathing position Bathing activity did not occur: Safety/medical concerns (pt too lethargic to participate) Position: Bed (1/2 parts)  Bathing parts Body parts bathed by patient: Front perineal area Body parts bathed by helper: Buttocks  Bathing assist  Assist Level:  (mod A 1/2 parts)      Upper Body Dressing/Undressing Upper body dressing   What is the patient wearing?: Pull over shirt/dress     Pull over shirt/dress - Perfomed by patient: Put head through opening Pull over shirt/dress - Perfomed by helper: Thread/unthread right sleeve, Thread/unthread left sleeve, Pull shirt over trunk        Upper body assist Assist Level:  (max A)      Lower Body Dressing/Undressing Lower body dressing   What is the patient wearing?: Pants, Socks, Shoes       Pants- Performed by helper: Thread/unthread right pants leg, Thread/unthread left pants leg, Pull pants up/down   Non-skid slipper socks- Performed by helper: Don/doff right sock, Don/doff left sock   Socks - Performed by helper: Don/doff right sock, Don/doff left sock   Shoes - Performed by helper: Don/doff right shoe, Don/doff left shoe, Fasten right, Fasten left          Lower body assist Assist for lower body dressing:  (total A)      Toileting Toileting Toileting activity did not occur: Safety/medical concerns   Toileting steps completed by helper: Performs perineal hygiene, Adjust clothing prior to toileting, Adjust clothing after toileting    Toileting assist Assist level: Two helpers   Transfers Chair/bed transfer   Chair/bed transfer method: Squat pivot Chair/bed transfer assist level: Maximal assist (Pt 25 - 49%/lift and lower) Chair/bed transfer assistive device: Armrests Mechanical lift: Maximove   Locomotion Ambulation Ambulation activity did not occur: Safety/medical concerns   Max  distance: 43ft Assist level: 2 helpers   Oceanographer activity did not occur: Safety/medical concerns Type: Manual Max wheelchair distance: 158ft  Assist Level: Maximal assistance (Pt 25 - 49%)  Cognition Comprehension Comprehension assist level: Understands basic 25 - 49% of the time/ requires cueing 50 - 75% of the time  Expression Expression assist level:  Expresses basic 25 - 49% of the time/requires cueing 50 - 75% of the time. Uses single words/gestures.  Social Interaction Social Interaction assist level: Interacts appropriately less than 25% of the time. May be withdrawn or combative.  Problem Solving Problem solving assist level: Solves basic 25 - 49% of the time - needs direction more than half the time to initiate, plan or complete simple activities  Memory Memory assist level: Recognizes or recalls 25 - 49% of the time/requires cueing 50 - 75% of the time   Medical Problem List and Plan: 1.  Functional deficits secondary to right thalamic hemorrhage             -continue CIR  Records reviewed, images reviewed. 2.  DVT Prophylaxis/Anticoagulation: Pharmaceutical: Lovenox 3. Pain Management: Tylenol prn.  4. Mood: LCSW to follow for evaluation and support when appropriate.   5. Neuropsych: This patient is not capable of making decisions on his own behalf.  -sleep improving  -limit neurosedating meds as possible  -rx'ed UTI  -increased ritalin to 10mg  bid 6. Skin/Wound Care: routine pressure relief measures. Maintain adequate nutritional and hydration status.  7. Fluids/Electrolytes/Nutrition:   Encourage nectar thick liquids intake.   -BUN up to 33 4/6    -resumed IVF at night   -recheck bmet on monday 8. Dementia: Monitor sleep wake cycle. Maintain consistent schedule.  9. HTN:  Remains somewhat labile 4/7  Cont Norvasc daily with Prinivil bid--titrate as needed 10. Pre- renal azotemia:      -BUN elevated 33 on 4/6 11. Urinary retention/hx of BPH:    -continue voiding trial. I/O cath prn  -continue flomax  -increased urecholine to 25mg  TID  -up to toilet/commode to empty/timed voids  -RN reports no resistance with I/O caths (prostate hx)  -u culture was negative 12. Anemia:  Hgb 12.9. On 4/6 13. Dysphagia: On dysphagia 1, nectar liquids with aspiration precautions.  14. Hyperglycemia: Hgb A1C- 5.5. Likely due to thickened  juices for hydration.  15. 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   -weight stable at 79 kg   LOS (Days) 18 A FACE TO FACE EVALUATION WAS PERFORMED  Dock Baccam Lorie Phenix, MD 03/13/2017 7:48 PM

## 2017-03-14 ENCOUNTER — Inpatient Hospital Stay (HOSPITAL_COMMUNITY): Payer: Medicare Other | Admitting: Physical Therapy

## 2017-03-14 DIAGNOSIS — I5042 Chronic combined systolic (congestive) and diastolic (congestive) heart failure: Secondary | ICD-10-CM

## 2017-03-14 DIAGNOSIS — I509 Heart failure, unspecified: Secondary | ICD-10-CM

## 2017-03-14 DIAGNOSIS — R509 Fever, unspecified: Secondary | ICD-10-CM

## 2017-03-14 DIAGNOSIS — W19XXXA Unspecified fall, initial encounter: Secondary | ICD-10-CM

## 2017-03-14 NOTE — Progress Notes (Signed)
[facility] PHYSICAL MEDICINE & REHABILITATION     PROGRESS NOTE    Subjective/Complaints: Patient seen lying in bed this morning. No reported issues overnight. Patient did have a fall yesterday.  ROS: Ltd. due cognitive/behavioral  Objective: Vital Signs: Blood pressure 129/66, pulse 100, temperature (!) 100.4 F (38 C), temperature source Axillary, resp. rate 19, weight 80.6 kg (177 lb 11.2 oz), SpO2 99 %. No results found.  Recent Labs  03/12/17 0648  WBC 6.3  HGB 11.9*  HCT 34.8*  PLT 248    Recent Labs  03/12/17 0648  NA 136  K 4.4  CL 102  GLUCOSE 109*  BUN 33*  CREATININE 1.18  CALCIUM 9.7   CBG (last 3)  No results for input(s): GLUCAP in the last 72 hours.  Wt Readings from Last 3 Encounters:  03/14/17 80.6 kg (177 lb 11.2 oz)  02/23/17 85.7 kg (189 lb)    Physical Exam:  Constitutional: NAD. Vital signs reviewed.  HENT: Normocephalic and atraumatic.  Eyes: EOMI. No discharge.  Cardiovascular: RRR. No JVD. Respiratory: CTA. Unlabored.  GI: Soft. Bowel sounds are normal.  Musculoskeletal: He exhibits no edema or tenderness.  Neurological:   Speech clearer Left facial weakness.   LUE 3-/5 with ?increased tone LLE 3+/5 Skin: Mild abrasion to knee  Psychiatric:  Remains flat   Assessment/Plan: 1. Functional and cognitive deficits secondary to right thalamic hemorrhage which require 3+ hours per day of interdisciplinary therapy in a comprehensive inpatient rehab setting. Physiatrist is providing close team supervision and 24 hour management of active medical problems listed below. Physiatrist and rehab team continue to assess barriers to discharge/monitor patient progress toward functional and medical goals.  Function:  Bathing Bathing position Bathing activity did not occur: Safety/medical concerns (pt too lethargic to participate) Position: Bed (1/2 parts)  Bathing parts Body parts bathed by patient: Front perineal area Body parts bathed by  helper: Buttocks  Bathing assist Assist Level:  (mod A 1/2 parts)      Upper Body Dressing/Undressing Upper body dressing   What is the patient wearing?: Pull over shirt/dress     Pull over shirt/dress - Perfomed by patient: Put head through opening Pull over shirt/dress - Perfomed by helper: Thread/unthread right sleeve, Thread/unthread left sleeve, Pull shirt over trunk        Upper body assist Assist Level:  (max A)      Lower Body Dressing/Undressing Lower body dressing   What is the patient wearing?: Pants, Socks, Shoes       Pants- Performed by helper: Thread/unthread right pants leg, Thread/unthread left pants leg, Pull pants up/down   Non-skid slipper socks- Performed by helper: Don/doff right sock, Don/doff left sock   Socks - Performed by helper: Don/doff right sock, Don/doff left sock   Shoes - Performed by helper: Don/doff right shoe, Don/doff left shoe, Fasten right, Fasten left          Lower body assist Assist for lower body dressing:  (total A)      Toileting Toileting Toileting activity did not occur: Safety/medical concerns   Toileting steps completed by helper: Performs perineal hygiene, Adjust clothing prior to toileting, Adjust clothing after toileting    Toileting assist Assist level: Two helpers   Transfers Chair/bed transfer   Chair/bed transfer method: Squat pivot Chair/bed transfer assist level: Maximal assist (Pt 25 - 49%/lift and lower) Chair/bed transfer assistive device: Armrests Mechanical lift: Maximove   Locomotion Ambulation Ambulation activity did not occur: Safety/medical concerns  Max distance: 50ft Assist level: 2 helpers   Oceanographer activity did not occur: Safety/medical concerns Type: Manual Max wheelchair distance: 125ft  Assist Level: Maximal assistance (Pt 25 - 49%)  Cognition Comprehension Comprehension assist level: Understands basic 25 - 49% of the time/ requires cueing 50 - 75% of the time   Expression Expression assist level: Expresses basic 25 - 49% of the time/requires cueing 50 - 75% of the time. Uses single words/gestures.  Social Interaction Social Interaction assist level: Interacts appropriately less than 25% of the time. May be withdrawn or combative.  Problem Solving Problem solving assist level: Solves basic 25 - 49% of the time - needs direction more than half the time to initiate, plan or complete simple activities  Memory Memory assist level: Recognizes or recalls 25 - 49% of the time/requires cueing 50 - 75% of the time   Medical Problem List and Plan: 1.  Functional deficits secondary to right thalamic hemorrhage             -continue CIR 2.  DVT Prophylaxis/Anticoagulation: Pharmaceutical: Lovenox 3. Pain Management: Tylenol prn.  4. Mood: LCSW to follow for evaluation and support when appropriate.   5. Neuropsych: This patient is not capable of making decisions on his own behalf.  -sleep improving  -limit neurosedating meds as possible  -rx'ed UTI  -increased ritalin to 10mg  bid 6. Skin/Wound Care: routine pressure relief measures. Maintain adequate nutritional and hydration status.  7. Fluids/Electrolytes/Nutrition:   Encourage nectar thick liquids intake.   -BUN up to 33 4/6    -resumed IVF at night   -recheck bmet on monday 8. Dementia: Monitor sleep wake cycle. Maintain consistent schedule.  9. HTN:  Controlled 4/8  Cont Norvasc daily with Prinivil bid--titrate as needed 10. Pre- renal azotemia:      -BUN elevated 33 on 4/6 11. Urinary retention/hx of BPH:    -continue voiding trial. I/O cath prn  -continue flomax  -increased urecholine to 25mg  TID  -up to toilet/commode to empty/timed voids  -RN reports no resistance with I/O caths (prostate hx)  -u culture negative 12. Anemia:  Hgb 12.9. On 4/6 13. Dysphagia: On dysphagia 1, nectar liquids with aspiration precautions.  14. Hyperglycemia: Hgb A1C- 5.5. Likely due to thickened juices for  hydration.  15. 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   -weight stable 16. Fall   On 4/7  Mild abrasion to knee. No other trauma reported. Neurologically unchanged 17. Fever  Low grade, Isolated recording  No signs or symptoms of infection  Consider further workup if necessary  LOS (Days) 19 A FACE TO FACE EVALUATION WAS PERFORMED  Anthony [facility] Anthony Phenix, MD 03/14/2017 8:41 AM

## 2017-03-14 NOTE — Plan of Care (Signed)
Problem: RH SAFETY Goal: RH STG ADHERE TO SAFETY PRECAUTIONS W/ASSISTANCE/DEVICE STG Adhere to Safety Precautions With Mod Assistance/Device.   Outcome: Not Progressing Fall 03/13/17, getting OOB without assistance.

## 2017-03-14 NOTE — Progress Notes (Signed)
Physical Therapy Session Note  Patient Details  Name: Anthony Fisher MRN: [id number] Date of Birth: 1926/07/27  Today's Date: 03/14/2017 PT Individual Time: 4580-9983 PT Individual Time Calculation (min): 54 min   Short Term Goals: Week 3:  PT Short Term Goal 1 (Week 3): Pt will propel w/c 75 ft with mod assist. PT Short Term Goal 2 (Week 3): Pt will consistently transfer bed<>w/c with max assist +1.  Skilled Therapeutic Interventions/Progress Updates:  Pt received in bed with daughter [name]) & granddaughter present for session. Session focused on transfers and family education. Educated [name] on various transfer methods (squat pivot, slide board, and stedy lift); provided information on purchasing a stedy lift if [name] chooses to do so - she reports she will look on Internet for cheaper Stedy. Also discussed recommended DME and ordering of equipment prior to pt's d/c. [name] assisted pt with supine>sitting and bed<>w/c via squat pivot and slide board. [name] requires cuing for management of hospital bed, placement of w/c, and positioning of slide board. [name] completed transfers with +2 assist from therapist to assist with pivoting pt as pt with minimal participation in transfers. [name] demonstrates good body mechanics during all transfers and reports increased ease with use of slide board; [name] is also able to instruct pt in head/hips relationship to increase ease of transfers. Transported pt to rehab gym via w/c total assist and therapist assisted pt with squat pivot transfer w/c<>car set at simulated height; pt required total assist for transfer as pt with very little participation in movement. Pt unable to initiate moving BLE in/out of car. Pt's family will benefit from hands on practice with real car transfer prior to pt's d/c. At end of session pt left sitting in w/c in room with QRB & chair alarm donned, all needs within reach, & family present to supervise.   Pt continues to remain lethargic  throughout session, requiring max stimulation for increased alertness and max cuing to attend to and follow one step commands.  Therapy Documentation Precautions:  Precautions Precautions: Fall Precaution Comments: L hemi; pushes L Restrictions Weight Bearing Restrictions: No  General: PT Amount of Missed Time (min): 6 Minutes PT Missed Treatment Reason: Patient fatigue  Pain: Faces - no pain.   See Function Navigator for Current Functional Status.   Therapy/Group: Individual Therapy  [name] 03/14/2017, 12:06 PM

## 2017-03-14 NOTE — Plan of Care (Signed)
Problem: RH Balance Goal: LTG Patient will maintain dynamic standing balance (PT) LTG:  Patient will maintain dynamic standing balance with assistance during mobility activities (PT)  Downgrade 2/2 slow progress  Problem: RH Bed Mobility Goal: LTG Patient will perform bed mobility with assist (PT) LTG: Patient will perform bed mobility with assistance, with/without cues (PT).  Downgrade 2/2 slow progress  Problem: RH Car Transfers Goal: LTG Patient will perform car transfers with assist (PT) LTG: Patient will perform car transfers with assistance (PT).  Downgrade 2/2 slow progress  Problem: RH Ambulation Goal: LTG Patient will ambulate in controlled environment (PT) LTG: Patient will ambulate in a controlled environment, # of feet with assistance (PT).  15 ft with LRAD; downgrade 2/2 slow progress   Problem: RH Other (Specify) Goal: RH LTG Other (Specify)1 Outcome: Adequate for Discharge Family to install ramp.

## 2017-03-14 NOTE — Plan of Care (Signed)
Problem: RH Eating Goal: LTG Patient will perform eating w/assist, cues/equip (OT) LTG: Patient will perform eating with assist, with/without cues using equipment (OT)  Downgraded secondary to slow progress  Problem: RH Dressing Goal: LTG Patient will perform upper body dressing (OT) LTG Patient will perform upper body dressing with assist, with/without cues (OT).  Downgraded secondary to slow progress Goal: LTG Patient will perform lower body dressing w/assist (OT) LTG: Patient will perform lower body dressing with assist, with/without cues in positioning using equipment (OT)  Downgraded secondary to slow progress  Problem: RH Toileting Goal: LTG Patient will perform toileting w/assist, cues/equip (OT) LTG: Patient will perform toiletiing (clothes management/hygiene) with assist, with/without cues using equipment (OT)  Downgraded secondary to slow progress  Problem: RH Tub/Shower Transfers Goal: LTG Patient will perform tub/shower transfers w/assist (OT) LTG: Patient will perform tub/shower transfers with assist, with/without cues using equipment (OT)  Outcome: Not Applicable Date Met: 03/47/42 N/A due to family not being able to fit w/c or walker inside of bathroom for transfer. They are opting to assist pt with sponge bathing at this time for safety  Problem: RH Balance Goal: LTG Patient will maintain dynamic standing with ADLs (OT) LTG:  Patient will maintain dynamic standing balance with assist during activities of daily living (OT)   Downgraded secondary to slow progress

## 2017-03-14 NOTE — Progress Notes (Addendum)
Occupational Therapy Weekly Progress Note  Patient Details  Name: Anthony [facility] MRN: [id number] Date of Birth: 03-23-1926   Today's Date: 03/14/2017 OT Individual Time: 8469-6295 OT Individual Time Calculation (min): 43 min   Beginning of progress report period: 03/08/17 End of progress report period: 03/14/17  Patient has met 0 of 3 short term goals.  Pt has made slow progress during this report period. He still requires Max A-2 helpers for functional transfers and Mod-Max A for ADL tasks. Pts cognition and visual deficits have limited functional gains. Will continue to work on remediation of stated deficits, as well as strength, endurance, and standing balance until time of discharge. Family education has also been initiated and will carry on during this upcoming week.    Patient continues to demonstrate the following deficits: muscle weakness, decreased cardiorespiratoy endurance, decreased visual acuity and decreased visual perceptual skills, decreased initiation, decreased attention, decreased awareness, decreased safety awareness and decreased memory and decreased postural control, hemiplegia and decreased balance strategies and therefore will continue to benefit from skilled OT intervention to enhance overall performance with BADL.  Patient not progressing toward long term goals.  See goal revision..  Plan of care revisions: please refer to OT POC.  OT Short Term Goals Week 3:  OT Short Term Goal 1 (Week 3): STGs=LTGs due to ELOS  Skilled Therapeutic Interventions/Progress Updates: Pt seen for makeup session. Family was present and agreeable to family education. Practiced slideboard transfers to drop arm commode and back to bed. 2 helpers required for each transfer, alternating between daughter [name] and son Max, and 2 of pts grandchildren (all have caregiving responsibilities). With instruction, family members were able to demonstrate carryover of education during transfers. Family  instructed on using lateral leaning for hygiene, having w/c and bed flank BSC with pillows for leaning support. Openly discussed pts high burden of care, with caregiver Max reporting that he plans to bring pt home regardless. Went over bed controls on hospital bed for decreasing caregiver burden during self care tasks. Family able to boost pt up in bed utilizing proper body mechanics and technique with OT instruction. At end of session pt was repositioned in bed to protect hemiplegic side. Pt left with family at time of departure.       Therapy Documentation Precautions:  Precautions Precautions: Fall Precaution Comments: L hemi; pushes L Restrictions Weight Bearing Restrictions: No General: General PT Missed Treatment Reason: Patient fatigue Vital Signs: Therapy Vitals Temp: 98.4 F (36.9 C) Temp Source: Oral Pulse Rate: (!) 111 Resp: 20 BP: (!) 143/58 Patient Position (if appropriate): Lying Oxygen Therapy SpO2: 99 % O2 Device: Not Delivered Pain: Pt did not verbalize or express feelings of pain during tx    ADL: ADL ADL Comments: refer to functional navigator    See Function Navigator for Current Functional Status.   Therapy/Group: Individual Therapy  Robbin Escher A Patty Lopezgarcia 03/14/2017, 3:51 PM

## 2017-03-14 NOTE — Plan of Care (Signed)
Problem: RH BLADDER ELIMINATION Goal: RH STG MANAGE BLADDER WITH EQUIPMENT WITH ASSISTANCE STG Manage Bladder With Equipment With Mod Assistance   Outcome: Not Progressing Total assist-incontinent of urine.

## 2017-03-14 NOTE — Progress Notes (Signed)
Uneventful night. No attempts OOB. Bedalarm in use to alert staff. Neuro assessment at 2030 unchanged from baseline. Denies pain. 1/2 NS at 62ml/hr-1900-0700. Incont. Of urine X's 2 thus far. Doesn't alert staff when wet. At 2230 bladder scan=13cc's. Patrici Ranks A

## 2017-03-14 NOTE — Plan of Care (Signed)
Problem: RH Wheelchair Mobility Goal: LTG Patient will propel w/c in controlled environment (PT) LTG: Patient will propel wheelchair in controlled environment, # of feet with assist (PT)  100 ft; downgrade 2/2 slow progress Goal: LTG Patient will propel w/c in home environment (PT) LTG: Patient will propel wheelchair in home environment, # of feet with assistance (PT).  25 ft; downgrade 2/2 slow progress

## 2017-03-15 ENCOUNTER — Inpatient Hospital Stay (HOSPITAL_COMMUNITY): Payer: Medicare Other | Admitting: Physical Therapy

## 2017-03-15 ENCOUNTER — Inpatient Hospital Stay (HOSPITAL_COMMUNITY): Payer: Medicare Other | Admitting: Occupational Therapy

## 2017-03-15 ENCOUNTER — Inpatient Hospital Stay (HOSPITAL_COMMUNITY): Payer: Medicare Other | Admitting: Speech Pathology

## 2017-03-15 LAB — BASIC METABOLIC PANEL
Anion gap: 11 (ref 5–15)
BUN: 36 mg/dL — AB (ref 6–20)
CALCIUM: 9.1 mg/dL (ref 8.9–10.3)
CO2: 21 mmol/L — AB (ref 22–32)
Chloride: 102 mmol/L (ref 101–111)
Creatinine, Ser: 1.1 mg/dL (ref 0.61–1.24)
GFR calc Af Amer: >60 mL/min (ref >60)
GFR calc non Af Amer: 57 mL/min — ABNORMAL LOW (ref >60)
GLUCOSE: 120 mg/dL — AB (ref 65–99)
POTASSIUM: 4.6 mmol/L (ref 3.5–5.1)
Sodium: 134 mmol/L — ABNORMAL LOW (ref 135–145)

## 2017-03-15 MED ORDER — BETHANECHOL CHLORIDE 10 MG PO TABS
10.0000 mg | ORAL_TABLET | Freq: Three times a day (TID) | ORAL | Status: DC
  Administered 2017-03-15 – 2017-03-17 (×6): 10 mg via ORAL
  Filled 2017-03-15 (×6): qty 1

## 2017-03-15 NOTE — Progress Notes (Signed)
Occupational Therapy Session Note  Patient Details  Name: Anthony Fisher MRN: [id number] Date of Birth: 1926/03/12  Today's Date: 03/15/2017 OT Individual Time: 8638-1771 OT Individual Time Calculation (min): 60 min    Short Term Goals: Week 3:  OT Short Term Goal 1 (Week 3): STGs=LTGs due to ELOS  Skilled Therapeutic Interventions/Progress Updates:    Upon entering the room, pt supine in bed with daughter present in the room for continued family education. Pt with no c/o pain throughout session. Pt continues to need cues for alertness throughout session. OT discussed and education pt progress towards goals, current OT goals, discharge recommendations, and recommended follow up therapies. Caregiver asking questions as appropriate. Pt incontinent of urine this session and required total A for hygiene and LB clothing management from supine. He was able to bridge in order to pull pants over B hips. Pt rolling L <> R with max A. Supine >sit with max A provided from daughter. OT providing continued education to caregiver in regards to slide board transfer from bed >wheelchair. Second helper utilized to stabilize equipment for safety. Pt seated in wheelchair at sink for UB self care. Pt initiated functional use of L UE to apply deodorant and max verbal cues to continue to utilize throughout session. Pt remained in wheelchair at sink as therapist exited the room. All needs within reach.   Therapy Documentation Precautions:  Precautions Precautions: Fall Precaution Comments: L hemi; pushes L Restrictions Weight Bearing Restrictions: No General:   Vital Signs:  Pain:   ADL: ADL ADL Comments: refer to functional navigator Exercises:   Other Treatments:    See Function Navigator for Current Functional Status.   Therapy/Group: Individual Therapy  Gypsy Decant 03/15/2017, 12:38 PM

## 2017-03-15 NOTE — Progress Notes (Signed)
[facility] PHYSICAL MEDICINE & REHABILITATION     PROGRESS NOTE    Subjective/Complaints: Up in bed. Just waking up. No nproblems reported.   ROS: pt denies nausea, vomiting, diarrhea, cough, shortness of breath or chest pain   Objective: Vital Signs: Blood pressure (!) 131/53, pulse 79, temperature 98 F (36.7 C), temperature source Oral, resp. rate 18, weight 79.6 kg (175 lb 8 oz), SpO2 98 %. No results found. No results for input(s): WBC, HGB, HCT, PLT in the last 72 hours.  Recent Labs  03/15/17 0627  NA 134*  K 4.6  CL 102  GLUCOSE 120*  BUN 36*  CREATININE 1.10  CALCIUM 9.1   CBG (last 3)  No results for input(s): GLUCAP in the last 72 hours.  Wt Readings from Last 3 Encounters:  03/15/17 79.6 kg (175 lb 8 oz)  02/23/17 85.7 kg (189 lb)    Physical Exam:  Constitutional: NAD. Vital signs reviewed.  HENT: Normocephalic and atraumatic.  Eyes: EOMI. No discharge.  Cardiovascular: RRR. Respiratory: CTA B.  GI: Soft. Bowel sounds are normal.  Musculoskeletal: He exhibits no edema or tenderness.  Neurological:   Speech clearer at times/inconsistent Left facial weakness.   LUE 3-/5 with ?tr tone LLE 3+/5 Skin: Mild abrasion to knee healing Psychiatric:  Remains flat   Assessment/Plan: 1. Functional and cognitive deficits secondary to right thalamic hemorrhage which require 3+ hours per day of interdisciplinary therapy in a comprehensive inpatient rehab setting. Physiatrist is providing close team supervision and 24 hour management of active medical problems listed below. Physiatrist and rehab team continue to assess barriers to discharge/monitor patient progress toward functional and medical goals.  Function:  Bathing Bathing position Bathing activity did not occur: Safety/medical concerns (pt too lethargic to participate) Position: Bed (1/2 parts)  Bathing parts Body parts bathed by patient: Front perineal area Body parts bathed by helper: Buttocks   Bathing assist Assist Level:  (mod A 1/2 parts)      Upper Body Dressing/Undressing Upper body dressing   What is the patient wearing?: Pull over shirt/dress     Pull over shirt/dress - Perfomed by patient: Put head through opening Pull over shirt/dress - Perfomed by helper: Thread/unthread right sleeve, Thread/unthread left sleeve, Pull shirt over trunk        Upper body assist Assist Level:  (max A)      Lower Body Dressing/Undressing Lower body dressing   What is the patient wearing?: Pants, Socks, Shoes       Pants- Performed by helper: Thread/unthread right pants leg, Thread/unthread left pants leg, Pull pants up/down   Non-skid slipper socks- Performed by helper: Don/doff right sock, Don/doff left sock   Socks - Performed by helper: Don/doff right sock, Don/doff left sock   Shoes - Performed by helper: Don/doff right shoe, Don/doff left shoe, Fasten right, Fasten left          Lower body assist Assist for lower body dressing:  (total A)      Toileting Toileting Toileting activity did not occur: Safety/medical concerns   Toileting steps completed by helper: Performs perineal hygiene, Adjust clothing prior to toileting, Adjust clothing after toileting    Toileting assist Assist level: Two helpers   Transfers Chair/bed transfer   Chair/bed transfer method: Squat pivot, Lateral scoot Chair/bed transfer assist level: 2 helpers Chair/bed transfer assistive device: Armrests, Sliding board Mechanical lift: Maximove   Locomotion Ambulation Ambulation activity did not occur: Safety/medical concerns   Max distance: 97ft Assist level: 2  helpers   Oceanographer activity did not occur: Safety/medical concerns Type: Manual Max wheelchair distance: 17ft  Assist Level: Maximal assistance (Pt 25 - 49%)  Cognition Comprehension Comprehension assist level: Understands basic 25 - 49% of the time/ requires cueing 50 - 75% of the time  Expression Expression  assist level: Expresses basic 25 - 49% of the time/requires cueing 50 - 75% of the time. Uses single words/gestures.  Social Interaction Social Interaction assist level: Interacts appropriately less than 25% of the time. May be withdrawn or combative.  Problem Solving Problem solving assist level: Solves basic 25 - 49% of the time - needs direction more than half the time to initiate, plan or complete simple activities  Memory Memory assist level: Recognizes or recalls 25 - 49% of the time/requires cueing 50 - 75% of the time   Medical Problem List and Plan: 1.  Functional deficits secondary to right thalamic hemorrhage             -continue CIR 2.  DVT Prophylaxis/Anticoagulation: Pharmaceutical: Lovenox 3. Pain Management: Tylenol prn.  4. Mood: LCSW to follow for evaluation and support when appropriate.   5. Neuropsych: This patient is not capable of making decisions on his own behalf.  -sleep improving  -limit neurosedating meds as possible  -rx'ed UTI  -increased ritalin to 10mg  bid 6. Skin/Wound Care: routine pressure relief measures. Maintain adequate nutritional and hydration status.  7. Fluids/Electrolytes/Nutrition:   Encourage nectar thick liquids intake.   -BUN increased further to 36 today   -increase IVF at night to 75 cc/hr   -   8. Dementia: Monitor sleep wake cycle. Maintain consistent schedule.  9. HTN:  Controlled 4/8  Cont Norvasc daily with Prinivil bid--titrate as needed 10. Pre- renal azotemia:      -BUN elevated 33 on 4/6 11. Urinary retention/hx of BPH:    - I/O cath prn although pvr's low now  -continue flomax  -increased urecholine to 25mg  TID  -up to toilet/commode to empty/timed voids  -now voiding but incontinent---continue timed voids 12. Anemia:  Hgb 12.9. On 4/6 13. Dysphagia: On dysphagia 1, nectar liquids with aspiration precautions.  14. Hyperglycemia: Hgb A1C- 5.5. Likely due to thickened juices for hydration.  15. Mild CHF due to Fluid  overload: Monitor weights daily especially with IVF- no SOB  - Low salt diet.   -weight stable 16. Fall   On 4/7  Mild abrasion to knee. No other trauma reported. Neurologically unchanged 17. Fever  Resolved--monitor   LOS (Days) 20 A FACE TO FACE EVALUATION WAS PERFORMED  Meredith Staggers, MD 03/15/2017 9:14 AM

## 2017-03-15 NOTE — Progress Notes (Signed)
Physical Therapy Session Note  Patient Details  Name: Anthony Fisher MRN: [id number] Date of Birth: 1926-03-15  Today's Date: 03/15/2017 PT Individual Time: 4734-0370 PT Individual Time Calculation (min): 55 min   Short Term Goals: Week 3:  PT Short Term Goal 1 (Week 3): Pt will propel w/c 75 ft with mod assist. PT Short Term Goal 2 (Week 3): Pt will consistently transfer bed<>w/c with max assist +1.  Skilled Therapeutic Interventions/Progress Updates:  Pt received in bed, awake & alert. Pt initiated transfer supine>sit but still required mod assist and max multimodal cuing for sequencing. Once sitting at EOB pt required steady assist for sitting balance while therapist donned B shoes total assist. Pt completed stand pivot bed>w/c with +2 assist for safety. Transported pt to gym via w/c total assist. Focused on w/c mobility with R hemi technique but pt unable to coordinate RUE & RLE movements and instead propels better with RLE only. Pt requires cuing for obstacle avoidance on L and multiple rest breaks to propel 50 ft 2/2 fatigue. Pt transferred w/c<>nu-step via squat pivot with +2 assist (pt=25%). Pt utilized nu-step L1 x 12 minutes with all four extremities and LUE attachment. Task focused on coordination of reciprocal movements, attention to task, and increasing activity tolerance. Pt required rest breaks 2/2 fatigue & cuing to attend to task. Pt returned to w/c and engaged in peg board task from pre-selected pieces. Pt unable to assemble most simple pattern even with max multimodal cuing/instruction and therapist initiating task. Pt continues to require cuing to attend to L during task. At end of session pt left sitting in w/c with QRB & chair alarm donned & all needs within reach. Pt in room per RN reporting she felt comfortable with him sitting in room by himself.  Throughout session pt perseverating on "today's a holiday" or "today's Saturday" despite consistent, max education on today's  date/day of the week. Pt also unable to recall he is in a hospital, given a choice of 2.  Therapy Documentation Precautions:  Precautions Precautions: Fall Precaution Comments: L hemi; pushes L Restrictions Weight Bearing Restrictions: No  Pain: Faces - no pain.   See Function Navigator for Current Functional Status.   Therapy/Group: Individual Therapy  [name] 03/15/2017, 4:56 PM

## 2017-03-15 NOTE — Progress Notes (Signed)
Speech Language Pathology Daily Session Note  Patient Details  Name: AKBAR SACRA MRN: [id number] Date of Birth: Aug 19, 1926  Today's Date: 03/15/2017 SLP Individual Time: 1105-1205 SLP Individual Time Calculation (min): 60 min  Short Term Goals: Week 3: SLP Short Term Goal 1 (Week 3): Pt will demonstrate sustained attention to familiar task for ~5 minutes with Min A verbal cues.  SLP Short Term Goal 2 (Week 3): Patient will utilize an increased vocal intensity to maximize speech intelligibility to ~90% at the phrase level with supervision verbal cues.  SLP Short Term Goal 3 (Week 3): Patient will consume current diet with minimal overt s/s of aspiration with Mod A verbal cues for use of swallowing compenstory strategies.  SLP Short Term Goal 4 (Week 3): Patient will consume trials of ice chips with overt s/s of aspiration in 25% of trials or less over 2 sessions with Mod A verbal cues to assess readiness for repeat MBS.  SLP Short Term Goal 5 (Week 3): Patient will demonstrate management of secretions and will initiate swallowing his saliva in 50% of opportunities with Mod A verbal cues.   Skilled Therapeutic Interventions: Skilled treatment session focused on dysphagia goals. Patient consumed lunch meal of Dys. 1 textures with nectar-thick liquids via straw with Min A verbal cues needed for initiation of self-feeding and Min-Mod A verbal cues to self-monitor and correct oral residue and left anterior spillage. Patient also independently utilized his LUE during self-feeding tasks in 75% of opportunities. Patient with overt cough X 1, suspect due to delayed swallow initiation. Overall, patient's utilization of swallowing compensatory strategies was much improved this session. Continue with current plan of care.          Function:  Eating Eating   Modified Consistency Diet: Yes Eating Assist Level: Set up assist for;Supervision or verbal cues;Helper checks for pocketed food   Eating Set Up  Assist For: Opening containers       Cognition Comprehension Comprehension assist level: Understands basic 25 - 49% of the time/ requires cueing 50 - 75% of the time  Expression   Expression assist level: Expresses basic 25 - 49% of the time/requires cueing 50 - 75% of the time. Uses single words/gestures.  Social Interaction Social Interaction assist level: Interacts appropriately less than 25% of the time. May be withdrawn or combative.  Problem Solving Problem solving assist level: Solves basic 25 - 49% of the time - needs direction more than half the time to initiate, plan or complete simple activities  Memory Memory assist level: Recognizes or recalls 25 - 49% of the time/requires cueing 50 - 75% of the time    Pain Pain Assessment Pain Assessment: No/denies pain  Therapy/Group: Individual Therapy  Deylan Canterbury 03/15/2017, 4:07 PM

## 2017-03-16 ENCOUNTER — Inpatient Hospital Stay (HOSPITAL_COMMUNITY): Payer: Medicare Other | Admitting: Speech Pathology

## 2017-03-16 ENCOUNTER — Inpatient Hospital Stay (HOSPITAL_COMMUNITY): Payer: Medicare Other | Admitting: Physical Therapy

## 2017-03-16 ENCOUNTER — Inpatient Hospital Stay (HOSPITAL_COMMUNITY): Payer: Medicare Other | Admitting: Occupational Therapy

## 2017-03-16 NOTE — Progress Notes (Signed)
Speech Language Pathology Daily Session Note  Patient Details  Name: GEO SLONE MRN: [id number] Date of Birth: May 07, 1926  Today's Date: 03/16/2017 SLP Individual Time: 1130-1225 SLP Individual Time Calculation (min): 55 min  Short Term Goals: Week 3: SLP Short Term Goal 1 (Week 3): Pt will demonstrate sustained attention to familiar task for ~5 minutes with Min A verbal cues.  SLP Short Term Goal 2 (Week 3): Patient will utilize an increased vocal intensity to maximize speech intelligibility to ~90% at the phrase level with supervision verbal cues.  SLP Short Term Goal 3 (Week 3): Patient will consume current diet with minimal overt s/s of aspiration with Mod A verbal cues for use of swallowing compenstory strategies.  SLP Short Term Goal 4 (Week 3): Patient will consume trials of ice chips with overt s/s of aspiration in 25% of trials or less over 2 sessions with Mod A verbal cues to assess readiness for repeat MBS.  SLP Short Term Goal 5 (Week 3): Patient will demonstrate management of secretions and will initiate swallowing his saliva in 50% of opportunities with Mod A verbal cues.   Skilled Therapeutic Interventions: Skilled treatment session focused on dysphagia and cognitive goals. Patient consumed lunch meal of Dys. 1 textures with nectar-thick liquids via straw with Max A verbal cues needed for initiation of self-feeding due to fatigue and Mod-Max A verbal cues to self-monitor and correct oral residue and left anterior spillage.  Patient with overt cough X 1, suspect due to delayed swallow initiation. Overall patient continues to demonstrate decreased oral residue and left anterior spillage during meals. Patient demonstrated sustained attention to self-feeding for ~1 minute intervals and independently recalled that today is Tuesday. Patient was also 90% intelligible at the sentence level with Mod I. Patient left upright in bed with all needs within reach. Continue with current plan of  care.      Function:  Eating Eating   Modified Consistency Diet: Yes Eating Assist Level: Set up assist for;Supervision or verbal cues;Helper checks for pocketed food   Eating Set Up Assist For: Opening containers       Cognition Comprehension Comprehension assist level: Understands basic 25 - 49% of the time/ requires cueing 50 - 75% of the time  Expression   Expression assist level: Expresses basic 25 - 49% of the time/requires cueing 50 - 75% of the time. Uses single words/gestures.  Social Interaction Social Interaction assist level: Interacts appropriately less than 25% of the time. May be withdrawn or combative.  Problem Solving Problem solving assist level: Solves basic 25 - 49% of the time - needs direction more than half the time to initiate, plan or complete simple activities  Memory Memory assist level: Recognizes or recalls 25 - 49% of the time/requires cueing 50 - 75% of the time    Pain Pain Assessment Pain Assessment: No/denies pain  Therapy/Group: Individual Therapy  Retaj Hilbun 03/16/2017, 12:50 PM

## 2017-03-16 NOTE — Progress Notes (Signed)
Occupational Therapy Session Note  Patient Details  Name: JETTIE LAZARE MRN: [id number] Date of Birth: 03/31/1926  Today's Date: 03/16/2017 OT Individual Time: 0800-0900 OT Individual Time Calculation (min): 60 min    Short Term Goals: Week 3:  OT Short Term Goal 1 (Week 3): STGs=LTGs due to ELOS  Skilled Therapeutic Interventions/Progress Updates:    Upon entering the room, pt supine in bed and sleeping soundly. Pt requiring max cues this morning to attend to tasks for the first 30 minutes of session. OT provided pt with urinal but he was unable to void. However, later in session pt verbalizing, " I need to use the bathroom" but at this time he was already actively urinating and soiled bed linen. OT assisted pt with LB self care before exiting the bed. Total A squat pivot transfer from bed > wheelchair towards L side. His daughter arrived and had several questions regarding possible SNF with short term rehab stay after discharge. OT answering questions and notifying social worker as well. Pt engaged in bathing UB from wheelchair at sink with mod cues to attend to L side but able to continue use of L UE functionally to apply deodorant and wash R UE. Pt was unable to follow commands or multimodal cues to sequence hemiplegic UB dressing. Pt remained seated in wheelchair at end of session with daughter present in room.   Therapy Documentation Precautions:  Precautions Precautions: Fall Precaution Comments: L hemi; pushes L Restrictions Weight Bearing Restrictions: No    ADL: ADL ADL Comments: refer to functional navigator Exercises:   Other Treatments:    See Function Navigator for Current Functional Status.   Therapy/Group: Individual Therapy  Gypsy Decant 03/16/2017, 10:40 AM

## 2017-03-16 NOTE — Progress Notes (Signed)
Physical Therapy Weekly Progress Note  Patient Details  Name: Anthony Fisher MRN: [id number] Date of Birth: 1926/06/09  Beginning of progress report period: March 09, 2017 End of progress report period: March 16, 2017  Today's Date: 03/16/2017  Patient has met 0 of 2 short term goals.  Pt is making slow progress towards LTG's therefore LTG's have been downgraded to max assist. During this reporting period, family training was initiated with daughter [name]) but she reports the family now wishes for pt to d/c to SNF. Pt continues to be limited by lethargy throughout all sessions, despite change in schedule to 15/7. Pt would benefit from skilled PT treatment to focus on bed mobility, transfers, safety awareness, cognitive remediation and L NMR.  Patient continues to demonstrate the following deficits muscle weakness, decreased cardiorespiratoy endurance, decreased coordination, decreased visual perceptual skills, decreased attention to left, decreased initiation, decreased attention, decreased awareness, decreased problem solving, decreased safety awareness, decreased memory and delayed processing, and decreased sitting balance, decreased standing balance, decreased postural control, hemiplegia and decreased balance strategies and therefore will continue to benefit from skilled PT intervention to increase functional independence with mobility.  Pt's goals have been downgraded to max assist this week 2/2 pt's slow progress.  PT Short Term Goals Week 3:  PT Short Term Goal 1 (Week 3): Pt will propel w/c 75 ft with mod assist. PT Short Term Goal 1 - Progress (Week 3): Not met PT Short Term Goal 2 (Week 3): Pt will consistently transfer bed<>w/c with max assist +1. PT Short Term Goal 2 - Progress (Week 3): Not met Week 4:  PT Short Term Goal 1 (Week 4): STG = LTG due to estimated d/c date.   Therapy Documentation Precautions:  Precautions Precautions: Fall Precaution Comments: L hemi; pushes  L Restrictions Weight Bearing Restrictions: No   See Function Navigator for Current Functional Status.  Therapy/Group: Individual Therapy  [name] 03/16/2017, 5:09 PM

## 2017-03-16 NOTE — Progress Notes (Signed)
Social Work Patient ID: Anthony Fisher, male   DOB: 1926/04/23, 83 y.o.   MRN: [id number]  Met with pt and daughter today to discuss d/c plans.  Daughter reports that family simply cannot meet pt's current care needs and they would like to pursue SNF.  Pt making slow gains but, gains none the less.  All hopeful that a few more weeks of tx at SNF will make needed progress and then can bring pt home.  Pt in agreement with these plans.  Have begun bed search.  Anthony Soward, LCSW

## 2017-03-16 NOTE — Plan of Care (Signed)
Problem: RH Bed to Chair Transfers Goal: LTG Patient will perform bed/chair transfers w/assist (PT) LTG: Patient will perform bed/chair transfers with assistance, with/without cues (PT).  Downgrade 2/2 slow progress

## 2017-03-16 NOTE — NC FL2 (Signed)
[place] LEVEL OF CARE SCREENING TOOL     IDENTIFICATION  Patient Name: Anthony Fisher Birthdate: 03-11-1926 Sex: male Admission Date (Current Location): 02/23/2017  [facility] and [place] Number:  Herbalist and Address:  The [place], [place] [address] [place],  [address]      Provider Number: [id number]  Attending Physician Name and Address:  Meredith Staggers, MD  Relative Name and Phone Number:  Dorothea Glassman, Daughter, [phone]    Current Level of Care: Other (Comment) (Acute Inpatient Rehab Unit) Recommended Level of Care: [place] Prior Approval Number:    Date Approved/Denied:   PASRR Number: [id number]  Discharge Plan: SNF    Current Diagnoses: Patient Active Problem List   Diagnosis Date Noted  . CHF (congestive heart failure) ([place])   . Fall   . Fever   . Benign essential HTN   . Labile blood pressure   . Prerenal azotemia   . Hemiparesis of left dominant side due to nontraumatic intracerebral hemorrhage ([place]) 02/24/2017  . IVH (intraventricular hemorrhage) ([place]) 02/23/2017  . Hypertensive emergency 02/23/2017  . Dysphagia due to recent stroke 02/23/2017  . Urinary retention 02/23/2017  . Dementia 02/23/2017  . Congestive heart failure ([place]) 02/23/2017  . Hypokalemia 02/23/2017  . Thalamic hemorrhage ([place]) 02/23/2017  . 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 02/16/2017    Orientation RESPIRATION BLADDER Height & Weight     Self, Time, Situation, Place  Normal Incontinent Weight: 78.6 kg (173 lb 4.5 oz) Height:     BEHAVIORAL SYMPTOMS/MOOD NEUROLOGICAL BOWEL NUTRITION STATUS      Incontinent Diet (Dys 1, nectar liquids)  AMBULATORY STATUS COMMUNICATION OF NEEDS Skin   Extensive Assist Verbally Other (Comment) (rash on bottom)                       Personal Care Assistance Level of Assistance  Bathing, Feeding, Dressing, Total care Bathing Assistance:  Maximum assistance Feeding assistance: Limited assistance Dressing Assistance: Maximum assistance Total Care Assistance: Maximum assistance   Functional Limitations Info             SPECIAL CARE FACTORS FREQUENCY  PT (By licensed PT), OT (By licensed OT), Bowel and bladder program, Speech therapy     PT Frequency: 5x/wk OT Frequency: 5x/wk Bowel and Bladder Program Frequency: qd - better managing continence with timed toileting   Speech Therapy Frequency: 5x/wk      Contractures Contractures Info: Not present    Additional Factors Info  Code Status, Allergies Code Status Info: Full Allergies Info: Tape           Current Medications (03/16/2017):  This is the current hospital active medication list Current Facility-Administered Medications  Medication Dose Route Frequency Provider Last Rate Last Dose  . 0.45 % sodium chloride infusion   Intravenous Continuous Meredith Staggers, MD   Stopped at 03/16/17 [id number]  . acetaminophen (TYLENOL) tablet 325-650 mg  325-650 mg Oral Q4H PRN Bary Leriche, PA-C   325 mg at 03/04/17 1148  . alum & mag hydroxide-simeth (MAALOX/MYLANTA) 200-200-20 MG/5ML suspension 30 mL  30 mL Oral Q4H PRN Bary Leriche, PA-C      . amLODipine (NORVASC) tablet 10 mg  10 mg Oral Daily Bary Leriche, PA-C   10 mg at 03/16/17 1638  . bethanechol (URECHOLINE) tablet 10 mg  10 mg Oral TID Meredith Staggers, MD   10 mg at  03/16/17 1529  . bisacodyl (DULCOLAX) suppository 10 mg  10 mg Rectal Daily PRN Bary Leriche, PA-C      . chlorhexidine (PERIDEX) 0.12 % solution 15 mL  15 mL Mouth Rinse BID Ivan Anchors Love, PA-C   15 mL at 03/16/17 0845  . diphenhydrAMINE (BENADRYL) 12.5 MG/5ML elixir 12.5-25 mg  12.5-25 mg Oral Q6H PRN Ivan Anchors Love, PA-C      . guaiFENesin-dextromethorphan (ROBITUSSIN DM) 100-10 MG/5ML syrup 5-10 mL  5-10 mL Oral Q6H PRN Bary Leriche, PA-C      . heparin injection 5,000 Units  5,000 Units Subcutaneous Q8H Bary Leriche, PA-C   5,000 Units at  03/16/17 1529  . lisinopril (PRINIVIL,ZESTRIL) tablet 20 mg  20 mg Oral BID Bary Leriche, PA-C   20 mg at 03/16/17 0844  . MEDLINE mouth rinse  15 mL Mouth Rinse q12n4p Ivan Anchors Love, PA-C   15 mL at 03/16/17 1529  . Melatonin TABS 3 mg  3 mg Oral QHS Ivan Anchors Love, PA-C   3 mg at 03/15/17 2239  . methylphenidate (RITALIN) tablet 10 mg  10 mg Oral BID WC Meredith Staggers, MD   10 mg at 03/16/17 1241  . multivitamin with minerals tablet 1 tablet  1 tablet Oral Daily Bary Leriche, PA-C   1 tablet at 03/16/17 0844  . nitroGLYCERIN (NITROGLYN) 2 % ointment 0.5 inch  0.5 inch Topical Q6H PRN Bary Leriche, PA-C      . pantoprazole sodium (PROTONIX) 40 mg/20 mL oral suspension 40 mg  40 mg Oral Daily Bary Leriche, PA-C   40 mg at 03/16/17 0844  . polyethylene glycol (MIRALAX / GLYCOLAX) packet 17 g  17 g Oral Daily PRN Ivan Anchors Love, PA-C      . potassium chloride 20 MEQ/15ML (10%) solution 10 mEq  10 mEq Oral BID Meredith Staggers, MD   10 mEq at 03/16/17 0845  . prochlorperazine (COMPAZINE) tablet 5-10 mg  5-10 mg Oral Q6H PRN Bary Leriche, PA-C       Or  . prochlorperazine (COMPAZINE) injection 5-10 mg  5-10 mg Intramuscular Q6H PRN Bary Leriche, PA-C       Or  . prochlorperazine (COMPAZINE) suppository 12.5 mg  12.5 mg Rectal Q6H PRN Bary Leriche, PA-C      . RESOURCE THICKENUP CLEAR   Oral PRN Bary Leriche, PA-C      . sennosides (SENOKOT) 8.8 MG/5ML syrup 10 mL  10 mL Oral QHS Ivan Anchors Love, PA-C   10 mL at 03/11/17 2241  . sodium phosphate (FLEET) 7-19 GM/118ML enema 1 enema  1 enema Rectal Once PRN Bary Leriche, PA-C      . tamsulosin (FLOMAX) capsule 0.4 mg  0.4 mg Oral QPC supper Meredith Staggers, MD   0.4 mg at 03/15/17 1812  . traZODone (DESYREL) tablet 25 mg  25 mg Oral QHS PRN Bary Leriche, PA-C   25 mg at 03/15/17 2239     Discharge Medications: Please see discharge summary for a list of discharge medications.  Relevant Imaging Results:  Relevant Lab  Results:   Additional Information SSN: [id number]  Lavonte Palos, LCSW

## 2017-03-16 NOTE — Patient Care Conference (Signed)
Inpatient RehabilitationTeam Conference and Plan of Care Update Date: 03/16/2017   Time: 2:30 PM    Patient Name: Anthony Fisher      Medical Record Number: [id number]  Date of Birth: 03-14-1926 Sex: Male         Room/Bed: 4W17C/4W17C-01 Payor Info: Payor: [employer] MEDICARE / Plan: [facility] MEDICARE / Product Type: *No Product type* /    Admitting Diagnosis: R ICH  Admit Date/Time:  02/23/2017  8:02 PM Admission Comments: No comment available   Primary Diagnosis:  Thalamic hemorrhage (HCC) Principal Problem: Thalamic hemorrhage [facility])  Patient Active Problem List   Diagnosis Date Noted  . CHF (congestive heart failure) ([place])   . Fall   . Fever   . Benign essential HTN   . Labile blood pressure   . Prerenal azotemia   . Hemiparesis of left dominant side due to nontraumatic intracerebral hemorrhage ([place]) 02/24/2017  . IVH (intraventricular hemorrhage) ([place]) 02/23/2017  . Hypertensive emergency 02/23/2017  . Dysphagia due to recent stroke 02/23/2017  . Urinary retention 02/23/2017  . Dementia 02/23/2017  . Congestive heart failure ([place]) 02/23/2017  . Hypokalemia 02/23/2017  . Thalamic hemorrhage ([place]) 02/23/2017  . ICH (intracerebral hemorrhage) ([place]) - R thalamic hemorrhage 02/16/2017    Expected Discharge Date: Expected Discharge Date:  (plan changed to SNF)  Team Members Present: Physician leading conference: Ilean Skill, PsyD;Dr. [place] Worker Present: Lennart Pall, LCSW Nurse Present: Heather Roberts, RN PT Present: Lavone Nian, PT OT Present: Benay Pillow, OT SLP Present: Weston Anna, SLP PPS Coordinator present : Daiva Nakayama, RN, CRRN     Current Status/Progress Goal Weekly Team Focus  Medical   emptying bladder but incontinent. still with poor stamina  improve activity tolerance  bladder mgt, nutrition/hydration   Bowel/Bladder   Incont. of bowel and bladder. LBM 4-9 x2. Loose. PVRs low.   manage bowel and bladder max assist  timed  toleiting   Swallow/Nutrition/ Hydration   Dys. 1 textures with nectar-thicl liquids, Mod A  Mod A   increase use of swallow strategies    ADL's   Max A BADLs overall and Max A<>+2 assist for functional transfers. Decreased alertness and initiation persist   Mod-Max overall (goals downgraded)  Functional transfers, family education, sitting/standing balance, adaptive bathing/dressing, alertness, and L NMR   Mobility   1-2 person assist for bed<> w/c via squat pivot or slide board, max assist bed mobility  max assist transfers & gait, mod assist w/c mobility  pt/family education, d/c planning, NMR, bed mobility, transfers   Communication   Mod A  Mod A  use of speech intelligibility strategies, basic expression of wants/needs   Safety/Cognition/ Behavioral Observations  Mod-Max A for sustained attention   Mod A      Pain   denies pain at this time         Skin   scattered bruising to abdomen. rash to both buttocks- MGP  free skin breakdown min assist  assess skin q shift. use MGP as needed      *See Care Plan and progress notes for long and short-term goals.  Barriers to Discharge: incontinence, poor stamina    Possible Resolutions to Barriers:  continue to pace, educate family/care givers    Discharge Planning/Teaching Needs:  Per discussion with pt's daughter and pt today, plan has been changed to SNF to maximize therapy opportunity.  NA - plan changed to SNF   Team Discussion:  Little progress this week;  Slightly dehydrated.  Emptying bladder now but incontinent.  Max assist overall;  Beginning to use left arm functionally.  D1, nectar still. SW reports pt/family have changed d/c plan to SNF.  Revisions to Treatment Plan:  Change in d/c plan to SNF   Continued Need for Acute Rehabilitation Level of Care: The patient requires daily medical management by a physician with specialized training in physical medicine and rehabilitation for the following conditions: Daily direction  of a multidisciplinary physical rehabilitation program to ensure safe treatment while eliciting the highest outcome that is of practical value to the patient.: Yes Daily medical management of patient stability for increased activity during participation in an intensive rehabilitation regime.: Yes Daily analysis of laboratory values and/or radiology reports with any subsequent need for medication adjustment of medical intervention for : Neurological problems;Urological problems  Teena Mangus 03/16/2017, 6:26 PM

## 2017-03-16 NOTE — Plan of Care (Signed)
Problem: RH Ambulation Goal: LTG Patient will ambulate in controlled environment (PT) LTG: Patient will ambulate in a controlled environment, # of feet with assistance (PT).  Outcome: Adequate for Discharge D/c - goal not appropriate at this time

## 2017-03-16 NOTE — Progress Notes (Addendum)
Physical Therapy Session Note  Patient Details  Name: Anthony Fisher MRN: [id number] Date of Birth: 04-15-1926  Today's Date: 03/16/2017 PT Individual Time: 1007-1100 and 3846-6599 PT Individual Time Calculation (min): 53 min and 26 min  Short Term Goals: Week 3:  PT Short Term Goal 1 (Week 3): Pt will propel w/c 75 ft with mod assist. PT Short Term Goal 2 (Week 3): Pt will consistently transfer bed<>w/c with max assist +1.  Skilled Therapeutic Interventions/Progress Updates:  Treatment 1: Pt received in w/c with daughter [name]) present for session. Discussed d/c plans and f/u therapy recommendations with [name], and later [name]). [name] reports she would like for pt to d/c to SNF and LCSW now looking for a bed. Pt reporting being thirsty and consumed thickened liquid with cuing x 1 time for small sips. Transitioned to gym where pt transferred to standing in standing frame. Pt tolerated standing frame ~8 minutes while engaging in conversation to orient pt to location, time, situation, etc. Pt able to independently recall that he is in a "hospital" but unable to recall his correct age or birthday. After standing ~8 minutes pt began to slump forward onto standing frame table and unable to follow commands to push himself back upright. Pt assisted to w/c & vitals assessed, please see below. Pt assisted back to room & pt able to follow commands and engage in conversation with therapist on the way back to room. Assisted pt back to bed & RN made aware of events. BP reassessed and noted to have increased. Pt left in bed with all needs within reach, bed alarm set, RN, daughter & SLP present.   Treatment 2:  Pt received in bed & agreeable to tx. Pt required total assist to thread pants on BLE and to pull them over hips, although pt did attempt to initiate pulling pants over R hip. Pt required assistance for rolling to complete donning pants. Therapist donned tennis shoes total assist. Pt required max cuing  and max assist for supine>sit; pt would initiate movement but unable to demonstrate sufficient strength to upright trunk. Pt completed squat pivot EOB>w/c with total assist as pt unable to initiate or participate in movement. While sitting in w/c pt engaged in task focusing on one step commands, which pt was able to follow 50% of the time, and L inattention. At end of session pt left sitting in w/c with QRB donned & at nurses station. BP while sitting in w/c = 116/55 mmHg, HR = 86 bpm.  Therapy Documentation Precautions:  Precautions Precautions: Fall Precaution Comments: L hemi; pushes L Restrictions Weight Bearing Restrictions: No   Vital Signs: HR = 71-87 bpm SpO2 (room air) = 98% BP = 91/51 mmHg  Pain: Pain Assessment Pain Assessment: No/denies pain   See Function Navigator for Current Functional Status.   Therapy/Group: Individual Therapy  [name] 03/16/2017, 2:29 PM

## 2017-03-16 NOTE — Progress Notes (Signed)
[place] PHYSICAL MEDICINE & REHABILITATION     PROGRESS NOTE    Subjective/Complaints: Lying in bed. Slow to awaken. No new problems reported  ROS: Limited due cognitive/behavioral    Objective: Vital Signs: Blood pressure (!) 137/59, pulse 77, temperature 98.5 F (36.9 C), temperature source Oral, resp. rate 18, weight 78.6 kg (173 lb 4.5 oz), SpO2 98 %. No results found. No results for input(s): WBC, HGB, HCT, PLT in the last 72 hours.  Recent Labs  03/15/17 0627  NA 134*  K 4.6  CL 102  GLUCOSE 120*  BUN 36*  CREATININE 1.10  CALCIUM 9.1   CBG (last 3)  No results for input(s): GLUCAP in the last 72 hours.  Wt Readings from Last 3 Encounters:  03/16/17 78.6 kg (173 lb 4.5 oz)  02/23/17 85.7 kg (189 lb)    Physical Exam:  Constitutional: NAD. Vital signs reviewed.  HENT: Normocephalic and atraumatic.  Eyes: EOMI. No discharge.  Cardiovascular: RRR Respiratory: CTA B.  GI: Soft. Bowel sounds are normal.  Musculoskeletal: He exhibits no edema or tenderness.  Neurological:   Speech clearer at times/inconsistent Left facial weakness.   LUE 3-/5 with ?flexor tone LLE 3+/5 Skin: Mild abrasion to knee healing Psychiatric:  Remains flat   Assessment/Plan: 1. Functional and cognitive deficits secondary to right thalamic hemorrhage which require 3+ hours per day of interdisciplinary therapy in a comprehensive inpatient rehab setting. Physiatrist is providing close team supervision and 24 hour management of active medical problems listed below. Physiatrist and rehab team continue to assess barriers to discharge/monitor patient progress toward functional and medical goals.  Function:  Bathing Bathing position Bathing activity did not occur: Safety/medical concerns (pt too lethargic to participate) Position: Other (comment) (bed LB, UB in chair at sink)  Bathing parts Body parts bathed by patient: Right arm, Left arm, Chest, Abdomen Body parts bathed by  helper: Front perineal area, Buttocks, Right upper leg, Left upper leg, Right lower leg, Left lower leg, Back  Bathing assist Assist Level:  (max A)      Upper Body Dressing/Undressing Upper body dressing   What is the patient wearing?: Pull over shirt/dress     Pull over shirt/dress - Perfomed by patient: Put head through opening, Thread/unthread right sleeve Pull over shirt/dress - Perfomed by helper: Thread/unthread left sleeve, Pull shirt over trunk        Upper body assist Assist Level:  (mod A)      Lower Body Dressing/Undressing Lower body dressing   What is the patient wearing?: Pants, Socks, Shoes       Pants- Performed by helper: Thread/unthread right pants leg, Thread/unthread left pants leg, Pull pants up/down   Non-skid slipper socks- Performed by helper: Don/doff right sock, Don/doff left sock   Socks - Performed by helper: Don/doff right sock, Don/doff left sock   Shoes - Performed by helper: Don/doff right shoe, Don/doff left shoe, Fasten right, Fasten left          Lower body assist Assist for lower body dressing:  (total A)      Toileting Toileting Toileting activity did not occur: Safety/medical concerns   Toileting steps completed by helper: Performs perineal hygiene, Adjust clothing prior to toileting, Adjust clothing after toileting    Toileting assist Assist level: Two helpers   Transfers Chair/bed transfer   Chair/bed transfer method: Stand pivot, Squat pivot Chair/bed transfer assist level: 2 helpers Chair/bed transfer assistive device: Armrests, Sliding board Mechanical lift: Chiropractor  Ambulation activity did not occur: Safety/medical concerns   Max distance: 74ft Assist level: 2 helpers   Oceanographer activity did not occur: Safety/medical concerns Type: Manual Max wheelchair distance: 50 ft (RLE only) Assist Level: Supervision or verbal cues  Cognition Comprehension Comprehension assist level:  Understands basic 25 - 49% of the time/ requires cueing 50 - 75% of the time  Expression Expression assist level: Expresses basic 25 - 49% of the time/requires cueing 50 - 75% of the time. Uses single words/gestures.  Social Interaction Social Interaction assist level: Interacts appropriately less than 25% of the time. May be withdrawn or combative.  Problem Solving Problem solving assist level: Solves basic 25 - 49% of the time - needs direction more than half the time to initiate, plan or complete simple activities  Memory Memory assist level: Recognizes or recalls 25 - 49% of the time/requires cueing 50 - 75% of the time   Medical Problem List and Plan: 1.  Functional deficits secondary to right thalamic hemorrhage             -continue CIR  -team conference today. Will need to discuss with team regarding family's ability to provide needed care for patient 2.  DVT Prophylaxis/Anticoagulation: Pharmaceutical: Lovenox 3. Pain Management: Tylenol prn.  4. Mood: LCSW to follow for evaluation and support when appropriate.   5. Neuropsych: This patient is not capable of making decisions on his own behalf.  -sleep improving  -limit neurosedating meds as possible  -rx'ed UTI  -increased ritalin to 10mg  bid 6. Skin/Wound Care: routine pressure relief measures. Maintain adequate nutritional and hydration status.  7. Fluids/Electrolytes/Nutrition:   Encourage nectar thick liquids intake.   -BUN increased further to 36 today   -increased IVF at night to 75 cc/hr on 4/9  - re-check bmet Wednesday  8. Dementia: Monitor sleep wake cycle. Maintain consistent schedule.  9. HTN:  Controlled 4/8  Cont Norvasc daily with Prinivil bid--titrate as needed 10. Pre- renal azotemia:      -BUN elevated   11. Urinary retention/hx of BPH:    - I/O cath prn although pvr's low now  -continue flomax  -reduced urecholine to 10mg  TID  -up to toilet/commode to empty/timed voids  -now voiding but  incontinent---recent ucx negative 12. Anemia:  Hgb 12.9. On 4/6 13. Dysphagia: On dysphagia 1, nectar liquids with aspiration precautions.  14. Hyperglycemia: Hgb A1C- 5.5. Likely due to thickened juices for hydration.  15. 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   -weights ok 16. Fall   On 4/7  -no substantial sequelae 17. Fever  Resolved--monitor   LOS (Days) 21 A FACE TO FACE EVALUATION WAS PERFORMED  Meredith Staggers, MD 03/16/2017 8:57 AM

## 2017-03-16 NOTE — Plan of Care (Signed)
Problem: RH Wheelchair Mobility Goal: LTG Patient will propel w/c in controlled environment (PT) LTG: Patient will propel wheelchair in controlled environment, # of feet with assist (PT)  50 ft; downgrade 2/2 slow progress Goal: LTG Patient will propel w/c in home environment (PT) LTG: Patient will propel wheelchair in home environment, # of feet with assistance (PT).  Outcome: Adequate for Discharge D/c 2/2 slow progress

## 2017-03-17 ENCOUNTER — Inpatient Hospital Stay (HOSPITAL_COMMUNITY): Payer: Self-pay | Admitting: Physical Therapy

## 2017-03-17 ENCOUNTER — Inpatient Hospital Stay (HOSPITAL_COMMUNITY): Payer: Medicare Other | Admitting: Occupational Therapy

## 2017-03-17 ENCOUNTER — Inpatient Hospital Stay (HOSPITAL_COMMUNITY): Payer: Medicare Other | Admitting: Speech Pathology

## 2017-03-17 LAB — BASIC METABOLIC PANEL
Anion gap: 9 (ref 5–15)
BUN: 23 mg/dL — AB (ref 6–20)
CALCIUM: 9.2 mg/dL (ref 8.9–10.3)
CHLORIDE: 104 mmol/L (ref 101–111)
CO2: 23 mmol/L (ref 22–32)
CREATININE: 1 mg/dL (ref 0.61–1.24)
GFR calc non Af Amer: >60 mL/min (ref >60)
GLUCOSE: 110 mg/dL — AB (ref 65–99)
Potassium: 3.8 mmol/L (ref 3.5–5.1)
Sodium: 136 mmol/L (ref 135–145)

## 2017-03-17 MED ORDER — TRAZODONE HCL 50 MG PO TABS
50.0000 mg | ORAL_TABLET | Freq: Every evening | ORAL | Status: DC | PRN
  Administered 2017-03-17: 50 mg via ORAL
  Filled 2017-03-17: qty 1

## 2017-03-17 MED ORDER — AMLODIPINE BESYLATE 5 MG PO TABS
5.0000 mg | ORAL_TABLET | Freq: Every day | ORAL | Status: DC
Start: 2017-03-18 — End: 2017-03-18
  Administered 2017-03-18: 5 mg via ORAL
  Filled 2017-03-17: qty 1

## 2017-03-17 NOTE — Progress Notes (Signed)
Physical Therapy Session Note  Patient Details  Name: Anthony Fisher MRN: [id number] Date of Birth: 09/29/1926  Today's Date: 03/17/2017 PT Individual Time: 0906-1004 PT Individual Time Calculation (min): 58 min   Short Term Goals: Week 4:  PT Short Term Goal 1 (Week 4): STG = LTG due to estimated d/c date.  Skilled Therapeutic Interventions/Progress Updates:   Pt received sitting in WC and agreeable to PT. RN present for first 5 minutes of PT treatment for medication administration.   Pt with increased lethargy this AM and low BP per RN reports. Orthostasic vital assessment. Sitting: 99/76, Standing: 91/59. Pt asymptomatic. Returned to sitting after prolonger time in standing frame: 121/95. 4 minutes sitting. 112/60.   Standing frame for trunk control and righting reactions in standing. Pt instructed in cognitive task  Of tic-tac-toe with PT to improved arousal and maintain upright head posture aw well as minize the use of the RUE for support while standing max cues for use of marker on board and for decreased pushing to the L.   WC mobility with Modified Hemi techinque using the RLE and intermittently using the RUE. PT required to provide max assist to maintain focus on task and approximation forces through the RLE to improve contact with floor and increase success.   Sit<>stand transfer at rail in hall x 4 with mod assist from PT and cues for upright posture.   Gait at rail in hall with max assist from PT to prevent L lateral LOB as well as prevent hip adduction with LLE limb advancement as well as cues to maintain upright trunk posture. Pushing tendencies ceased while maintaining upright posture for 25% of walk.   Patient returned too room and left sitting in [facility] with call bell in reach and all needs met.           Therapy Documentation Precautions:  Precautions Precautions: Fall Precaution Comments: L hemi; pushes L Restrictions Weight Bearing Restrictions: No General:    Vital Signs: Therapy Vitals Pulse Rate: 98 BP: (!) 93/54 Patient Position (if appropriate): Sitting  See Function Navigator for Current Functional Status.   Therapy/Group: Individual Therapy  Lorie Phenix 03/17/2017, 10:42 AM

## 2017-03-17 NOTE — Progress Notes (Signed)
Occupational Therapy Session Note  Patient Details  Name: Anthony Fisher MRN: [id number] Date of Birth: [birth year]  Today's Date: 03/17/2017 OT Individual Time: 0700-0800 OT Individual Time Calculation (min): 60 min    Short Term Goals: Week 3:  OT Short Term Goal 1 (Week 3): STGs=LTGs due to ELOS  Skilled Therapeutic Interventions/Progress Updates:    Upon entering the room, pt supine in bed sleeping. Therapist providing constant cues to awaken pt throughout session. Pt unable to follow simple 1 step commands secondary to lethargy. Pt incontinent of urine once therapist entered and pt rolling L <> R with max A for hygiene and LB clothing management with second helper assisting. Supine >sit was dependent as pt still not opening eyes. Pt becoming more alert once seated on EOB. Static sitting balance for 5 minutes with min - max A. Total A squat pivot transfer from bed>wheelchair to the R. Pt sitting in wheelchair at sink and more fully awake. He utilizes Copy to shave face with R UE and is able to reach with L hand to touch face this session. OT assisted with shaving for thoroughness. Pt remained in wheelchair with chair alarm activated, quick release belt donned, and lap tray placed. Pt oriented to self only this session. Pt reports incorrect date, birthday, location, and situation. Call bell and all needed items within reach upon exiting the room.   Therapy Documentation Precautions:  Precautions Precautions: Fall Precaution Comments: L hemi; pushes L Restrictions Weight Bearing Restrictions: No General:   Vital Signs: Therapy Vitals Temp: 97.6 F (36.4 C) Temp Source: Oral Pulse Rate: 98 Resp: 18 BP: 112/70 Patient Position (if appropriate): Lying Oxygen Therapy SpO2: 94 % O2 Device: Not Delivered Pain:   ADL: ADL ADL Comments: refer to functional navigator Exercises:   Other Treatments:    See Function Navigator for Current Functional  Status.   Therapy/Group: Individual Therapy  Gypsy Decant 03/17/2017, 8:10 AM

## 2017-03-17 NOTE — Discharge Summary (Signed)
Physician Discharge Summary  Patient ID: Anthony Fisher MRN: [id number] DOB/AGE: Oct 24, 1926 83 y.o.  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 EXAM: PORTABLE CHEST 1 VIEW COMPARISON:  02/21/2017 . FINDINGS: Feeding tube noted with its tip below left hemidiaphragm. Cardiomegaly with mild bilateral interstitial prominence and small bilateral pleural effusions consistent with CHF. No pneumothorax . IMPRESSION: 1.  Feeding tube noted with tip below left hemidiaphragm. 2. Cardiomegaly with diffuse bilateral from interstitial prominence and small bilateral pleural effusions consistent with mild CHF. 3. Persistent mild basilar subsegmental atelectasis . Electronically Signed   By: Marcello Moores  Register   On: 02/22/2017 07:13    Labs:  Basic Metabolic Panel:  Recent Labs Lab 03/12/17 0648 03/15/17 0627 03/17/17 0439  NA 136 134* 136  K 4.4 4.6 3.8  CL 102 102 104  CO2 23 21* 23  GLUCOSE 109* 120* 110*  BUN 33* 36* 23*  CREATININE 1.18 1.10 1.00  CALCIUM 9.7 9.1 9.2    CBC: CBC Latest Ref Rng & Units 03/12/2017 02/26/2017 02/24/2017  WBC 4.0 - 10.5 K/uL 6.3 10.5 13.7(H)  Hemoglobin 13.0 - 17.0 g/dL 11.9(L) 11.7(L) 12.9(L)  Hematocrit 39.0 - 52.0 % 34.8(L) 35.2(L) 38.7(L)  Platelets 150 - 400 K/uL 248 266 288    CBG: No results for input(s): GLUCAP in the last 168 hours.   Brief HPI:    Anthony Fisher a 83 y.o.malewith a history of dementia who was admitted on 02/15/17 with left-sided weakness,  unsteady gait and fall. 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 CTA head/neck negative for stenosis, aneurysm or vascular malformation. MRI brain done revealing evolving right thalamic hemorrhage with old right caudate lacunar and advanced temporal lobe atropy--no CAA.  2 D echo with EF 60-65% with mild LVH and no motion abnormality. Labile blood pressures improving with adjustment of medications.  Patient with dysphagia due to sensorimotor as well as cognitive deficits--has been on tube feed for nutritional support.  Foley placed on 3/14 due to urinary retention needing in and out caths. MBS done 3/19 with initiation of dysphagia 1, nectar liquids. Lethargy resolving with improvement in participation. Patient with resultant left sided weakness, decreased balance at EOB, cognitive deficits needing needs max verbal and tactile cues to follow simple commands. CIR recommended for follow up therapy   Hospital Course: Anthony Fisher was admitted to rehab 02/23/2017 for inpatient therapies to consist of PT, ST and OT at least three hours five days a week. Past admission physiatrist, therapy team and rehab RN have worked together to provide customized collaborative inpatient rehab. He cotinued to have lethargy with excessive sleepiness and ritalin was added to help with activation and attention. Po intake has been poor and he has required IV fluids at nights to help maintain adequate hydration. Nursing offer thickened liquids between meals to help with intake. Follow up labs shows improvement in renal status. Blood pressures have been monitored on bid basis and have  been reasonably controlled on current regimen. He was found to have Enteroccocus/Proteus UTI at admission and was treated with 7 day course of amoxicillin.  Urinary retention has resolved with schedule toileting as well as addition of urecholine. He remains incontinent of urine  and repeat culture of 4/3 showed no growth.   He has had recurrent insomnia and low dose trazodone as well as melatonin has been used to improve sleep/wake cycle. He had difficulty voiding last night requiring cath for 500 cc urine.  Repeat UA was done due to complaints of dysuria and evidence of pyuria on UA. He was started on keflex empirically and urine culture pending.   He continues to be limited by moderate to severe dysarthria with dysphagia and left hemiparesis with fluctuating levels of endurance.  He currently requires moderate to total assist and family has elected on SNF for progressive therapy. Patient was discharged to [facility] on 03/18/17.   Rehab course: During patient's stay in rehab weekly team conferences were held to monitor patient's progress, set goals and discuss barriers to discharge. At admission, patient required total assist with basic self care tasks and mobility. He displayed decreased ability to mange secretions needing max cues to management of saliva. He required moderate to max cues to maintain attention as well as lethargy affecting cognition. He has had improvement in activity tolerance, balance, postural control, as well as ability to compensate for deficits.  He is able to complete ADL tasks with mod total assist of 2. He requires max assist to propel his wheelchair with modified hemi technique. He is able to perform sit to stand transfer with mod assist and cues for upright posture. He requires max verbal cues with max assist to maintain attention to tasks.     Disposition: [place].   Diet: Low salt. Dysphagia 1, nectar liquids.   Special Instructions: 1. 1/2 Normal saline at 50 cc/hr for 12 hours daily. ( 7 pm to 7 am) 2. Toilet patient to every 4 hours while awake. Cath if no void in 8 hours or for volumes greater than 350 cc.  3. Offer supplements between meals.  4. Needs full supervision at meals. Check for left pocketing. Oral care after  meals.    Discharge Instructions    Ambulatory referral to Physical Medicine Rehab    Complete by:  As directed    4-6 weeks for follow up appt       Allergies as of 03/18/2017      Reactions   Tape Other (See Comments)   SKIN IS THIN (MAY TEAR AND/OR BRUISE EASILY)      Medication List    STOP taking these medications   polyethylene glycol powder powder Commonly known as:  GLYCOLAX/MIRALAX     TAKE these medications   acetaminophen 325 MG tablet Commonly known as:  TYLENOL Take 1-2 tablets (325-650 mg total) by mouth every 4 (four) hours as needed for mild pain.   amLODipine 5 MG tablet Commonly known as:  NORVASC Take 1 tablet (5 mg total) by mouth daily. Start taking on:  03/19/2017   cephALEXin 250 MG/5ML suspension Commonly known as:  KEFLEX Take 5 mLs (250 mg total) by mouth every 8 (eight) hours.   chlorhexidine 0.12 % solution Commonly known as:  PERIDEX 15 mLs by Mouth Rinse route 2 (two) times daily.   lisinopril 20 MG tablet Commonly known as:  PRINIVIL,ZESTRIL Take 1 tablet (20 mg total) by mouth 2 (two) times daily.  Melatonin 3 MG Tabs Take 1 tablet (3 mg total) by mouth at bedtime.   methylphenidate 10 MG tablet Commonly known as:  RITALIN Take 1 tablet (10 mg total) by mouth 2 (two) times daily with breakfast and lunch.   mouth rinse Liqd solution 15 mLs by Mouth Rinse route 2 times daily at 12 noon and 4 pm.   multivitamin with minerals Tabs tablet Take 1 tablet by mouth daily. Start taking on:  03/19/2017   nitroGLYCERIN 2 % ointment Commonly known as:  NITROGLYN Apply 0.5 inches topically every 6 (six) hours as needed (SBP >180 or DBP> 100).   pantoprazole sodium 40 mg/20 mL Pack Commonly known as:  PROTONIX Take 20 mLs (40 mg total) by mouth daily.   potassium chloride 20 MEQ/15ML (10%) Soln Take 7.5 mLs (10 mEq total) by mouth 2 (two) times daily.   RESOURCE THICKENUP CLEAR Powd Take 1 g by mouth as needed.   sennosides 8.8  MG/5ML syrup Commonly known as:  SENOKOT Take 10 mLs by mouth at bedtime.   sodium chloride 0.45 % solution Inject 50 mLs into the vein daily. From 7 pm to 7 am for hydration.   sodium phosphate 7-19 GM/118ML Enem Place 133 mLs (1 enema total) rectally once as needed for severe constipation.   tamsulosin 0.4 MG Caps capsule Commonly known as:  FLOMAX Take 1 capsule (0.4 mg total) by mouth daily after supper.   traZODone 50 MG tablet Commonly known as:  DESYREL Take 0.5 tablets (25 mg total) by mouth at bedtime as needed for sleep.        Contact information for follow-up providers    Meredith Staggers, MD Follow up.   Specialty:  Physical Medicine and Rehabilitation Contact information: [address] [place] [address] [phone]        Antony Contras, MD Follow up.   Specialties:  Neurology, Radiology Contact information: [address] [place] [address] [phone]            Contact information for after-discharge care    [facility] .   Specialty:  [place] information: [address] [place] [phone]                  Signed: Bary Leriche 03/18/2017, 12:58 PM

## 2017-03-17 NOTE — Progress Notes (Addendum)
[facility] PHYSICAL MEDICINE & REHABILITATION     PROGRESS NOTE    Subjective/Complaints: Up in chair. Very alert. Denies pain or problems.   ROS: pt denies nausea, vomiting, diarrhea, cough, shortness of breath or chest pain     Objective: Vital Signs: Blood pressure 112/70, pulse 98, temperature 97.6 F (36.4 C), temperature source Oral, resp. rate 18, weight 78.6 kg (173 lb 4.5 oz), SpO2 94 %. No results found. No results for input(s): WBC, HGB, HCT, PLT in the last 72 hours.  Recent Labs  03/15/17 0627 03/17/17 0439  NA 134* 136  K 4.6 3.8  CL 102 104  GLUCOSE 120* 110*  BUN 36* 23*  CREATININE 1.10 1.00  CALCIUM 9.1 9.2   CBG (last 3)  No results for input(s): GLUCAP in the last 72 hours.  Wt Readings from Last 3 Encounters:  03/17/17 78.6 kg (173 lb 4.5 oz)  02/23/17 85.7 kg (189 lb)    Physical Exam:  Constitutional: NAD. Vital signs reviewed.  HENT: Normocephalic and atraumatic.  Eyes: EOMI. No discharge.  Cardiovascular: RRR Respiratory: CTA B.  GI: Soft. Bowel sounds are normal.  Musculoskeletal: He exhibits no edema or tenderness.  Neurological:   Speech clearer. More alert Left facial weakness.   LUE 3-/5 with ?flexor tone at times present LLE 3+/5 Skin: Mild abrasion to knee healing Psychiatric:  Remains flat   Assessment/Plan: 1. Functional and cognitive deficits secondary to right thalamic hemorrhage which require 3+ hours per day of interdisciplinary therapy in a comprehensive inpatient rehab setting. Physiatrist is providing close team supervision and 24 hour management of active medical problems listed below. Physiatrist and rehab team continue to assess barriers to discharge/monitor patient progress toward functional and medical goals.  Function:  Bathing Bathing position Bathing activity did not occur: Safety/medical concerns (pt too lethargic to participate) Position: Other (comment) (bed LB, UB in chair at sink)  Bathing parts  Body parts bathed by patient: Right arm, Left arm, Chest, Abdomen, Front perineal area, Right upper leg, Left upper leg Body parts bathed by helper: Buttocks, Right lower leg, Left lower leg, Back  Bathing assist Assist Level:  (mod A)      Upper Body Dressing/Undressing Upper body dressing   What is the patient wearing?: Pull over shirt/dress     Pull over shirt/dress - Perfomed by patient: Put head through opening, Thread/unthread right sleeve Pull over shirt/dress - Perfomed by helper: Thread/unthread right sleeve, Thread/unthread left sleeve, Put head through opening, Pull shirt over trunk        Upper body assist Assist Level:  (total a)      Lower Body Dressing/Undressing Lower body dressing   What is the patient wearing?: Pants, Socks, Shoes       Pants- Performed by helper: Thread/unthread right pants leg, Thread/unthread left pants leg, Pull pants up/down   Non-skid slipper socks- Performed by helper: Don/doff right sock, Don/doff left sock   Socks - Performed by helper: Don/doff right sock, Don/doff left sock   Shoes - Performed by helper: Don/doff right shoe, Don/doff left shoe, Fasten right, Fasten left          Lower body assist Assist for lower body dressing: 2 Automotive engineer activity did not occur: Safety/medical concerns   Toileting steps completed by helper: Performs perineal hygiene, Adjust clothing prior to toileting, Adjust clothing after toileting    Toileting assist Assist level: Two helpers   Transfers Chair/bed transfer  Chair/bed transfer method: Squat pivot Chair/bed transfer assist level: Total assist (Pt < 25%) Chair/bed transfer assistive device: Armrests Mechanical lift: Maximove   Locomotion Ambulation Ambulation activity did not occur: Safety/medical concerns   Max distance: 53ft Assist level: 2 helpers   Oceanographer activity did not occur: Safety/medical concerns Type: Manual Max  wheelchair distance: 50 ft (RLE only) Assist Level: Supervision or verbal cues  Cognition Comprehension Comprehension assist level: Understands basic 25 - 49% of the time/ requires cueing 50 - 75% of the time  Expression Expression assist level: Expresses basic 25 - 49% of the time/requires cueing 50 - 75% of the time. Uses single words/gestures.  Social Interaction Social Interaction assist level: Interacts appropriately less than 25% of the time. May be withdrawn or combative.  Problem Solving Problem solving assist level: Solves basic 25 - 49% of the time - needs direction more than half the time to initiate, plan or complete simple activities  Memory Memory assist level: Recognizes or recalls 25 - 49% of the time/requires cueing 50 - 75% of the time   Medical Problem List and Plan: 1.  Functional deficits secondary to right thalamic hemorrhage             -continue CIR  -pt slow to progress. Will needs SNF 2.  DVT Prophylaxis/Anticoagulation: Pharmaceutical: Lovenox 3. Pain Management: Tylenol prn.  4. Mood: LCSW to follow for evaluation and support when appropriate.   5. Neuropsych: This patient is not capable of making decisions on his own behalf.  -sleep improving  -limit neurosedating meds as possible  -rx'ed UTI  -continue ritalin at 10mg  bid 6. Skin/Wound Care: routine pressure relief measures. Maintain adequate nutritional and hydration status.  7. Fluids/Electrolytes/Nutrition:   Encourage nectar thick liquids intake.   -SLP unable to safely advance him to thins  -BUN improved today.    -increased IVF at night to 75 cc/hr on 4/9--reduce to 50cc/hr tonight 8. Dementia: Monitor sleep wake cycle. Maintain consistent schedule.  9. HTN:  Controlled 4/8  Cont Norvasc daily with Prinivil bid--titrate as needed 10. Pre- renal azotemia:      -BUN elevated   11. Urinary retention/hx of BPH:    - I/O cath prn although pvr's low now  -continue flomax  -reduced urecholine to 10mg   TID--d/c today  -up to toilet/commode to empty/timed voids  -now voiding but incontinent---recent ucx negative 12. Anemia:  Hgb 12.9. On 4/6 13. Dysphagia: On dysphagia 1, nectar liquids with aspiration precautions.  14. Hyperglycemia: Hgb A1C- 5.5. Likely due to thickened juices for hydration.  15. 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   -weights remain stableform 78-80kg 16. Fall   On 4/7  -no substantial sequelae 17. Fever  Resolved--monitor   LOS (Days) 22 A FACE TO FACE EVALUATION WAS PERFORMED  Meredith Staggers, MD 03/17/2017 9:09 AM

## 2017-03-17 NOTE — Progress Notes (Signed)
Social Work Patient ID: Anthony Fisher, male   DOB: 1926/09/11, 83 y.o.   MRN: [id number]   Have received SNF bed offer from [facility] (1st choice of family).  Have spoken with pt's daughter, [name], who is accepting the offer with plans to transfer pt tomorrow.  MD/PA both aware.  Alerting tx team now.  Emelio Schneller, LCSW

## 2017-03-17 NOTE — Plan of Care (Signed)
Problem: RH BLADDER ELIMINATION Goal: RH STG MANAGE BLADDER WITH EQUIPMENT WITH ASSISTANCE STG Manage Bladder With Equipment With Mod Assistance   Outcome: Not Progressing Max assist   Problem: RH COGNITION-NURSING Goal: RH STG USES MEMORY AIDS/STRATEGIES W/ASSIST TO PROBLEM SOLVE STG Uses Memory Aids/Strategies With Mod Assistance to Problem Solve.   Outcome: Not Progressing Max assist Goal: RH STG ANTICIPATES NEEDS/CALLS FOR ASSIST W/ASSIST/CUES STG Anticipates Needs/Calls for Assist With Mod Assistance/Cues.   Outcome: Not Progressing Max assist to call for assist   Problem: RH KNOWLEDGE DEFICIT Goal: RH STG INCREASE KNOWLEDGE OF HYPERTENSION Outcome: Progressing Daughter able to verbalize knowledge of hypertension Goal: RH STG INCREASE KNOWLEDGE OF DYSPHAGIA/FLUID INTAKE Outcome: Progressing Daughter verbalized understanding

## 2017-03-17 NOTE — Progress Notes (Signed)
Per nursing: did not sleep much last night despite trazodone and benadryl. Now alert and activating. Was very restless yesterday afternoon. Will increase trazodone to 50 mg hs prn. Also with low BP this am--will decrease Norvasc to 5 mg and will set parameters to hold BP meds if SBP< 120.

## 2017-03-17 NOTE — Progress Notes (Signed)
Speech Language Pathology Daily Session Note  Patient Details  Name: Anthony Fisher MRN: [id number] Date of Birth: 1926-08-26  Today's Date: 03/17/2017 SLP Individual Time: 1030-1100 SLP Individual Time Calculation (min): 30 min  Short Term Goals: Week 3: SLP Short Term Goal 1 (Week 3): Pt will demonstrate sustained attention to familiar task for ~5 minutes with Min A verbal cues.  SLP Short Term Goal 2 (Week 3): Patient will utilize an increased vocal intensity to maximize speech intelligibility to ~90% at the phrase level with supervision verbal cues.  SLP Short Term Goal 3 (Week 3): Patient will consume current diet with minimal overt s/s of aspiration with Mod A verbal cues for use of swallowing compenstory strategies.  SLP Short Term Goal 4 (Week 3): Patient will consume trials of ice chips with overt s/s of aspiration in 25% of trials or less over 2 sessions with Mod A verbal cues to assess readiness for repeat MBS.  SLP Short Term Goal 5 (Week 3): Patient will demonstrate management of secretions and will initiate swallowing his saliva in 50% of opportunities with Mod A verbal cues.   Skilled Therapeutic Interventions: Skilled treatment session focused on cognitive and dysphagia goals. Upon arrival, patient was awake while sitting upright in his wheelchair and appeared lethargic. Patient consumed a snack of Dys. 1 textures with nectar-thick liquids without overt s/s of aspiration but required Max A multimodal cues for use of small bites/sips, slow rate of self-feeding and to self-monitor and correct oral residue and anterior spillage. Patient also required Max A verbal cues for sustained attention to self-feeding task due to fatigue. Patient left upright in wheelchair with daughter present. Continue with current plan of care.      Function:  Eating Eating   Modified Consistency Diet: Yes Eating Assist Level: Set up assist for;Supervision or verbal cues;Helper checks for pocketed food   Eating Set Up Assist For: Opening containers       Cognition Comprehension Comprehension assist level: Understands basic 25 - 49% of the time/ requires cueing 50 - 75% of the time  Expression   Expression assist level: Expresses basic 25 - 49% of the time/requires cueing 50 - 75% of the time. Uses single words/gestures.  Social Interaction Social Interaction assist level: Interacts appropriately less than 25% of the time. May be withdrawn or combative.  Problem Solving Problem solving assist level: Solves basic 25 - 49% of the time - needs direction more than half the time to initiate, plan or complete simple activities  Memory Memory assist level: Recognizes or recalls 25 - 49% of the time/requires cueing 50 - 75% of the time    Pain No/Denies Pain   Therapy/Group: Individual Therapy  [place] Dresser 03/17/2017, 2:56 PM

## 2017-03-18 ENCOUNTER — Inpatient Hospital Stay (HOSPITAL_COMMUNITY): Payer: Medicare Other | Admitting: Occupational Therapy

## 2017-03-18 ENCOUNTER — Inpatient Hospital Stay (HOSPITAL_COMMUNITY): Payer: Medicare Other | Admitting: Speech Pathology

## 2017-03-18 ENCOUNTER — Inpatient Hospital Stay (HOSPITAL_COMMUNITY): Payer: Self-pay | Admitting: Physical Therapy

## 2017-03-18 LAB — URINALYSIS, ROUTINE W REFLEX MICROSCOPIC
Bilirubin Urine: NEGATIVE
GLUCOSE, UA: NEGATIVE mg/dL
Hgb urine dipstick: NEGATIVE
KETONES UR: NEGATIVE mg/dL
NITRITE: NEGATIVE
PROTEIN: 30 mg/dL — AB
SQUAMOUS EPITHELIAL / LPF: NONE SEEN
Specific Gravity, Urine: 1.009 (ref 1.005–1.030)
pH: 8 (ref 5.0–8.0)

## 2017-03-18 MED ORDER — RESOURCE THICKENUP CLEAR PO POWD: 1.0000 g | ORAL | Status: DC | PRN

## 2017-03-18 MED ORDER — NITROGLYCERIN 2 % TD OINT: 0.5000 [in_us] | TOPICAL_OINTMENT | Freq: Four times a day (QID) | TRANSDERMAL | 0 refills | Status: DC | PRN

## 2017-03-18 MED ORDER — PANTOPRAZOLE SODIUM 40 MG PO PACK: 40.0000 mg | PACK | Freq: Every day | ORAL | Status: DC

## 2017-03-18 MED ORDER — FLEET ENEMA 7-19 GM/118ML RE ENEM: 1.0000 | ENEMA | Freq: Once | RECTAL | 0 refills | Status: DC | PRN

## 2017-03-18 MED ORDER — AMLODIPINE BESYLATE 5 MG PO TABS: 5.0000 mg | ORAL_TABLET | Freq: Every day | ORAL | Status: DC

## 2017-03-18 MED ORDER — TAMSULOSIN HCL 0.4 MG PO CAPS: 0.4000 mg | ORAL_CAPSULE | Freq: Every day | ORAL | Status: DC

## 2017-03-18 MED ORDER — TRAZODONE HCL 50 MG PO TABS: 25.0000 mg | ORAL_TABLET | Freq: Every evening | ORAL | Status: DC | PRN

## 2017-03-18 MED ORDER — SODIUM CHLORIDE 0.45 % IV SOLN: 50.0000 mL | Freq: Every day | INTRAVENOUS | 0 refills | Status: DC

## 2017-03-18 MED ORDER — POTASSIUM CHLORIDE 20 MEQ/15ML (10%) PO SOLN: 10.0000 meq | Freq: Two times a day (BID) | ORAL | 0 refills | Status: DC

## 2017-03-18 MED ORDER — METHYLPHENIDATE HCL 10 MG PO TABS: 10.0000 mg | ORAL_TABLET | Freq: Two times a day (BID) | ORAL | 0 refills | Status: DC

## 2017-03-18 MED ORDER — SENNOSIDES 8.8 MG/5ML PO SYRP: 10.0000 mL | ORAL_SOLUTION | Freq: Every day | ORAL | 0 refills | Status: DC

## 2017-03-18 MED ORDER — ADULT MULTIVITAMIN W/MINERALS CH: 1.0000 | ORAL_TABLET | Freq: Every day | ORAL | Status: DC

## 2017-03-18 MED ORDER — CEPHALEXIN 250 MG/5ML PO SUSR
250.0000 mg | Freq: Three times a day (TID) | ORAL | Status: DC
  Filled 2017-03-18 (×2): qty 5

## 2017-03-18 MED ORDER — CEPHALEXIN 250 MG PO CAPS
500.0000 mg | ORAL_CAPSULE | Freq: Once | ORAL | Status: AC
  Administered 2017-03-18: 500 mg via ORAL
  Filled 2017-03-18: qty 2

## 2017-03-18 MED ORDER — CHLORHEXIDINE GLUCONATE 0.12 % MT SOLN: 15.0000 mL | Freq: Two times a day (BID) | OROMUCOSAL | 0 refills | Status: DC

## 2017-03-18 MED ORDER — CEPHALEXIN 250 MG/5ML PO SUSR: 250.0000 mg | Freq: Three times a day (TID) | ORAL | 0 refills | Status: DC

## 2017-03-18 MED ORDER — ORAL CARE MOUTH RINSE: 15.0000 mL | Freq: Two times a day (BID) | OROMUCOSAL | 0 refills | Status: DC

## 2017-03-18 MED ORDER — ACETAMINOPHEN 325 MG PO TABS: 325.0000 mg | ORAL_TABLET | ORAL | Status: DC | PRN

## 2017-03-18 MED ORDER — MELATONIN 3 MG PO TABS: 3.0000 mg | ORAL_TABLET | Freq: Every day | ORAL | 0 refills | Status: DC

## 2017-03-18 MED ORDER — LISINOPRIL 20 MG PO TABS: 20.0000 mg | ORAL_TABLET | Freq: Two times a day (BID) | ORAL | Status: DC

## 2017-03-18 NOTE — Progress Notes (Signed)
Speech Language Pathology Session Note & Discharge Summary  Patient Details  Name: Anthony Fisher MRN: [id number] Date of Birth: [birth year]  Today's Date: 03/18/2017 SLP Individual Time: 0730-0825 SLP Individual Time Calculation (min): 55 min   Skilled Therapeutic Interventions:  Skilled treatment session focused on cognition and dysphagia goals. Upon arrival, patient was asleep while supine in bed and required Max verbal and tactile cues for arousal/alertness. Although awake, patient appeared lethargic with limited verbal output throughout session. Patient consumed breakfast meal of Dys. 1 textures with nectar-thick liquids without overt s/s of aspiration but required Max A verbal cues for swallow initiation, a slow rate of self-feeding and to self-monitor and correct oral residue and anterior spillage. Recommend patient remain on current diet. Patient left upright in bed with daughter present to complete breakfast meal. Continue with current plan of care.    Patient has met 2 of 4 long term goals.  Patient to discharge at overall Mod;Max level.   Reasons goals not met: Patient continues to require Max A multimodal cues for use of swallowing compensatory strategies.    Clinical Impression/Discharge Summary: Patient has made inconsistent and minimal gains and has met 2 of 4 LTG's this admission. Currently, patient requires Mod A verbal cues to achieve ~75% intelligibility at the phrase level and for sustained attention to functional tasks. Patient is currently consuming Dys. 1 textures with nectar-thick liquids with minimal overt s/s of aspiration but requires Max A multimodal cues for use of swallowing compensatory strategies. Trials of thin liquids and Dys. 2 textures have been administered with consistent overt s/s of aspiration, therefore, patient had not been upgraded. Patient's family is unable to provide the necessary assistance needed at this time, therefore, patient will discharge to a SNF.  Patient would benefit from f/u SLP services to maximize his cognitive and swallowing function in order to maximize his overall functional independence and reduce caregiver burden.   Care Partner:  Caregiver Able to Provide Assistance: No  Type of Caregiver Assistance: Physical;Cognitive  Recommendation:  Skilled Nursing facility  Rationale for SLP Follow Up: Maximize functional communication;Maximize cognitive function and independence;Maximize swallowing safety;Reduce caregiver burden   Equipment: Thickener    Reasons for discharge: Discharged from hospital   Patient/Family Agrees with Progress Made and Goals Achieved: Yes   Function:  Eating Eating   Modified Consistency Diet: Yes Eating Assist Level: Set up assist for;Supervision or verbal cues;Helper checks for pocketed food   Eating Set Up Assist For: Opening containers       Cognition Comprehension Comprehension assist level: Understands basic 25 - 49% of the time/ requires cueing 50 - 75% of the time  Expression   Expression assist level: Expresses basic 25 - 49% of the time/requires cueing 50 - 75% of the time. Uses single words/gestures.  Social Interaction Social Interaction assist level: Interacts appropriately less than 25% of the time. May be withdrawn or combative.  Problem Solving Problem solving assist level: Solves basic 25 - 49% of the time - needs direction more than half the time to initiate, plan or complete simple activities  Memory Memory assist level: Recognizes or recalls 25 - 49% of the time/requires cueing 50 - 75% of the time   Anthony Fisher Day 03/18/2017, 10:20 AM

## 2017-03-18 NOTE — Progress Notes (Signed)
Physical Therapy Discharge Summary  Patient Details  Name: Anthony Fisher MRN: [id number] Date of Birth: 27-Sep-1926  Today's Date: 03/18/2017 PT Individual Time:1105-1200      Patient ha2s met 6 of 6 long term goals due to improved activity tolerance, improved balance, improved postural control, increased strength, improved attention and improved awareness.  Patient to discharge at a wheelchair level Max Assist.   Patient's care partner unavailable to provide the necessary physical and cognitive assistance at discharge.    Recommendation:  Patient will benefit from ongoing skilled PT services in skilled nursing facility setting to continue to advance safe functional mobility, address ongoing impairments in balance, attention, awareness, safety, gait, WC mobility, and minimize fall risk.  Equipment: No equipment provided  Reasons for discharge: treatment goals met and discharge from hospital  Patient/family agrees with progress made and goals achieved: Yes   PT Treatment: PT instructed pt in grad day assessment to measure progress toward goals. See below for results. Patient returned too room and left sitting in [facility] with call bell in reach and all needs met.     PT Discharge Precautions/Restrictions Fall     Cognition Overall Cognitive Status: Impaired/Different from baseline Orientation Level: Oriented to person Sustained Attention: Impaired Sustained Attention Impairment: Verbal basic Memory: Impaired Memory Impairment: Decreased recall of new information;Decreased short term memory Awareness: Impaired Awareness Impairment: Intellectual impairment Safety/Judgment: Impaired Sensation Sensation Light Touch: Appears Intact Coordination Gross Motor Movements are Fluid and Coordinated: No Fine Motor Movements are Fluid and Coordinated: No Motor  Motor Motor: Hemiplegia;Abnormal postural alignment and control  Mobility Bed Mobility Bed Mobility: Rolling Right;Rolling  Left;Supine to Sit;Sit to Supine Rolling Right: 4: Min assist Rolling Right Details: Verbal cues for sequencing;Verbal cues for technique;Verbal cues for precautions/safety Rolling Left: 3: Mod assist Rolling Left Details: Verbal cues for sequencing;Verbal cues for technique;Verbal cues for precautions/safety;Manual facilitation for placement Supine to Sit: 3: Mod assist Supine to Sit Details: Verbal cues for technique;Verbal cues for precautions/safety;Verbal cues for sequencing;Manual facilitation for placement Sit to Supine: 4: Min assist Sit to Supine - Details: Tactile cues for weight beaing;Manual facilitation for placement;Manual facilitation for weight shifting;Verbal cues for precautions/safety;Verbal cues for technique Transfers Transfers: Yes Sit to Stand: 3: Mod assist (at rail in hall. ) Set designer Transfers: 2: Max Risk manager Details: Verbal cues for technique;Verbal cues for precautions/safety;Manual facilitation for placement Locomotion  Ambulation Ambulation: Yes Ambulation/Gait Assistance: 1: +2 Total assist Assistive device: Other (Comment) (rail in hall) Gait Gait: Yes Gait Pattern: Lateral trunk lean to left;Step-to pattern;Poor foot clearance - left Wheelchair Mobility Wheelchair Mobility: Yes Wheelchair Assistance: 3: Mod assist Wheelchair Assistance Details: Manual facilitation for placement;Verbal cues for technique;Verbal cues for precautions/safety;Tactile cues for placement;Verbal cues for sequencing Wheelchair Propulsion: Right upper extremity;Right lower extremity Wheelchair Parts Management: Needs assistance Distance: 148f   Trunk/Postural Assessment  Cervical Assessment Cervical Assessment: Exceptions to [facility]) Thoracic Assessment Thoracic Assessment: Exceptions to [facility](rounded shoudlers, poor rotation) Lumbar Assessment Lumbar Assessment: Exceptions to [facility] MedicalPostural Control Postural Control: Deficits on  evaluation Trunk Control: L lateral lean when fatigued  Balance Balance Balance Assessed: Yes Static Sitting Balance Static Sitting - Level of Assistance: 4: Min assist Dynamic Sitting Balance Sitting balance - Comments: min assist overall. mod assist when fatigued Static Standing Balance Static Standing - Level of Assistance: 3: Mod assist Dynamic Standing Balance Dynamic Standing - Level of Assistance: 2: Max assist Extremity Assessment      RLE Assessment RLE Assessment: Exceptions  to [facility] (grossly 4/5 to 4+/5 through functional movement. ) LLE Assessment LLE Assessment: Exceptions to [facility] (Grossly 4-/5 through functional movement)   See Function Navigator for Current Functional Status.  Lorie Phenix 03/19/2017, 5:26 AM

## 2017-03-18 NOTE — Progress Notes (Signed)
Pt in and out cathed due to post void residual of 500cc. 500cc retained when catherterized. Urine foul smelling, with puss draining at the end. Pt is complaining of pain when he urinates. He grabs at himself and states that it is painful. Billy Fischer notified. An order was obtained after he came and looked at the urine and we spoke of the pts symptomlogys. Sent to the lab via tube foer ua and C & S. Pt had 0 left after in and out cathed and is resting comfortably at present.

## 2017-03-18 NOTE — Progress Notes (Signed)
Occupational Therapy Discharge Summary  Patient Details  Name: Anthony Fisher MRN: [id number] Date of Birth: Feb 19, 1926  Today's Date: 03/18/2017 OT Individual Time: 0017-4944 OT Individual Time Calculation (min): 55 min    Patient has met 4 of 9 long term goals due to improved activity tolerance, improved balance, ability to compensate for deficits, functional use of  LEFT upper extremity, improved attention, improved awareness and improved coordination.  Patient to discharge at overall mod - total of 2 level.    Reasons goals not met: Goals not met as pt is not consistent depending on level of fatigue and alertness. Pt requires total A of 2 for LB self care and toileting.   Recommendation:  Patient will benefit from ongoing skilled OT services in skilled nursing facility setting to continue to advance functional skills in the area of BADL and Reduce care partner burden.  Equipment: defer to next venue of care  Reasons for discharge: discharge from hospital  Patient/family agrees with progress made and goals achieved: Yes   OT Intervention: Upon entering the room, pt supine in bed with daughter present in room. Pt with no c/o pain this session. LB self care performed supine with pt rolling L  <> R with max A and total A to don LB clothing. Pt performed supine >sit with max A to EOB. Pt sitting on EOB for 10 minutes with min - max A for sitting balance as pt displays posterior lean and resistive to anterior weight shift. Hemiplegic technique to don pull over shirt with max multimodal cues and unable to thread L UE into shirt without assistance. Stand pivot transfer from bed >wheelchair with max A. Pt seated in wheelchair at sink with encouraged use of L UE for functional tasks. Pt seated in wheelchair with quick release belt and L lap tray in place.   OT Discharge Precautions/Restrictions  Precautions Precautions: Fall Restrictions Weight Bearing Restrictions: No Vital Signs Therapy  Vitals BP: 118/60 Pain Pain Assessment Pain Assessment: No/denies pain Pain Score: 0-No pain ADL ADL ADL Comments: refer to functional navigator  Cognition Overall Cognitive Status: Impaired/Different from baseline Arousal/Alertness: Lethargic Orientation Level: Oriented to person Focused Attention: Appears intact Sustained Attention: Impaired Sustained Attention Impairment: Verbal basic Memory: Impaired Memory Impairment: Decreased recall of new information;Decreased short term memory Awareness: Impaired Awareness Impairment: Intellectual impairment Problem Solving Impairment: Functional basic Safety/Judgment: Impaired Sensation Sensation Light Touch: Appears Intact Coordination Gross Motor Movements are Fluid and Coordinated: No Fine Motor Movements are Fluid and Coordinated: No Motor  Motor Motor: Hemiplegia;Abnormal postural alignment and control Mobility  Bed Mobility Bed Mobility: Rolling Right;Rolling Left Rolling Right: 2: Max assist Rolling Right Details: Verbal cues for sequencing;Verbal cues for technique;Verbal cues for precautions/safety Rolling Left: 2: Max assist Rolling Left Details: Verbal cues for sequencing;Verbal cues for technique;Verbal cues for precautions/safety;Manual facilitation for placement Supine to Sit: 3: Mod assist Supine to Sit Details: Verbal cues for technique;Verbal cues for precautions/safety;Verbal cues for sequencing;Manual facilitation for placement Sit to Supine: 4: Min assist Sit to Supine - Details: Tactile cues for weight beaing;Manual facilitation for placement;Manual facilitation for weight shifting;Verbal cues for precautions/safety;Verbal cues for technique Transfers Transfers: Sit to Stand Sit to Stand: 2: Max assist;1: +1 Total assist   Balance Balance Balance Assessed: Yes Static Sitting Balance Static Sitting - Level of Assistance: 3: Mod assist;4: Min assist Dynamic Sitting Balance Sitting balance - Comments:  min - max A when engaged in task Extremity/Trunk Assessment RUE Assessment RUE Assessment: Within Functional Limits LUE  Assessment LUE Assessment: Exceptions to [facility]   See Function Navigator for Current Functional Status.  Darleen Crocker P 03/18/2017, 11:45 AM

## 2017-03-18 NOTE — Progress Notes (Signed)
Patient left via ambulance service at 1518, called report to [facility] at 1525. Pt daughter gathered pt belongings to transport to the SNF. Anthony Fisher, Anthony Fisher

## 2017-03-18 NOTE — Progress Notes (Signed)
Social Work  Discharge Note  The overall goal for the admission was met for:   Discharge location: NO - plan changed to SNF as family cannot meet current level of care  Length of Stay: Yes - 23 days  Discharge activity level: No - very slow progress and did not meet most goals;  D/c plan changed to SNF  Home/community participation: No  Services provided included: MD, RD, PT, OT, SLP, RN, TR, Pharmacy and [place]: [facility] Medicare  Follow-up services arranged: Other: SNF at [facility]  Comments (or additional information):  Patient/Family verbalized understanding of follow-up arrangements: Yes  Individual responsible for coordination of the follow-up plan: daughter  Confirmed correct DME delivered: NA    Laurena Valko

## 2017-03-18 NOTE — Plan of Care (Signed)
Problem: RH Eating Goal: LTG Patient will perform eating w/assist, cues/equip (OT) LTG: Patient will perform eating with assist, with/without cues using equipment (OT)  Outcome: Not Met (add Reason) Pt requires assistance  Problem: RH Dressing Goal: LTG Patient will perform lower body dressing w/assist (OT) LTG: Patient will perform lower body dressing with assist, with/without cues in positioning using equipment (OT)  Outcome: Not Met (add Reason) Pt requires total A  Problem: RH Toileting Goal: LTG Patient will perform toileting w/assist, cues/equip (OT) LTG: Patient will perform toiletiing (clothes management/hygiene) with assist, with/without cues using equipment (OT)  Outcome: Not Met (add Reason) +2 assistance for clothing management and safety  Problem: RH Toilet Transfers Goal: LTG Patient will perform toilet transfers w/assist (OT) LTG: Patient will perform toilet transfers with assist, with/without cues using equipment (OT)  Outcome: Not Met (add Reason) +2 assistance  Problem: RH Balance Goal: LTG: Patient will maintain dynamic sitting balance (OT) LTG:  Patient will maintain dynamic sitting balance with assistance during activities of daily living (OT)  Outcome: Not Met (add Reason) Secondary to pt not consistent and needing min - max A for balance with task  Problem: RH Balance Goal: LTG Patient will maintain dynamic standing with ADLs (OT) LTG:  Patient will maintain dynamic standing balance with assist during activities of daily living (OT)   Outcome: Not Met (add Reason) +2 assist for safety

## 2017-03-18 NOTE — Progress Notes (Signed)
[place] PHYSICAL MEDICINE & REHABILITATION     PROGRESS NOTE    Subjective/Complaints: Generally has been sleeping well. Didn't sleep as much Tuesday night. Received higher dose of trazodone last night and is slow to arouse this morning.   ROS: Limited due cognitive/behavioral    Objective: Vital Signs: Blood pressure 118/60, pulse 82, temperature 98.5 F (36.9 C), temperature source Oral, resp. rate 16, weight 81.6 kg (179 lb 14.4 oz), SpO2 98 %. No results found. No results for input(s): WBC, HGB, HCT, PLT in the last 72 hours.  Recent Labs  03/17/17 0439  NA 136  K 3.8  CL 104  GLUCOSE 110*  BUN 23*  CREATININE 1.00  CALCIUM 9.2   CBG (last 3)  No results for input(s): GLUCAP in the last 72 hours.  Wt Readings from Last 3 Encounters:  03/18/17 81.6 kg (179 lb 14.4 oz)  02/23/17 85.7 kg (189 lb)    Physical Exam:  Constitutional: NAD. Vital signs reviewed.  HENT: Normocephalic and atraumatic.  Eyes: EOMI. No discharge.  Cardiovascular: RRR Respiratory: CTA B.  GI: Soft. Bowel sounds are normal.  Musculoskeletal: He exhibits no edema or tenderness.  Neurological:   Speech dysarthric. Less alert today Left facial weakness.   LUE 3-/5 with ?flexor tone at times present LLE 3+/5 Skin: intact Psychiatric:  Remains flat   Assessment/Plan: 1. Functional and cognitive deficits secondary to right thalamic hemorrhage which require 3+ hours per day of interdisciplinary therapy in a comprehensive inpatient rehab setting. Physiatrist is providing close team supervision and 24 hour management of active medical problems listed below. Physiatrist and rehab team continue to assess barriers to discharge/monitor patient progress toward functional and medical goals.  Function:  Bathing Bathing position Bathing activity did not occur: Safety/medical concerns (pt too lethargic to participate) Position: Other (comment) (bed LB, UB in chair at sink)  Bathing parts Body  parts bathed by patient: Right arm, Left arm, Chest, Abdomen, Front perineal area, Right upper leg, Left upper leg Body parts bathed by helper: Buttocks, Right lower leg, Left lower leg, Back  Bathing assist Assist Level:  (mod A)      Upper Body Dressing/Undressing Upper body dressing   What is the patient wearing?: Pull over shirt/dress     Pull over shirt/dress - Perfomed by patient: Put head through opening, Thread/unthread right sleeve Pull over shirt/dress - Perfomed by helper: Thread/unthread right sleeve, Thread/unthread left sleeve, Put head through opening, Pull shirt over trunk        Upper body assist Assist Level:  (total a)      Lower Body Dressing/Undressing Lower body dressing   What is the patient wearing?: Pants, Socks, Shoes       Pants- Performed by helper: Thread/unthread right pants leg, Thread/unthread left pants leg, Pull pants up/down   Non-skid slipper socks- Performed by helper: Don/doff right sock, Don/doff left sock   Socks - Performed by helper: Don/doff right sock, Don/doff left sock   Shoes - Performed by helper: Don/doff right shoe, Don/doff left shoe, Fasten right, Fasten left          Lower body assist Assist for lower body dressing: 2 Automotive engineer activity did not occur: Safety/medical concerns   Toileting steps completed by helper: Performs perineal hygiene, Adjust clothing prior to toileting, Adjust clothing after toileting    Toileting assist Assist level: Two helpers   Transfers Chair/bed transfer   Chair/bed transfer method: Squat pivot Chair/bed  transfer assist level: Total assist (Pt < 25%) Chair/bed transfer assistive device: Armrests Mechanical lift: Maximove   Locomotion Ambulation Ambulation activity did not occur: Safety/medical concerns   Max distance: 70ft  Assist level: 2 helpers   Oceanographer activity did not occur: Safety/medical concerns Type: Manual Max wheelchair  distance: 115ft Assist Level: Maximal assistance (Pt 25 - 49%)  Cognition Comprehension Comprehension assist level: Understands basic 25 - 49% of the time/ requires cueing 50 - 75% of the time  Expression Expression assist level: Expresses basic 25 - 49% of the time/requires cueing 50 - 75% of the time. Uses single words/gestures.  Social Interaction Social Interaction assist level: Interacts appropriately less than 25% of the time. May be withdrawn or combative.  Problem Solving Problem solving assist level: Solves basic 25 - 49% of the time - needs direction more than half the time to initiate, plan or complete simple activities  Memory Memory assist level: Recognizes or recalls 25 - 49% of the time/requires cueing 50 - 75% of the time   Medical Problem List and Plan: 1.  Functional deficits secondary to right thalamic hemorrhage             -   -SNF today  -follow up with me in 4-6 weeks 2.  DVT Prophylaxis/Anticoagulation: Pharmaceutical: Lovenox 3. Pain Management: Tylenol prn.  4. Mood: LCSW to follow for evaluation and support when appropriate.   5. Neuropsych: This patient is not capable of making decisions on his own behalf.  -sleep improving  -limit neurosedating meds as possible---would limit trazodone to 25mg   -rx'ed UTI  -continue ritalin at 10mg  bid 6. Skin/Wound Care: routine pressure relief measures. Maintain adequate nutritional and hydration status.  7. Fluids/Electrolytes/Nutrition:   Encourage nectar thick liquids intake.   -SLP unable to safely advance him to thins  -BUN improved today.    -I would maintain IVF at 50cc for 12 hours each night until fluids upgraded 8. Dementia: Monitor sleep wake cycle. Maintain consistent schedule.  9. HTN:  Controlled 4/8  Cont Norvasc daily with Prinivil bid--titrate as needed 10. Pre- renal azotemia:      -BUN elevated   11. Urinary retention/hx of BPH:    - I/O cath prn although pvr's low now  -continue flomax  -reduced  urecholine to 10mg  TID--d/c today  -up to toilet/commode to empty/timed voids  -now voiding but incontinent---recent ucx negative 12. Anemia:  Hgb 12.9. On 4/6 13. Dysphagia: On dysphagia 1, nectar liquids with aspiration precautions.  14. Hyperglycemia: Hgb A1C- 5.5. Likely due to thickened juices for hydration.  15. 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   -weights remain stableform 78-80kg 16. Fall   On 4/7  -no substantial sequelae 17. Fever  Resolved--monitor   LOS (Days) 23 A FACE TO FACE EVALUATION WAS PERFORMED  Alger Simons T, MD 03/18/2017 9:00 AM

## 2017-03-19 ENCOUNTER — Non-Acute Institutional Stay (SKILLED_NURSING_FACILITY): Payer: Medicare Other | Admitting: Internal Medicine

## 2017-03-19 ENCOUNTER — Encounter: Payer: Self-pay | Admitting: Internal Medicine

## 2017-03-19 DIAGNOSIS — R339 Retention of urine, unspecified: Secondary | ICD-10-CM | POA: Diagnosis not present

## 2017-03-19 DIAGNOSIS — R410 Disorientation, unspecified: Secondary | ICD-10-CM | POA: Diagnosis not present

## 2017-03-19 DIAGNOSIS — M546 Pain in thoracic spine: Secondary | ICD-10-CM

## 2017-03-19 DIAGNOSIS — I10 Essential (primary) hypertension: Secondary | ICD-10-CM | POA: Diagnosis not present

## 2017-03-19 DIAGNOSIS — N3000 Acute cystitis without hematuria: Secondary | ICD-10-CM | POA: Diagnosis not present

## 2017-03-19 DIAGNOSIS — G4700 Insomnia, unspecified: Secondary | ICD-10-CM

## 2017-03-19 DIAGNOSIS — I69391 Dysphagia following cerebral infarction: Secondary | ICD-10-CM | POA: Diagnosis not present

## 2017-03-19 DIAGNOSIS — R638 Other symptoms and signs concerning food and fluid intake: Secondary | ICD-10-CM

## 2017-03-19 DIAGNOSIS — R2681 Unsteadiness on feet: Secondary | ICD-10-CM

## 2017-03-19 DIAGNOSIS — K5901 Slow transit constipation: Secondary | ICD-10-CM

## 2017-03-19 DIAGNOSIS — M7989 Other specified soft tissue disorders: Secondary | ICD-10-CM | POA: Diagnosis not present

## 2017-03-19 DIAGNOSIS — I610 Nontraumatic intracerebral hemorrhage in hemisphere, subcortical: Secondary | ICD-10-CM | POA: Diagnosis not present

## 2017-03-19 DIAGNOSIS — R531 Weakness: Secondary | ICD-10-CM

## 2017-03-19 DIAGNOSIS — K219 Gastro-esophageal reflux disease without esophagitis: Secondary | ICD-10-CM

## 2017-03-19 LAB — URINE CULTURE

## 2017-03-19 NOTE — Progress Notes (Signed)
LOCATION: [facility]  PCP: Myriam Jacobson, MD   Code Status: Full Code  Goals of care: Advanced Directive information Advanced Directives 02/24/2017  Does Patient Have a Medical Advance Directive? No  Would patient like information on creating a medical advance directive? No - Patient declined       Extended Emergency Contact Information Primary Emergency Contact: [name] Phone: [phone] Relation: Daughter Secondary Emergency Contact: [name],Max  [place] Mobile Phone: [phone] Relation: Son   Allergies  Allergen Reactions  . Tape Other (See Comments)    SKIN IS THIN (MAY TEAR AND/OR BRUISE EASILY)    Chief Complaint  Patient presents with  . New Admit To SNF    New Admission Visit      HPI:  Patient is a 83 y.o. male seen today for short term rehabilitation post hospital admission from 02/15/2017-02/23/2017 with left-sided weakness post fall. CT head showed right thalamic hemorrhage with intraventricular hemorrhage and this was likely in setting of hypertensive emergency. Neurology was consulted. CT angiogram of head and neck was unremarkable. Echocardiogram showed ejection fraction 60-65%. LDL was 70. No antithrombotic or anti-platelet agent was started given his bleed. He was seen by physical therapy, occupational therapy and speech therapy in the hospital and placed on dysphagia diet. He had acute urinary retention and required Foley catheter. He was then transferred to inpatient rehabilitation center and underwent rehabilitation from 02/23/2017-03/18/2017. In the inpatient rehabilitation center he was treated for urinary tract infection with antibiotic. Prior to his discharge there was concern for another urinary tract infection and he is currently on antibiotic being empirically treated for possible urinary tract infection. He had increased somnolence and poor oral intake during his  rehabilitation stay. He was placed on IV fluids. He has medical history of dementia and he is seen in his room today with his daughter present at bedside. Per daughter, he was independent prior to this hospitalization with his activities of daily living besides driving. Per nursing staff, patient had a fall last night. Medical Provider was not notified about the fall. This morning daughter his left arm to be swollen and him complaining of back pain both of which are new findings.  Review of Systems: From daughter as patient is fast asleep Constitutional: Negative for fever, chills, diaphoresis.  HENT: Negative for headache, congestion, nasal discharge. Daughter has noticed his need to clear his throat during swallowing.   Eyes: Negative for eye pain and discharge.  Respiratory: Negative for cough, shortness of breath..  Cardiovascular: Negative for chest pain, palpitations, leg swelling.  Gastrointestinal: Negative for heartburn, nausea, vomiting, abdominal pain. Appetite has been poor. Last bowel movement was yesterday. Genitourinary: Negative for dysuria. Musculoskeletal: Positive for back pain.left-sided weakness post stroke Skin: Negative for itching, rash.  Neurological: Negative for dizziness. Psychiatric/Behavioral: Increased agitation and restlessness after 5 PM and patient stays awake for most part of the night. Increased daytime sleepiness.   Past Medical History:  Diagnosis Date  . Adenomatous polyp of colon   . Dementia    Past Surgical History:  Procedure Laterality Date  . CATARACT EXTRACTION W/ INTRAOCULAR LENS  IMPLANT, BILATERAL     Social History:   reports that he has never smoked. He has never used smokeless tobacco. He reports that he does not drink alcohol or use drugs.  No family history on file.  Medications: Allergies as of 03/19/2017      Reactions   Tape Other (  See Comments)   SKIN IS THIN (MAY TEAR AND/OR BRUISE EASILY)      Medication List         Accurate as of 03/19/17 10:46 AM. Always use your most recent med list.          acetaminophen 325 MG tablet Commonly known as:  TYLENOL Take 1-2 tablets (325-650 mg total) by mouth every 4 (four) hours as needed for mild pain.   amLODipine 5 MG tablet Commonly known as:  NORVASC Take 1 tablet (5 mg total) by mouth daily.   cephALEXin 250 MG/5ML suspension Commonly known as:  KEFLEX Take 5 mLs (250 mg total) by mouth every 8 (eight) hours.   CERTAVITE/ANTIOXIDANTS Tabs Take 1 tablet by mouth daily.   chlorhexidine 0.12 % solution Commonly known as:  PERIDEX 15 mLs by Mouth Rinse route 2 (two) times daily.   lisinopril 20 MG tablet Commonly known as:  PRINIVIL,ZESTRIL Take 1 tablet (20 mg total) by mouth 2 (two) times daily.   Melatonin 3 MG Tabs Take 1 tablet (3 mg total) by mouth at bedtime.   nitroGLYCERIN 2 % ointment Commonly known as:  NITROGLYN Apply 0.5 inches topically every 6 (six) hours as needed (SBP >180 or DBP> 100).   pantoprazole sodium 40 mg/20 mL Pack Commonly known as:  PROTONIX Take 20 mLs (40 mg total) by mouth daily.   potassium chloride 20 MEQ/15ML (10%) Soln Take 7.5 mLs (10 mEq total) by mouth 2 (two) times daily.   RESOURCE THICKENUP CLEAR Powd Take 1 g by mouth as needed.   sennosides 8.8 MG/5ML syrup Commonly known as:  SENOKOT Take 10 mLs by mouth at bedtime.   sodium chloride 0.45 % solution Inject 50 mLs into the vein daily. From 7 pm to 7 am for hydration.   sodium phosphate 7-19 GM/118ML Enem Place 133 mLs (1 enema total) rectally once as needed for severe constipation.   tamsulosin 0.4 MG Caps capsule Commonly known as:  FLOMAX Take 1 capsule (0.4 mg total) by mouth daily after supper.   traZODone 50 MG tablet Commonly known as:  DESYREL Take 0.5 tablets (25 mg total) by mouth at bedtime as needed for sleep.       Immunizations: Immunization History  Administered Date(s) Administered  . PPD Test 03/19/2017      Physical Exam: Vitals:   03/19/17 1040  BP: (!) 142/78  Pulse: 87  Resp: 18  Temp: 98.7 F (37.1 C)  TempSrc: Oral  SpO2: 95%  Weight: 179 lb 14.4 oz (81.6 kg)  Height: 5\' 9"  (1.753 m)   Body mass index is 26.57 kg/m.  General- elderly male, frail, well built, in no acute distress Head- normocephalic, atraumatic Nose- no maxillary or frontal sinus tenderness, no nasal discharge Throat- moist mucus membrane, normal oropharynx, missing teeth  Eyes- PERRLA, EOMI, no pallor, no icterus, no discharge, normal conjunctiva, normal sclera Neck- no cervical lymphadenopathy Cardiovascular- normal s1,s2, no murmur Respiratory- bilateral clear to auscultation, no wheeze, no rhonchi, no crackles, no use of accessory muscles Abdomen- bowel sounds present, soft, non tender, no guarding or rigidity Musculoskeletal- left hemiparesis with strength 3 out of 5 in left upper and lower extremities, left arm swollen, good radial pulse, no leg edema, no reproducible chest pain, unable to assess his spine  Neurological- unable to assess Skin- warm and dry, resolving bruise to both arms    Labs reviewed: Basic Metabolic Panel:  Recent Labs  02/17/17 1710 02/18/17 0446 02/18/17 1822  03/12/17  1191 03/15/17 0627 03/17/17 0439  NA  --  137  --   < > 136 134* 136  K  --  3.1*  --   < > 4.4 4.6 3.8  CL  --  106  --   < > 102 102 104  CO2  --  22  --   < > 23 21* 23  GLUCOSE  --  125*  --   < > 109* 120* 110*  BUN  --  13  --   < > 33* 36* 23*  CREATININE  --  0.75  --   < > 1.18 1.10 1.00  CALCIUM  --  8.5*  --   < > 9.7 9.1 9.2  MG 1.9 1.9 2.3  --   --   --   --   PHOS 2.3* 2.3* 1.7*  --   --   --   --   < > = values in this interval not displayed. Liver Function Tests:  Recent Labs  02/15/17 1924 02/21/17 1415 02/24/17 1010  AST 18 43* 55*  ALT 8* 26 46  ALKPHOS 63 45 46  BILITOT 0.6 0.9 1.1  PROT 6.2* 6.1* 6.8  ALBUMIN 3.9 3.3* 3.8   No results for input(s): LIPASE,  AMYLASE in the last 8760 hours. No results for input(s): AMMONIA in the last 8760 hours. CBC:  Recent Labs  02/15/17 1924  02/24/17 1010 02/26/17 0607 03/12/17 0648  WBC 5.8  < > 13.7* 10.5 6.3  NEUTROABS 2.6  --  10.8*  --   --   HGB 12.9*  < > 12.9* 11.7* 11.9*  HCT 37.5*  < > 38.7* 35.2* 34.8*  MCV 96.2  < > 97.7 98.9 97.8  PLT 206  < > 288 266 248  < > = values in this interval not displayed. Cardiac Enzymes: No results for input(s): CKTOTAL, CKMB, CKMBINDEX, TROPONINI in the last 8760 hours. BNP: Invalid input(s): POCBNP CBG:  Recent Labs  02/23/17 0759 02/23/17 1137 02/23/17 1626  GLUCAP 98 139* 104*    Radiological Exams: Dg Chest 2 View  Result Date: 02/24/2017 CLINICAL DATA:  Left-sided weakness and slurred speech EXAM: CHEST  2 VIEW COMPARISON:  02/22/2017 FINDINGS: Feeding tube removed. Cardiomegaly. Bibasilar atelectasis versus airspace disease left greater than right is stable. Tiny pleural effusions are suspected. No pneumothorax. IMPRESSION: Stable bibasilar atelectasis versus airspace disease. Electronically Signed   By: Marybelle Killings M.D.   On: 02/24/2017 08:54   Mr Brain Wo Contrast  Result Date: 02/18/2017 CLINICAL DATA:  LEFT-sided weakness February 15, 2017. Follow-up RIGHT thalamic hemorrhage. History of dementia. EXAM: MRI HEAD WITHOUT CONTRAST TECHNIQUE: Multiplanar, multiecho pulse sequences of the brain and surrounding structures were obtained without intravenous contrast. COMPARISON:  CT HEAD February 16, 2017 FINDINGS: BRAIN: RIGHT thalamic susceptibility artifact corresponding to acute hemorrhage with similar regional mass effect. Surrounding FLAIR T2 hyperintense vasogenic edema. Intraventricular extension with layering blood products in the occipital horns. Chronic LEFT thalamus microhemorrhage. No reduced diffusion to suggest acute ischemia. Moderate ventriculomegaly with disproportionate temporal lobe atrophy. Patchy supratentorial and pontine white  matter FLAIR T2 hyperintensities. Old RIGHT basal ganglia lacunar infarct. VASCULAR: Normal major intracranial vascular flow voids present at skull base. SKULL AND UPPER CERVICAL SPINE: No abnormal sellar expansion. No suspicious calvarial bone marrow signal. Craniocervical junction maintained. SINUSES/ORBITS: Lobulated RIGHT maxillary sinus mucosal thickening. Mastoid air cells are well aerated. The included ocular globes and orbital contents are non-suspicious. Status post bilateral ocular lens  implants. OTHER: None. IMPRESSION: Evolving RIGHT thalamic hemorrhage with intraventricular extension. Old RIGHT caudate lacunar. Mild chronic small vessel ischemic disease. Advanced temporal lobe atrophy associated with neurodegenerative disorder. Electronically Signed   By: Elon Alas M.D.   On: 02/18/2017 23:25   Dg Chest Port 1 View  Result Date: 02/22/2017 CLINICAL DATA:  Chest congestion . EXAM: PORTABLE CHEST 1 VIEW COMPARISON:  02/21/2017 . FINDINGS: Feeding tube noted with its tip below left hemidiaphragm. Cardiomegaly with mild bilateral interstitial prominence and small bilateral pleural effusions consistent with CHF. No pneumothorax . IMPRESSION: 1.  Feeding tube noted with tip below left hemidiaphragm. 2. Cardiomegaly with diffuse bilateral from interstitial prominence and small bilateral pleural effusions consistent with mild CHF. 3. Persistent mild basilar subsegmental atelectasis . Electronically Signed   By: Marcello Moores  Register   On: 02/22/2017 07:13   Dg Chest Port 1 View  Result Date: 02/21/2017 CLINICAL DATA:  Chest congestion. EXAM: PORTABLE CHEST 1 VIEW COMPARISON:  02/16/2017. FINDINGS: Feeding tube extending into the stomach. Stable mildly enlarged cardiac silhouette. Aortic arch calcifications. Mildly increased opacity at both lung bases. Stable prominence of the interstitial markings. Diffuse osteopenia. IMPRESSION: 1. Mildly increased mild bibasilar atelectasis. 2. Possible small  bilateral pleural effusions. 3. Aortic atherosclerosis. Electronically Signed   By: Claudie Revering M.D.   On: 02/21/2017 08:34   Dg Abd Portable 1v  Result Date: 02/19/2017 CLINICAL DATA:  Feeding tube placement EXAM: PORTABLE ABDOMEN - 1 VIEW COMPARISON:  February 17, 2017 FINDINGS: Feeding tube tip is at the fourth portion of the duodenum. There is no bowel dilatation or air-fluid level suggesting bowel obstruction. No free air. Visualized lung bases are clear. IMPRESSION: Feeding tube tip is in the fourth portion of the duodenum. Bowel gas pattern unremarkable. Electronically Signed   By: Lowella Grip III M.D.   On: 02/19/2017 17:12   Dg Abd Portable 1v  Result Date: 02/17/2017 CLINICAL DATA:  Check feeding catheter placement EXAM: PORTABLE ABDOMEN - 1 VIEW COMPARISON:  None. FINDINGS: Feeding catheter is noted just beyond the gastroesophageal junction directed towards the fundus. Degenerative changes of lumbar spine are seen. Nonobstructive bowel gas pattern is noted. IMPRESSION: Feeding catheter as described. Electronically Signed   By: Inez Catalina M.D.   On: 02/17/2017 16:55   Dg Swallowing Func-speech Pathology  Result Date: 02/22/2017 Objective Swallowing Evaluation: Type of Study: MBS-Modified Barium Swallow Study Patient Details Name: SAHEJ SCHRIEBER MRN: [id number] Date of Birth: 1926/10/27 Today's Date: 02/22/2017 Time: SLP Start Time (ACUTE ONLY): 1355-SLP Stop Time (ACUTE ONLY): 1412 SLP Time Calculation (min) (ACUTE ONLY): 17 min Past Medical History: Past Medical History: Diagnosis Date . Dementia  Past Surgical History: No past surgical history on file. HPI: Pt is a 83 y.o. male who presents from home to ED on 02/15/17 with L-sided weakness. CT shows intraparenchymal hematoma within R thalamus extending into R lateral ventricle. Pertinent PMH includes dementia.  Subjective: pt drowsy Assessment / Plan / Recommendation CHL IP CLINICAL IMPRESSIONS 02/22/2017 Clinical Impression Pt has a  moderate oral and mild pharyngeal dysphagia due to sensorimotor but also cognitive deficits, with Mod cues provided for sustained arousal during study. Pt has limited labial seal that results in anterior loss that is most significant with cup sips of liquids. He has better oral acceptance with use of a straw, although he still has decreased bolus cohesion, weak lingual propulsion, piecemeal swallowing, and premature spillage into the pharynx. As a result, larger volumes of thin liquids are deeply and silently  penetrated. Cued cough is too weak to clear penetrates, increasing likelihood of aspiration. Only mild residue remains orally and in the vallecula post-swallow. Recommend initiation of Dys 1 diet and nectar thick liquids by straw. Will continue to follow for tolerance and therapeutic trials of water/ice with SLP.  SLP Visit Diagnosis Dysphagia, oropharyngeal phase (R13.12) Attention and concentration deficit following -- Frontal lobe and executive function deficit following -- Impact on safety and function Mild aspiration risk;Moderate aspiration risk   CHL IP TREATMENT RECOMMENDATION 02/22/2017 Treatment Recommendations Therapy as outlined in treatment plan below   Prognosis 02/22/2017 Prognosis for Safe Diet Advancement Good Barriers to Reach Goals Severity of deficits;Cognitive deficits Barriers/Prognosis Comment -- CHL IP DIET RECOMMENDATION 02/22/2017 SLP Diet Recommendations Dysphagia 1 (Puree) solids;Nectar thick liquid Liquid Administration via Straw Medication Administration Crushed with puree Compensations Slow rate;Small sips/bites;Minimize environmental distractions;Follow solids with liquid Postural Changes Remain semi-upright after after feeds/meals (Comment);Seated upright at 90 degrees   CHL IP OTHER RECOMMENDATIONS 02/22/2017 Recommended Consults -- Oral Care Recommendations Oral care BID Other Recommendations Order thickener from pharmacy;Prohibited food (jello, ice cream, thin soups);Remove water  pitcher;Have oral suction available   CHL IP FOLLOW UP RECOMMENDATIONS 02/22/2017 Follow up Recommendations Inpatient Rehab   CHL IP FREQUENCY AND DURATION 02/22/2017 Speech Therapy Frequency (ACUTE ONLY) min 2x/week Treatment Duration 2 weeks      CHL IP ORAL PHASE 02/22/2017 Oral Phase Impaired Oral - Pudding Teaspoon -- Oral - Pudding Cup -- Oral - Honey Teaspoon -- Oral - Honey Cup -- Oral - Nectar Teaspoon Left anterior bolus loss;Weak lingual manipulation;Reduced posterior propulsion;Delayed oral transit;Decreased bolus cohesion;Premature spillage Oral - Nectar Cup -- Oral - Nectar Straw Left anterior bolus loss;Weak lingual manipulation;Reduced posterior propulsion;Delayed oral transit;Decreased bolus cohesion;Premature spillage;Piecemeal swallowing;Left pocketing in lateral sulci Oral - Thin Teaspoon Left anterior bolus loss;Weak lingual manipulation;Reduced posterior propulsion;Delayed oral transit;Decreased bolus cohesion;Premature spillage Oral - Thin Cup Left anterior bolus loss;Weak lingual manipulation;Decreased bolus cohesion;Reduced posterior propulsion;Other (Comment) Oral - Thin Straw Left anterior bolus loss;Weak lingual manipulation;Reduced posterior propulsion;Delayed oral transit;Decreased bolus cohesion;Premature spillage;Piecemeal swallowing;Left pocketing in lateral sulci Oral - Puree Left anterior bolus loss;Weak lingual manipulation;Reduced posterior propulsion;Delayed oral transit;Decreased bolus cohesion;Premature spillage;Piecemeal swallowing;Left pocketing in lateral sulci Oral - Mech Soft -- Oral - Regular -- Oral - Multi-Consistency -- Oral - Pill -- Oral Phase - Comment --  CHL IP PHARYNGEAL PHASE 02/22/2017 Pharyngeal Phase Impaired Pharyngeal- Pudding Teaspoon -- Pharyngeal -- Pharyngeal- Pudding Cup -- Pharyngeal -- Pharyngeal- Honey Teaspoon -- Pharyngeal -- Pharyngeal- Honey Cup -- Pharyngeal -- Pharyngeal- Nectar Teaspoon Reduced tongue base retraction;Pharyngeal residue -  valleculae Pharyngeal -- Pharyngeal- Nectar Cup -- Pharyngeal -- Pharyngeal- Nectar Straw Reduced tongue base retraction;Pharyngeal residue - valleculae Pharyngeal -- Pharyngeal- Thin Teaspoon Reduced tongue base retraction;Pharyngeal residue - valleculae Pharyngeal -- Pharyngeal- Thin Cup -- Pharyngeal -- Pharyngeal- Thin Straw Reduced tongue base retraction;Pharyngeal residue - valleculae;Penetration/Aspiration before swallow Pharyngeal Material enters airway, CONTACTS cords and not ejected out Pharyngeal- Puree Reduced tongue base retraction;Pharyngeal residue - valleculae Pharyngeal -- Pharyngeal- Mechanical Soft -- Pharyngeal -- Pharyngeal- Regular -- Pharyngeal -- Pharyngeal- Multi-consistency -- Pharyngeal -- Pharyngeal- Pill -- Pharyngeal -- Pharyngeal Comment --  CHL IP CERVICAL ESOPHAGEAL PHASE 02/22/2017 Cervical Esophageal Phase WFL Pudding Teaspoon -- Pudding Cup -- Honey Teaspoon -- Honey Cup -- Nectar Teaspoon -- Nectar Cup -- Nectar Straw -- Thin Teaspoon -- Thin Cup -- Thin Straw -- Puree -- Mechanical Soft -- Regular -- Multi-consistency -- Pill -- Cervical Esophageal Comment -- No flowsheet data found.  [name] 02/22/2017, 2:37 PM  [name], M.A. CCC-SLP [phone]              Assessment/Plan  Generalized weakness From physical deconditioning with acute illness. Will have patient work with PT/OT as tolerated to regain strength and restore function.  Fall precautions are in place.  Unsteady gait With left-sided weakness post stroke.Will have him work with physical therapy and occupational therapy team to help with gait training and muscle strengthening exercises.fall precautions. Skin care. Encourage to be out of bed.   Right thalamic hemorrhage Seen by neurology in the hospital. Has left-sided weakness and will need to work with therapy team. Will make neurology follow-up appointment. Avoid anticoagulation and antiplatelet medication for now. Oral hygiene to be  provided. Continue methimazole signed a day to see if this would help improve his concentration and decreased daytime somnolence.  Acute delirium With his acute illness and ongoing infection. High risk for fall. Fall precautions to be taken. Avoid sedated and hypnotic medications where possible. Start Seroquel 25 mg daily at at 4 PM to see if this will help with his symptoms of sundowning. Get psychiatry consult. Check CBC with differential and BMP. Continue methylphenidate 10 mg twice a day.  Acute cystitis Currently on cephalexin 250 mg 3 times a day. Reviewed urine culture report from the hospital showing multiple bacterial growth. Will need to recent urine sample for study. Continue cephalexin for 1 week for now. Follow-up on culture report and make antibiotic changes as needed. Hydration to be maintained. Perineal hygiene to be maintained.  Dysphagia Will need full assistance with feeding. Speech team to follow. Strict aspiration precautions to be taken.  Blood loss anemia With intraparenchymal bleed. Monitor CBC  Dementia with behavior disturbance Exacerbated by recent stroke and urine infection. Will get psychiatry evaluation. Provide supportive care for now.  Insomnia  currently on trazodone 25 mg at bedtime as needed and melatonin 3 mg daily at bedtime. Given his increased daytime sleepiness, discontinue trazodone for now and monitor  Poor oral intake Will have registered dietitian to evaluate. Continue multivitamin for now. Assistance with feeding. Monitor weekly weight. Currently on sodium chloride at 50 mL an hour for 1 L from 7 PM to 7 AM. Will check BMP on 03/22/2017 and decide on further continuation of IV fluids. Encourage oral hydration. Will need a midline placed by IV fluids.  Back pain Likely post fall from last night. No bowel or bladder incontinence has been reported. Patient unable to provide detailed history. Will get x-ray of the thoracic and lumbar spine to rule out  fracture. Continue Tylenol 650 mg every 4 hours as needed for pain and get PMR consult.  Left arm swelling Has good radial puls and good capillary refill. No signs of DVT on exam. Will get x-ray of the left arm to rule out fracture given his fall.   GERD Per daughter he has not had any heartburn complaints. Currently on Protonix 40 mg daily. Decrease this to 20 mg daily for 1 week and then discontinue. Monitor for clinical symptoms.  Hypertension Monitor blood pressure reading. Currently on amlodipine 5 mg daily and lisinopril 20 mg twice a day. per Daughter, he had a few hypotensive episodes in the hospital. Monitor blood pressure reading every shift for now and add holding paraMeters to the blood pressure medication. Check BMP.  Constipation Likely from slow transit. Continue senna for now and monitor bowel movement.  Urinary retention Required Foley catheter in the hospital. Tolerating Flomax well  at present. Has been voiding well without a Foley catheter present. Continue Flomax and monitor urine output.     Goals of care: short term rehabilitation   Labs/tests ordered: cbc, cmp 03/22/17  Family/ staff Communication: reviewed care plan with patient and nursing supervisor  I have spent greater than 50 minutes for this encounter which includes reviewing hospital records, addressing above mentioned concerns, reviewing care plan with patient's daughter, answering patient's daughter's concerns and counseling.     Blanchie Serve, MD Internal Medicine [facility] [address] [place], [place] [address] Cell Phone (Monday-Friday 8 am - 5 pm): [phone] On Call: [phone] and follow prompts after 5 pm and on weekends Office Phone: [phone] Office Fax: [phone]

## 2017-03-22 LAB — BASIC METABOLIC PANEL
BUN: 21 mg/dL (ref 4–21)
Creatinine: 0.9 mg/dL (ref 0.6–1.3)
GLUCOSE: 107 mg/dL
Potassium: 3.8 mmol/L (ref 3.4–5.3)
Sodium: 139 mmol/L (ref 137–147)

## 2017-03-22 LAB — HEPATIC FUNCTION PANEL
ALK PHOS: 45 U/L (ref 25–125)
ALT: 27 U/L (ref 10–40)
AST: 23 U/L (ref 14–40)
BILIRUBIN, TOTAL: 0.6 mg/dL

## 2017-03-22 LAB — CBC AND DIFFERENTIAL
HEMATOCRIT: 33 % — AB (ref 41–53)
Hemoglobin: 11.3 g/dL — AB (ref 13.5–17.5)
PLATELETS: 308 10*3/uL (ref 150–399)
WBC: 9 10*3/mL

## 2017-03-24 ENCOUNTER — Encounter: Payer: Self-pay | Admitting: Family

## 2017-03-24 ENCOUNTER — Non-Acute Institutional Stay (SKILLED_NURSING_FACILITY): Payer: Medicare Other | Admitting: Family

## 2017-03-24 DIAGNOSIS — I10 Essential (primary) hypertension: Secondary | ICD-10-CM | POA: Diagnosis not present

## 2017-03-24 DIAGNOSIS — R339 Retention of urine, unspecified: Secondary | ICD-10-CM | POA: Diagnosis not present

## 2017-03-24 DIAGNOSIS — I610 Nontraumatic intracerebral hemorrhage in hemisphere, subcortical: Secondary | ICD-10-CM

## 2017-03-24 DIAGNOSIS — R2681 Unsteadiness on feet: Secondary | ICD-10-CM | POA: Diagnosis not present

## 2017-03-24 DIAGNOSIS — I69391 Dysphagia following cerebral infarction: Secondary | ICD-10-CM

## 2017-03-24 NOTE — Progress Notes (Signed)
Location:  [facility] Room Number: 407 Place of Service:  SNF [phone])  Provider: Marlowe Sax FNP-C   PCP: Myriam Jacobson, MD Patient Care Team: Lorene Dy, MD as PCP - General (Internal Medicine)  Extended Emergency Contact Information Primary Emergency Contact: Esmond Camper Address: [address]          [place], [place] [address] [place] Phone: [phone] Relation: Daughter Secondary Emergency Contact: Yazzie Jr,Max  [place] Mobile Phone: [phone] Relation: Son  Code Status: Full code  Goals of care:  Advanced Directive information Advanced Directives 02/24/2017  Does Patient Have a Medical Advance Directive? No  Would patient like information on creating a medical advance directive? No - Patient declined     Allergies  Allergen Reactions  . Tape Other (See Comments)    SKIN IS THIN (MAY TEAR AND/OR BRUISE EASILY)    Chief Complaint  Patient presents with  . Discharge Note    Discharge from [facility]     HPI:  83 y.o. male seen today at [facility] for discharge home. He was here for short term rehabilitation for post hospital admission for03/11/2017-02/23/2017 with left-sided weakness post fall. CT head showed right thalamic hemorrhage with intraventricular hemorrhage and this was likely in setting of hypertensive emergency. Neurology was consulted. CT angiogram of head and neck was unremarkable. Echocardiogram showed ejection fraction 60-65%. LDL was 70. No antithrombotic or anti-platelet agent was started given his bleed. He also had acute urinary retention and required Foley catheter. He was then transferred to inpatient rehabilitation center and underwent rehabilitation from 02/23/2017-03/18/2017. In the inpatient rehabilitation center he was treated for urinary tract infection with antibiotic. He is currently on oral antibiotic for  urinary tract infection. Per daughter he was independent prior to this hospitalization with his activities of daily living besides driving.  He has medical history of HTN, CHF,dementia,Adenomatous polyp of colon, cataract among other conditions. He is seen in his room today with his daughter at bedside. He states no acute issues.During his stay here in rehab he sustained a fall episode resulting in left arm swelling. X-ray was negative for fracture.  He has worked well with PT/OT now stable for discharge home.He will be discharged home with Home health PT/OT/ST to continue with ROM, Exercise, Gait stability and muscle strengthening. He will also need a HHRN for blood pressure monitoring. He will require a Lightweight WC with Cushion, anti tippers, extended brake handles, removable elevating leg rests  to enable her to maintain current level of independence with ADL's which cannot be achieved with walker or cane.He will also need a semi [facility] bed  with  rails he suffers from CHF and has trouble breathing at night when head is elevated less than 30 degrees. Bed wedges do not provide enough elevation to resolve breathing issues. Shortness of breath cause patient to require frequent changes in body position which cannot be achieved with a normal bed.He will also require a  3-1 bedside commode to  enable to safely and independently perform toileting transfer in home due to unsteady gait.Home health services will be arranged by facility social worker prior to discharge. Prescription medication will be written x 1 month then patient to follow up with PCP in 1-2 weeks.  He denies any acute issues this visit. Facility staff report no new concerns.    Past Medical History:  Diagnosis Date  . Adenomatous polyp  of colon   . Dementia     Past Surgical History:  Procedure Laterality Date  . CATARACT EXTRACTION W/ INTRAOCULAR LENS  IMPLANT, BILATERAL        reports that he has never smoked. He has never  used smokeless tobacco. He reports that he does not drink alcohol or use drugs. Social History   Social History  . Marital status: Single    Spouse name: N/A  . Number of children: N/A  . Years of education: N/A   Occupational History  . Not on file.   Social History Main Topics  . Smoking status: Never Smoker  . Smokeless tobacco: Never Used  . Alcohol use No  . Drug use: No  . Sexual activity: Not on file   Other Topics Concern  . Not on file   Social History Narrative  . No narrative on file    Allergies  Allergen Reactions  . Tape Other (See Comments)    SKIN IS THIN (MAY TEAR AND/OR BRUISE EASILY)    Pertinent  Health Maintenance Due  Topic Date Due  . PNA vac Low Risk Adult (1 of 2 - PCV13) 12/07/2017 (Originally [birth year]/09/1992)  . INFLUENZA VACCINE  07/07/2017    Medications: Allergies as of 03/24/2017      Reactions   Tape Other (See Comments)   SKIN IS THIN (MAY TEAR AND/OR BRUISE EASILY)      Medication List       Accurate as of 03/24/17  3:22 PM. Always use your most recent med list.          acetaminophen 325 MG tablet Commonly known as:  TYLENOL Take 1-2 tablets (325-650 mg total) by mouth every 4 (four) hours as needed for mild pain.   amLODipine 5 MG tablet Commonly known as:  NORVASC Take 1 tablet (5 mg total) by mouth daily.   cephALEXin 250 MG/5ML suspension Commonly known as:  KEFLEX Take 5 mLs (250 mg total) by mouth every 8 (eight) hours.   CERTAVITE/ANTIOXIDANTS Tabs Take 1 tablet by mouth daily.   chlorhexidine 0.12 % solution Commonly known as:  PERIDEX 15 mLs by Mouth Rinse route 2 (two) times daily.   feeding supplement (PRO-STAT SUGAR FREE 64) Liqd Take 30 mLs by mouth daily. For 30 days   lisinopril 20 MG tablet Commonly known as:  PRINIVIL,ZESTRIL Take 1 tablet (20 mg total) by mouth 2 (two) times daily.   Melatonin 3 MG Tabs Take 1 tablet (3 mg total) by mouth at bedtime.   methylphenidate 10 MG tablet Commonly  known as:  RITALIN Take 10 mg by mouth 2 (two) times daily. With breakfast and lunch   nitroGLYCERIN 2 % ointment Commonly known as:  NITROGLYN Apply 0.5 inches topically every 6 (six) hours as needed (SBP >180 or DBP> 100).   pantoprazole sodium 40 mg/20 mL Pack Commonly known as:  PROTONIX Place 10 mg into feeding tube daily. X 1 week ending on 03/26/17   potassium chloride 20 MEQ/15ML (10%) Soln Take 7.5 mLs (10 mEq total) by mouth 2 (two) times daily.   QUEtiapine 25 MG tablet Commonly known as:  SEROQUEL Take 25 mg by mouth at bedtime. For delirium   RESOURCE THICKENUP PO Take 1 g by mouth as needed.   sennosides 8.8 MG/5ML syrup Commonly known as:  SENOKOT Take 10 mLs by mouth at bedtime.   sodium phosphate 7-19 GM/118ML Enem Place 133 mLs (1 enema total) rectally once as needed for severe constipation.  tamsulosin 0.4 MG Caps capsule Commonly known as:  FLOMAX Take 1 capsule (0.4 mg total) by mouth daily after supper.       Review of Systems  Unable to perform ROS: Dementia    Vitals:   03/24/17 0916  BP: (!) 142/78  Pulse: 87  Resp: 18  Temp: 98.7 F (37.1 C)  TempSrc: Oral  SpO2: 95%  Weight: 174 lb (78.9 kg)  Height: 5\' 9"  (1.753 m)   Body mass index is 25.7 kg/m. Physical Exam  Constitutional: He appears well-developed and well-nourished. No distress.  HENT:  Head: Normocephalic.  Mouth/Throat: Oropharynx is clear and moist. No oropharyngeal exudate.  Eyes: Conjunctivae and EOM are normal. Pupils are equal, round, and reactive to light. Right eye exhibits no discharge. Left eye exhibits no discharge. No scleral icterus.  Neck: Normal range of motion. No JVD present. No thyromegaly present.  Cardiovascular: Normal rate, regular rhythm, normal heart sounds and intact distal pulses.  Exam reveals no gallop and no friction rub.   No murmur heard. Pulmonary/Chest: Effort normal and breath sounds normal. No respiratory distress. He has no wheezes. He  has no rales. He exhibits no tenderness.  Abdominal: Soft. Bowel sounds are normal. He exhibits no distension. There is no tenderness. There is no rebound and no guarding.  Musculoskeletal: He exhibits no edema, tenderness or deformity.  Left side weakness   Lymphadenopathy:    He has no cervical adenopathy.  Neurological: He is alert.  Skin: Skin is warm and dry. No rash noted. No erythema. No pallor.  Psychiatric: He has a normal mood and affect.    Labs reviewed: Basic Metabolic Panel:  Recent Labs  02/17/17 1710 02/18/17 0446 02/18/17 1822  03/12/17 0648 03/15/17 0627 03/17/17 0439 03/22/17 1447  NA  --  137  --   < > 136 134* 136 139  K  --  3.1*  --   < > 4.4 4.6 3.8 3.8  CL  --  106  --   < > 102 102 104  --   CO2  --  22  --   < > 23 21* 23  --   GLUCOSE  --  125*  --   < > 109* 120* 110*  --   BUN  --  13  --   < > 33* 36* 23* 21  CREATININE  --  0.75  --   < > 1.18 1.10 1.00 0.9  CALCIUM  --  8.5*  --   < > 9.7 9.1 9.2  --   MG 1.9 1.9 2.3  --   --   --   --   --   PHOS 2.3* 2.3* 1.7*  --   --   --   --   --   < > = values in this interval not displayed. Liver Function Tests:  Recent Labs  02/15/17 1924 02/21/17 1415 02/24/17 1010 03/22/17 1447  AST 18 43* 55* 23  ALT 8* 26 46 27  ALKPHOS 63 45 46 45  BILITOT 0.6 0.9 1.1  --   PROT 6.2* 6.1* 6.8  --   ALBUMIN 3.9 3.3* 3.8  --    CBC:  Recent Labs  02/15/17 1924  02/24/17 1010 02/26/17 0607 03/12/17 0648 03/22/17 1447  WBC 5.8  < > 13.7* 10.5 6.3 9.0  NEUTROABS 2.6  --  10.8*  --   --   --   HGB 12.9*  < > 12.9* 11.7* 11.9* 11.3*  HCT 37.5*  < > 38.7* 35.2* 34.8* 33*  MCV 96.2  < > 97.7 98.9 97.8  --   PLT 206  < > 288 266 248 308  < > = values in this interval not displayed.   Recent Labs  02/23/17 0759 02/23/17 1137 02/23/17 1626  GLUCAP 98 139* 104*    Procedures and Imaging Studies During Stay: Dg Chest 2 View Result Date: 02/24/2017 CLINICAL DATA:  Left-sided weakness and  slurred speech EXAM: CHEST  2 VIEW COMPARISON:  02/22/2017 FINDINGS: Feeding tube removed. Cardiomegaly. Bibasilar atelectasis versus airspace disease left greater than right is stable. Tiny pleural effusions are suspected. No pneumothorax. IMPRESSION: Stable bibasilar atelectasis versus airspace disease. Electronically Signed   By: Marybelle Killings M.D.   On: 02/24/2017 08:54   Assessment/Plan:   1. Unsteady gait  Has worked well with PT/ OT. Will discharge home PT/OT to continue with ROM, Exercise, Gait stability and muscle strengthening.He will require a Lightweight WC with Cushion, anti tippers, extended brake handles, removable elevating leg rests  to enable her to maintain current level of independence with ADL's which cannot be achieved with walker or cane.He will also need a semi [facility] bed  with  rails he suffers from CHF and has trouble breathing at night when head is elevated less than 30 degrees. Bed wedges do not provide enough elevation to resolve breathing issues. Shortness of breath cause patient to require frequent changes in body position which cannot be achieved with a normal bed.He will also require a  3-1 bedside commode to  enable to safely and independently perform toileting transfer in home due to unsteady gait. Fall and safety precautions.  2. Benign essential HTN SBP in the 140's. Continue on Lisinopril and amlodipine. Will discharge home with [facility] to monitor B/p. CBC and BMP in 1-2 weeks with PCP.   3. Dysphagia due to recent stroke Statu post thalamic hemorrhage.has worked with [facility]. Will discharge home with [facility] ST to continue to monitor. Continue on thicken liquids.   4. Urinary retention Required foley catheter. Currently on oral antibiotics for UTI will complete course prior to discharge. Continue to monitor.   5. Thalamic hemorrhage ([place]) Status post short term rehabilitation for post hospital admission for03/11/2017-02/23/2017 with left-sided weakness post fall.  CT head showed right thalamic hemorrhage with intraventricular hemorrhage and this was likely in setting of hypertensive emergency. Neurology was consulted. CT angiogram of head and neck was unremarkable. Echocardiogram showed ejection fraction 60-65%. LDL was 70. No antithrombotic or anti-platelet agent was started given his bleed.   Patient is being discharged with the following home health services:   -PT/OT for ROM, exercise, gait stability and muscle strengthening  -  HH RN for blood pressure monitoring  Patient is being discharged with the following durable medical equipment:    - A Lightweight WC with Cushion, anti tippers, extended brake handles, removable elevating leg rests  to enable her to maintain current level of independence with ADL's which cannot be achieved with walker or cane.  -  semi [facility] bed  with  rails he suffers from CHF and has trouble breathing at night when head is elevated less than 30 degrees. Bed wedges do not provide enough elevation to resolve breathing issues. Shortness of breath cause patient to require frequent changes in body position which cannot be achieved with a normal bed.  - A  3-1 bedside commode to  enable to safely and independently perform toileting transfer in home due  to unsteady gait.  Patient has been advised to f/u with their PCP in 1-2 weeks to for a transitions of care visit.Social services at their facility was responsible for arranging this appointment.  Pt was provided with adequate prescriptions of noncontrolled medications to reach the scheduled appointment.For controlled substances, a limited supply was provided as appropriate for the individual patient. If the pt normally receives these medications from a pain clinic or has a contract with another physician, these medications should be received from that clinic or physician only).    Future labs/tests needed:  CBC, BMP in 1-2 weeks PCP

## 2017-04-16 ENCOUNTER — Other Ambulatory Visit (HOSPITAL_COMMUNITY): Payer: Self-pay | Admitting: Internal Medicine

## 2017-04-16 DIAGNOSIS — R1319 Other dysphagia: Secondary | ICD-10-CM

## 2017-04-20 ENCOUNTER — Encounter (INDEPENDENT_AMBULATORY_CARE_PROVIDER_SITE_OTHER): Payer: Self-pay

## 2017-04-20 ENCOUNTER — Encounter: Payer: Self-pay | Admitting: Neurology

## 2017-04-20 ENCOUNTER — Ambulatory Visit (INDEPENDENT_AMBULATORY_CARE_PROVIDER_SITE_OTHER): Payer: Medicare Other | Admitting: Neurology

## 2017-04-20 VITALS — BP 107/55 | HR 83 | Ht 71.0 in

## 2017-04-20 DIAGNOSIS — I610 Nontraumatic intracerebral hemorrhage in hemisphere, subcortical: Secondary | ICD-10-CM | POA: Diagnosis not present

## 2017-04-20 NOTE — Patient Instructions (Signed)
Remember to drink plenty of fluid, eat healthy meals and do not skip any meals. Try to eat protein with a every meal and eat a healthy snack such as fruit or nuts in between meals. Try to keep a regular sleep-wake schedule and try to exercise daily, particularly in the form of walking, 20-30 minutes a day, if you can.   As far as your medications are concerned, I would like to suggest: No aspirin at this time, continue current medications  As far as diagnostic testing: CT head next month and repeat swallow evaluation  I would like to see you back in 6 months, sooner if we need to. Please call us with any interim questions, concerns, problems, updates or refill requests.   Our phone number is [phone]. We also have an after hours call service for urgent matters and there is a physician on-call for urgent questions. For any emergencies you know to call 911 or go to the nearest emergency room

## 2017-04-20 NOTE — Progress Notes (Signed)
[facility]    Provider:  Dr Jaynee Eagles Referring Provider: Lorene Dy, MD Primary Care Physician:  Lorene Dy, MD  CC:  Stroke follow up  HPI:  Anthony Fisher is a 83 y.o. male here as a referral from Dr. Mancel Bale for stroke follow up. Past medical history of dementia. Reviewed notes from [facility]. Patient presented on March 12 with a left-sided weakness, stumbling, family heard her fall and found him with left-sided weakness. The patient was initially seen by the stroke team it was reported that he had severe dysarthria and opened eyes on pain stimulation. CT of the head showed an intraparenchymal hematoma within the right thalamus extending into the right lateral ventricle consistent with a hypertensive hemorrhage (personally reviewed images and agree). MRI of the brain did not show amyloid angiopathy (personally reviewed images of the brain) He was lethargic and continued to have severe dysarthria, left facial droop, generalized weakness more on the left side  and did not pass a swallow. Patient had overnight delirium. 2-D echo showed a normal ejection fraction, LDL 70, hemoglobin A1c 5.5. He was on no antithrombotic prior to admission. Patient did not have a PEG prior to SNF, he is on pureed food. Blood pressure was 184/94 on arrival.  He is at home with is daughter. He only went to SNF for a week, they were not happy with the care and he is now at home with home PT, OT, and speech. Doing well, had some scrambled eggs and did well, requesting repeat speech and swallow. They have been checking his blood pressure at home. Before the stroke he was not on any anti-hypertensive medications. Daughter provides all information.   Reviewed notes, labs and imaging from outside physicians, which showed:  Personally reviewed MRI of the brain images which showed a right thalamic hemorrhage with intraventricular extension and an old lacunar infarct in the right caudate. There is  advanced atrophy more so in the mesial temporal lobes consistent with his diagnosis of dementia and white matter changes consistent with chronic small vessel ischemic disease.  Reviewed labs April 2018 BMP was unremarkable with BUN 21 and creatinine 0.9, CBC showed mild anemia hemoglobin 11.3. TSH normal, LDL 70, B12 277, hemoglobin A1c 5.5.  Review of Systems: Patient complains of symptoms per HPI as well as the following symptoms: speech difficulty, memory loss, drooling. Pertinent negatives per HPI. All others negative.   Social History   Social History  . Marital status: Single    Spouse name: N/A  . Number of children: 2  . Years of education: N/A   Occupational History  . Not on file.   Social History Main Topics  . Smoking status: Never Smoker  . Smokeless tobacco: Never Used  . Alcohol use No  . Drug use: No  . Sexual activity: Not on file   Other Topics Concern  . Not on file   Social History Narrative   Currently living w/ his daughter and family    Family History  Problem Relation Age of Onset  . Dementia Mother   . Heart attack Father     Past Medical History:  Diagnosis Date  . Adenomatous polyp of colon   . Dementia     Past Surgical History:  Procedure Laterality Date  . CATARACT EXTRACTION W/ INTRAOCULAR LENS  IMPLANT, BILATERAL      Current Outpatient Prescriptions  Medication Sig Dispense Refill  . amLODipine (NORVASC) 5 MG tablet Take 1 tablet (5 mg total) by  mouth daily.    Marland Kitchen lisinopril (PRINIVIL,ZESTRIL) 20 MG tablet Take 1 tablet (20 mg total) by mouth 2 (two) times daily. (Patient taking differently: Take 20 mg by mouth daily. )    . Starch, Thickening, (RESOURCE THICKENUP PO) Take 1 g by mouth as needed.    . tamsulosin (FLOMAX) 0.4 MG CAPS capsule Take 1 capsule (0.4 mg total) by mouth daily after supper. 30 capsule    No current facility-administered medications for this visit.     Allergies as of 04/20/2017 - Review Complete  04/20/2017  Allergen Reaction Noted  . Tape Other (See Comments) 02/15/2017    Vitals: BP (!) 107/55   Pulse 83   Ht 5\' 11"  (1.803 m)  Last Weight:  Wt Readings from Last 1 Encounters:  03/24/17 174 lb (78.9 kg)   Last Height:   Ht Readings from Last 1 Encounters:  04/20/17 5\' 11"  (1.803 m)   Physical exam: Exam: Gen: NAD                     CV: RRR, no MRG. No Carotid Bruits. No peripheral edema, warm, nontender Eyes: Conjunctivae clear without exudates or hemorrhage  Neuro: Detailed Neurologic Exam  Speech:    Speech is dysarthric; with impaired comprehension.  Cognition:    The patient is oriented to person    recent and remote memory impaired;     Not aphasic    Impaired attention, concentration,    fund of knowledge Cranial Nerves:    The pupils are equal, round, and reactive to light. Attempted funduscopic exam could not visualize due to patient noncooperation. Blinks to threat bilaterally. Impaired upgaze otherwise extraocular movements are intact. Trigeminal sensation is intact and the muscles of mastication are normal. Left facial droop with drooling. The palate elevates in the midline. Hearing impaired. Voice is hypophonic. Shoulder shrug is normal. The tongue has normal motion without fasciculations.   Coordination:    No dysmetria  Gait:    Attempted, patient cannot support his own weight without significant assistance  Motor Observation:    no involuntary movements noted. Tone:    Normal muscle tone.    Posture:    Posture is slightly stooped and wheelchair    Strength:    Difficulty with complete strength exam due to patient's cognitive deficits and dementia however all his limbs are antigravity there does appear to be left sided weakness more in the left arm than leg.     Sensation: Difficult due to dementia but appears to be intact to light touch     Reflex Exam:  DTR's:    Deep tendon reflexes in the upper and lower extremities are  symmetrical bilaterally.   Toes:    The toes are upgoing bilaterally.   Clonus:    Clonus is absent.    Assessment/Plan:  83 year old patient with a past medical history of dementia here for follow-up of right thalamic hemorrhage in March of this year in the setting of hypertensive emergency. Resultant dysarthria, left hemiparesis.   Repeat CT head and if no blood products can start ASA 81mg  a day for stroke prevention Swallow study Discussed compliance with medications and watching blood pressure daily. Ongoing aggressive stroke risk factor management Patient swallowing has improved recommend follow-up swallow study to see if we can advance his diet. Fall risk and fall precautions Continue PT/OT and speech therapy Recommend follow up to discuss dementia and treatment  Cc: Dr. Mancel Bale  Orders Placed This Encounter  Procedures  . CT HEAD WO CONTRAST  . DG Swallowing Func-Speech Pathology  . SLP modified barium swallow     Sarina Ill, MD  [facility] [address] [place], [place], [place] [address]  Phone [phone] Fax [phone]

## 2017-04-21 ENCOUNTER — Telehealth: Payer: Self-pay | Admitting: Neurology

## 2017-04-21 NOTE — Telephone Encounter (Signed)
Pt's daughtere called said letter from PCP was faxed this morning reg approval for pt to have swallow test on Friday. Please call to advise if this letter was rec'd

## 2017-04-21 NOTE — Telephone Encounter (Signed)
Returned call to pt's daughter to let her know that faxed referral was received. Form completed and will be faxed back after signed by MD. Verbalized understanding and appreciation for call. Pt is scheduled 11 am on Friday for swallowing study at [facility].

## 2017-04-22 ENCOUNTER — Ambulatory Visit (HOSPITAL_COMMUNITY): Payer: Medicare Other

## 2017-04-22 ENCOUNTER — Other Ambulatory Visit (HOSPITAL_COMMUNITY): Payer: Self-pay

## 2017-04-23 ENCOUNTER — Ambulatory Visit (HOSPITAL_COMMUNITY)
Admission: RE | Admit: 2017-04-23 | Discharge: 2017-04-23 | Disposition: A | Discharge status: Home / Self Care | Payer: Medicare Other | Source: Ambulatory Visit | Attending: Internal Medicine | Admitting: Internal Medicine

## 2017-04-23 DIAGNOSIS — R42 Dizziness and giddiness: Secondary | ICD-10-CM | POA: Diagnosis not present

## 2017-04-23 DIAGNOSIS — R1310 Dysphagia, unspecified: Secondary | ICD-10-CM | POA: Insufficient documentation

## 2017-04-23 DIAGNOSIS — R1319 Other dysphagia: Secondary | ICD-10-CM

## 2017-04-23 NOTE — Progress Notes (Signed)
Modified Barium Swallow Progress Note  Patient Details  Name: Anthony Fisher MRN: [id number] Date of Birth: 1926-07-24  Today's Date: 04/23/2017  Modified Barium Swallow completed.  Full report located under Chart Review in the Imaging Section.  Brief recommendations include the following:  Clinical Impression  Moderate oral dysphagia marked by decreased containment of saliva at baseline, decreased cohesion and lingual residue with intermittent spill to valleculae. Mild-moderate pharyngeal dysphagia from sensory source with late trigger to pyriforms. Airway was compromised marked by flash penetration during and before the swallow as well as aspiration from pyriforms spilling into airway before the swallow followed by strong reflexive cough (ineffective to clear trachea). Pt was impulsive and consumed large sips of thin. Verbal and tactile cues for smaller sips consistently prevented penetration or aspiration. Swallow initiated at vallecuale and pyriform sinuses with minimal vallecular residue. Recommend Dys 2 texture ( minced-ground texture) and thin liquids, SMALL sips, NO straws, pills whole in puree if small (crush if large). Full supervision and tactile cues for small sips.       Swallow Evaluation Recommendations       SLP Diet Recommendations: Dysphagia 2 (Fine chop) solids;Thin liquid   Liquid Administration via: Cup;No straw   Medication Administration: Whole meds with puree (or crush if large)   Supervision: Patient able to self feed;Full supervision/cueing for compensatory strategies   Compensations: Slow rate;Minimize environmental distractions;Small sips/bites                Anthony Fisher 04/23/2017,4:47 PM   Orbie Pyo Colvin Caroli.[employer] [phone]

## 2017-04-26 NOTE — Progress Notes (Signed)
SLP addendum   04/26/17 1400  SLP G-Codes **NOT FOR INPATIENT CLASS**  Functional Assessment Tool Used skilled clinical judgement  Functional Limitations Swallowing  Swallow Current Status (N1833) CJ  Swallow Goal Status (P8251) [place]  Swallow Discharge Status [phone]) CJ  Cranford Mon[employer] [phone]

## 2017-04-27 ENCOUNTER — Encounter: Payer: Self-pay | Admitting: Physical Medicine & Rehabilitation

## 2017-04-27 ENCOUNTER — Encounter: Payer: Medicare Other | Attending: Physical Medicine & Rehabilitation | Admitting: Physical Medicine & Rehabilitation

## 2017-04-27 VITALS — BP 97/54 | HR 71 | Resp 14

## 2017-04-27 DIAGNOSIS — A499 Bacterial infection, unspecified: Secondary | ICD-10-CM | POA: Insufficient documentation

## 2017-04-27 DIAGNOSIS — I619 Nontraumatic intracerebral hemorrhage, unspecified: Secondary | ICD-10-CM | POA: Diagnosis not present

## 2017-04-27 DIAGNOSIS — R7989 Other specified abnormal findings of blood chemistry: Secondary | ICD-10-CM

## 2017-04-27 DIAGNOSIS — I610 Nontraumatic intracerebral hemorrhage in hemisphere, subcortical: Secondary | ICD-10-CM

## 2017-04-27 DIAGNOSIS — R739 Hyperglycemia, unspecified: Secondary | ICD-10-CM | POA: Diagnosis not present

## 2017-04-27 DIAGNOSIS — G8192 Hemiplegia, unspecified affecting left dominant side: Secondary | ICD-10-CM | POA: Diagnosis not present

## 2017-04-27 DIAGNOSIS — Z9889 Other specified postprocedural states: Secondary | ICD-10-CM | POA: Diagnosis not present

## 2017-04-27 DIAGNOSIS — N390 Urinary tract infection, site not specified: Secondary | ICD-10-CM

## 2017-04-27 DIAGNOSIS — I509 Heart failure, unspecified: Secondary | ICD-10-CM | POA: Insufficient documentation

## 2017-04-27 DIAGNOSIS — I69391 Dysphagia following cerebral infarction: Secondary | ICD-10-CM

## 2017-04-27 NOTE — Addendum Note (Signed)
Addended by: Elinor Parkinson I on: 04/27/2017 11:39 AM   Modules accepted: Orders

## 2017-04-27 NOTE — Progress Notes (Signed)
Subjective:    Patient ID: Anthony Fisher, male    DOB: 1926/11/09, 83 y.o.   MRN: [id number]  HPI   Anthony Fisher is here in follow up of his right thalamic hemorrhage. He has been home for about a month. He only was at the SNF for one week. The family is working together on his care. He is receiving HH therapies who is working with him on ambulation with walker, increased engagement of his left side. He was advanced to thin lquids and D3. Family has noticed his urine is dark colored and foul smelling. He has been hallucinating as well primarily at night.   Grandaughter has noticed that he will have apneic episodes when he sleeps as well. Intake is fair. He still needs encouragement to take PO.   His bowels and bladder have been emptying.      Pain Inventory Average Pain 0 Pain Right Now 0 My pain is no pain  In the last 24 hours, has pain interfered with the following? General activity 0 Relation with others 0 Enjoyment of life 0 What TIME of day is your pain at its worst? no pain Sleep (in general) Good  Pain is worse with: no pain Pain improves with: no pain Relief from Meds: no pain  Mobility walk with assistance use a walker how many minutes can you walk? 20 ability to climb steps?  no do you drive?  no use a wheelchair needs help with transfers Do you have any goals in this area?  yes  Function retired I need assistance with the following:  feeding, dressing, bathing, toileting, meal prep and household duties  Neuro/Psych bladder control problems trouble walking spasms confusion  Prior Studies hospital f/u  Physicians involved in your care hospital f/u   Family History  Problem Relation Age of Onset  . Dementia Mother   . Heart attack Father    Social History   Social History  . Marital status: Single    Spouse name: N/A  . Number of children: 2  . Years of education: N/A   Social History Main Topics  . Smoking status: Never Smoker  .  Smokeless tobacco: Never Used  . Alcohol use No  . Drug use: No  . Sexual activity: Not Asked   Other Topics Concern  . None   Social History Narrative   Currently living w/ his daughter and family   Past Surgical History:  Procedure Laterality Date  . CATARACT EXTRACTION W/ INTRAOCULAR LENS  IMPLANT, BILATERAL     Past Medical History:  Diagnosis Date  . Adenomatous polyp of colon   . Dementia    BP (!) 97/54 (BP Location: Right Arm, Patient Position: Sitting, Cuff Size: Normal)   Pulse 71   Resp 14   SpO2 98%   Opioid Risk Score:   Fall Risk Score:  `1  Depression screen PHQ 2/9  No flowsheet data found.  Review of Systems  Constitutional: Positive for unexpected weight change.  HENT: Negative.   Eyes: Negative.   Respiratory: Negative.   Cardiovascular: Negative.   Gastrointestinal: Negative.   Endocrine: Negative.   Genitourinary: Positive for dysuria.  Musculoskeletal: Positive for gait problem.       Spasms   Skin: Negative.   Allergic/Immunologic: Negative.   Hematological: Negative.   Psychiatric/Behavioral: Positive for confusion and decreased concentration.  All other systems reviewed and are negative.      Objective:   Physical Exam  Constitutional:  . HENT:  Normocephalicand atraumatic. Dentition poor Eyes: EOMI. No discharge.  Cardiovascular: RRR Respiratory: CTA B.  GI: Soft. Bowel sounds are normal.  Musculoskeletal: He exhibits no edemaor tenderness.  Neurological:  Fairly alert. Oriented to person only. Speech garbled. Knew he lived in [place]. Recognized family. Delayed processing.  Moving left side spontaneously.    LUE 3/5 to 3+ with no tone LLE 3+ to 4/5 Skin: intact Psychiatric:  Remains flat       Assessment & Plan:  1. Functional deficits secondary to right thalamic hemorrhage  -persistent left hemiparesis and cognitive deficits - continue HH PT, OT, SLP 2. Pain Management: Tylenol prn.  3. Increased  lethargy/confusion:  -check ua and ucx  -check bmet  -normalize sleep as possible  -consider stimulant--strattera as well  4. Dysphagia: On dysphagia 3, thin liquids with aspiration precautions.   -keep HOB elevated for sleet 5. Hyperglycemia: Hgb A1C- 5.5. Likely due to thickened juices for hydration.  6. Mild CHF due to Fluid overload: Monitor weights daily especially with IVF- no SOB             - Low salt diet.              -back down to lisinopril 10mg  daily

## 2017-04-27 NOTE — Patient Instructions (Addendum)
PLEASE FEEL FREE TO CALL OUR OFFICE WITH ANY PROBLEMS OR QUESTIONS ([phone])    WE CAN CONSIDER ANOTHER STIMULANT.  PUSH FLUIDS FOR NOW DECREASE LISINOPRIL TO 10MG  DAILY

## 2017-04-28 ENCOUNTER — Telehealth: Payer: Self-pay | Admitting: Physical Medicine & Rehabilitation

## 2017-04-28 LAB — BASIC METABOLIC PANEL
BUN/Creatinine Ratio: 20 (ref 10–24)
BUN: 17 mg/dL (ref 8–27)
CO2: 23 mmol/L (ref 18–29)
Calcium: 9.3 mg/dL (ref 8.6–10.2)
Chloride: 105 mmol/L (ref 96–106)
Creatinine, Ser: 0.84 mg/dL (ref 0.76–1.27)
GFR calc Af Amer: 90 mL/min/{1.73_m2} (ref >59)
GFR calc non Af Amer: 78 mL/min/{1.73_m2} (ref >59)
Glucose: 133 mg/dL — ABNORMAL HIGH (ref 65–99)
Potassium: 4.2 mmol/L (ref 3.5–5.2)
Sodium: 142 mmol/L (ref 134–144)

## 2017-04-28 LAB — URINALYSIS, ROUTINE W REFLEX MICROSCOPIC

## 2017-04-28 MED ORDER — CEPHALEXIN 250 MG PO CAPS: 250.0000 mg | ORAL_CAPSULE | Freq: Three times a day (TID) | ORAL | 0 refills | Status: DC

## 2017-04-28 NOTE — Telephone Encounter (Signed)
Let daughter know that BMET looks ok. Just waiting on urine now.  thanks

## 2017-04-28 NOTE — Telephone Encounter (Signed)
He may be a little "down" volume-wise but his labs DO NOT reflect substantial (if any) dehydration. Recommend trying to push fluids as much as possible (whatever he wants and likes).   I have gone ahead and sent [facility]---I am more concerned about that at this point

## 2017-04-28 NOTE — Telephone Encounter (Signed)
Daughter appreciated call back.  I explained that BMET results were okay and numbers were in accepted ranges. She added that they have been unable to collect urine with home collection.  Daughter is very concerned about dehydration and would like your input on the matter....;please advise

## 2017-04-29 NOTE — Telephone Encounter (Signed)
Relayed message to [facility], she understands and will take care of him and his prescriptions

## 2017-05-05 ENCOUNTER — Telehealth: Payer: Self-pay

## 2017-05-05 NOTE — Telephone Encounter (Signed)
Mrs king called today wishing for more understanding on why this patient was prescribed keflex antibiotics. Called back but no answer, left voicemail to return call

## 2017-05-06 LAB — URINE CULTURE: Organism ID, Bacteria: NO GROWTH

## 2017-05-07 NOTE — Telephone Encounter (Signed)
Called patient back, explained that the keflex was to take care of 2 issues, one being a possible active UTI that was ongoing, and the second reason as a preventive to help against any UTI at the same time, she understood the reasons.

## 2017-05-31 ENCOUNTER — Ambulatory Visit
Admission: RE | Admit: 2017-05-31 | Discharge: 2017-05-31 | Disposition: A | Discharge status: Home / Self Care | Payer: Medicare Other | Source: Ambulatory Visit | Attending: Neurology | Admitting: Neurology

## 2017-05-31 DIAGNOSIS — I610 Nontraumatic intracerebral hemorrhage in hemisphere, subcortical: Secondary | ICD-10-CM

## 2017-06-15 ENCOUNTER — Encounter: Payer: Medicare Other | Attending: Physical Medicine & Rehabilitation | Admitting: Physical Medicine & Rehabilitation

## 2017-06-15 ENCOUNTER — Encounter: Payer: Self-pay | Admitting: Physical Medicine & Rehabilitation

## 2017-06-15 VITALS — BP 101/63 | HR 70

## 2017-06-15 DIAGNOSIS — Z9889 Other specified postprocedural states: Secondary | ICD-10-CM | POA: Diagnosis not present

## 2017-06-15 DIAGNOSIS — I509 Heart failure, unspecified: Secondary | ICD-10-CM | POA: Insufficient documentation

## 2017-06-15 DIAGNOSIS — I69391 Dysphagia following cerebral infarction: Secondary | ICD-10-CM | POA: Diagnosis not present

## 2017-06-15 DIAGNOSIS — I610 Nontraumatic intracerebral hemorrhage in hemisphere, subcortical: Secondary | ICD-10-CM

## 2017-06-15 DIAGNOSIS — G8192 Hemiplegia, unspecified affecting left dominant side: Secondary | ICD-10-CM | POA: Diagnosis not present

## 2017-06-15 DIAGNOSIS — F0150 Vascular dementia without behavioral disturbance: Secondary | ICD-10-CM

## 2017-06-15 DIAGNOSIS — I619 Nontraumatic intracerebral hemorrhage, unspecified: Secondary | ICD-10-CM | POA: Insufficient documentation

## 2017-06-15 DIAGNOSIS — R739 Hyperglycemia, unspecified: Secondary | ICD-10-CM | POA: Diagnosis not present

## 2017-06-15 NOTE — Progress Notes (Signed)
Subjective:    Patient ID: Anthony Fisher, male    DOB: 1926/12/04, 83 y.o.   MRN: [id number]  HPI   Mr. Bogusz is here in follow up of his right thalamic hemorrhage. He completed HH therapies 2 weeks ago. Family is working with him on gait using gait belt. They are also working on his left hand and with his dexterity. He needs a lot of assistance still with self-care. He is continent of bowel and bladder. He is drooling but on a regular diet. Appetite improving  He does report being tired a lot but reports that he's sleeping well. He does have a "spurt" of restless each day around 3-4pm per therapy.    Pain Inventory Average Pain 0 Pain Right Now 0 My pain is na  In the last 24 hours, has pain interfered with the following? General activity 2 Relation with others 2 Enjoyment of life 2 What TIME of day is your pain at its worst? na Sleep (in general) Fair  Pain is worse with: na Pain improves with: na Relief from Meds: na  Mobility walk with assistance ability to climb steps?  no do you drive?  no  Function retired I need assistance with the following:  dressing, bathing, toileting, meal prep, household duties and shopping  Neuro/Psych No problems in this area  Prior Studies Any changes since last visit?  no  Physicians involved in your care Any changes since last visit?  no   Family History  Problem Relation Age of Onset  . Dementia Mother   . Heart attack Father    Social History   Social History  . Marital status: Single    Spouse name: N/A  . Number of children: 2  . Years of education: N/A   Social History Main Topics  . Smoking status: Never Smoker  . Smokeless tobacco: Never Used  . Alcohol use No  . Drug use: No  . Sexual activity: Not Asked   Other Topics Concern  . None   Social History Narrative   Currently living w/ his daughter and family   Past Surgical History:  Procedure Laterality Date  . CATARACT EXTRACTION W/ INTRAOCULAR  LENS  IMPLANT, BILATERAL     Past Medical History:  Diagnosis Date  . Adenomatous polyp of colon   . Dementia    BP 101/63   Pulse 70   SpO2 98%   Opioid Risk Score:   Fall Risk Score:  `1  Depression screen PHQ 2/9  No flowsheet data found.   Review of Systems  HENT: Negative.   Eyes: Negative.   Respiratory: Negative.   Cardiovascular: Negative.   Gastrointestinal: Negative.   Endocrine: Negative.   Genitourinary: Negative.   Musculoskeletal: Negative.   Skin: Negative.   Allergic/Immunologic: Negative.   Neurological: Positive for weakness.  Hematological: Negative.   Psychiatric/Behavioral: Negative.   All other systems reviewed and are negative.      Objective:   Physical Exam  Constitutional:  . HENT: Normocephalicand atraumatic. Dentition poor. No drooling Eyes: EOMI. No discharge.  Cardiovascular: RRR without murmur. No JVD  Respiratory: CTA Bilaterally without wheezes or rales. Normal effort  GI: Soft. Bowel sounds are normal.  Musculoskeletal: He exhibits no edemaor tenderness.  Neurological: oriented to perseon and place. Speech dysarthric. Left facial weakness.   initiates more today LUE 3+ to 4/5 with no tone LLE 3+ to 4/5--slower at ankle Delayed processing.  Skin: intact Psychiatric: Remains flat  Assessment & Plan:  1. Functional deficits secondary to right thalamic hemorrhage             -improving left sided motor skills. Improved cognition  -?post-lunchtime nap to help at end of day when he fatigues - advance to outpt PT, OT, SLP at neuro-rehab to progress gait, speech, functional use of left arm 2. Pain Management: Tylenol prn.  3. Drooling: local mgt. SLP 4. Dysphagia: regular diet with thins             -keep HOB elevated during eating, slow down 5. Hyperglycemia: Hgb A1C- 5.5. Likely due to thickened juices for hydration.  6. Mild CHF due to Fluid overload: lungs clear.  - Low salt diet.    -off lisinopril currently. bp controlled.    Fifteen minutes of face to face patient care time were spent during this visit. All questions were encouraged and answered.  Folow up in 2 months.

## 2017-06-15 NOTE — Patient Instructions (Signed)
PLEASE FEEL FREE TO CALL OUR OFFICE WITH ANY PROBLEMS OR QUESTIONS ([phone])      

## 2017-06-24 ENCOUNTER — Ambulatory Visit: Payer: Medicare Other | Admitting: Occupational Therapy

## 2017-06-24 ENCOUNTER — Ambulatory Visit: Payer: Self-pay | Admitting: Occupational Therapy

## 2017-06-24 ENCOUNTER — Ambulatory Visit: Payer: Self-pay

## 2017-07-02 ENCOUNTER — Ambulatory Visit: Payer: Medicare Other

## 2017-07-02 ENCOUNTER — Encounter: Payer: Self-pay | Admitting: Physical Therapy

## 2017-07-02 ENCOUNTER — Ambulatory Visit: Payer: Medicare Other | Attending: Physical Medicine & Rehabilitation | Admitting: Occupational Therapy

## 2017-07-02 ENCOUNTER — Ambulatory Visit: Payer: Medicare Other | Admitting: Physical Therapy

## 2017-07-02 DIAGNOSIS — R1312 Dysphagia, oropharyngeal phase: Secondary | ICD-10-CM | POA: Insufficient documentation

## 2017-07-02 DIAGNOSIS — R471 Dysarthria and anarthria: Secondary | ICD-10-CM | POA: Insufficient documentation

## 2017-07-02 DIAGNOSIS — I619 Nontraumatic intracerebral hemorrhage, unspecified: Principal | ICD-10-CM

## 2017-07-02 DIAGNOSIS — R41841 Cognitive communication deficit: Secondary | ICD-10-CM

## 2017-07-02 DIAGNOSIS — R482 Apraxia: Secondary | ICD-10-CM | POA: Diagnosis present

## 2017-07-02 DIAGNOSIS — R41842 Visuospatial deficit: Secondary | ICD-10-CM | POA: Insufficient documentation

## 2017-07-02 DIAGNOSIS — G8192 Hemiplegia, unspecified affecting left dominant side: Secondary | ICD-10-CM

## 2017-07-02 DIAGNOSIS — R2681 Unsteadiness on feet: Secondary | ICD-10-CM | POA: Insufficient documentation

## 2017-07-02 DIAGNOSIS — R414 Neurologic neglect syndrome: Secondary | ICD-10-CM | POA: Insufficient documentation

## 2017-07-02 DIAGNOSIS — I69354 Hemiplegia and hemiparesis following cerebral infarction affecting left non-dominant side: Secondary | ICD-10-CM | POA: Diagnosis present

## 2017-07-02 DIAGNOSIS — I69118 Other symptoms and signs involving cognitive functions following nontraumatic intracerebral hemorrhage: Secondary | ICD-10-CM | POA: Diagnosis present

## 2017-07-02 NOTE — Therapy (Signed)
[facility] [address] [place], [place], [address] Phone: [phone]   Fax:  [phone]  Physical Therapy Evaluation  Patient Details  Name: Anthony Fisher MRN: [id number] Date of Birth: 16-Feb-1926 Referring Provider: Alger Simons, MD  Encounter Date: 07/02/2017      PT End of Session - 07/02/17 2040    Visit Number 1   Number of Visits 17   Date for PT Re-Evaluation 08/31/17   Authorization Type [employer] MCR    PT Start Time 1406   PT Stop Time 1446   PT Time Calculation (min) 40 min   Equipment Utilized During Treatment Gait belt   Activity Tolerance Patient tolerated treatment well  awake, alert      Past Medical History:  Diagnosis Date  . Adenomatous polyp of colon   . Dementia     Past Surgical History:  Procedure Laterality Date  . CATARACT EXTRACTION W/ INTRAOCULAR LENS  IMPLANT, BILATERAL      There were no vitals filed for this visit.       Subjective Assessment - 07/02/17 1412    Subjective Daughter reports he has gotten weaker since HHPT stopped ~4 weeks ago. HHPT was 2-3 times/week. HH happened while aid was with him (daughter at work) and she states there was no HEP explained to them prior to d/c. [place] did do education on how to safely walk with pt. Daughter reports he does not use wheelchair in the house and always has assistance when up walking.    Patient is accompained by: Family member  daughter, Anthony Fisher   Patient Stated Goals Daughter's goal is for him stronger; be able to walk farther and with less assist.    Currently in Pain? No/denies            [facility] PT Assessment - 07/02/17 1459      Assessment   Medical Diagnosis Rt thalamic hemorrhage   Referring Provider Alger Simons, MD   Onset Date/Surgical Date 02/15/17   Hand Dominance Right   Prior Therapy CIR, SNF, HH     Precautions   Precautions Fall   Precaution Comments On thin liquids     [place] residence   Living Arrangements Children   Available Help at Discharge Family;Personal care attendant;Available 24 hours/day     Prior Function   Level of Independence Independent  prior to hemorrhage   Vocation Retired   Teaching laboratory technician   Overall Cognitive Status Difficult to assess   Difficult to assess due to Impaired communication  h/o dementia; garbled low-volume speech   Attention Sustained   Sustained Attention Impaired   Sustained Attention Impairment --  easily distracted (internally also)   Awareness Impaired   Awareness Impairment Intellectual impairment  unable to state his deficits from the hemorrhage     Observation/Other Assessments   Observations slouched in w/c, hands in his lap, fidgeting his feet on w/c foot pedals.    Focus on Therapeutic Outcomes (FOTO)  FS 12 (risk adjusted to 37)   Stroke Impact Scale  Mobility 11.1%     Observation/Other Assessments-Edema    Edema --  none observed in LE's     Sensation   Light Touch Impaired by gross assessment   Proprioception Impaired by gross assessment   Additional Comments attempted numerous approaches to assess sensation and pt cognitively could not comprehend and provide input/answers; functionally he moves like his  sensation is impaired     Coordination   Gross Motor Movements are Fluid and Coordinated No   Fine Motor Movements are Fluid and Coordinated No   Coordination and Movement Description difficulty placing feet on the foot pedals (ataxic LLE more than RLE), unable to tap toes on the left for RAM assessment(has ROM and strength, but could not coordinate motion     Posture/Postural Control   Posture/Postural Control Postural limitations   Postural Limitations Rounded Shoulders;Forward head;Increased thoracic kyphosis;Decreased lumbar lordosis;Posterior pelvic tilt     ROM / Strength   AROM / PROM / Strength AROM;PROM;Strength     AROM    Overall AROM  Within functional limits for tasks performed   Overall AROM Comments hips, knees, ankles     PROM   Overall PROM  Within functional limits for tasks performed   Overall PROM Comments bil ankle DF WNL     Strength   Overall Strength Deficits   Overall Strength Comments RLE: knee extension 4/5, flexion 3+/5; LLE: knee extension 3+/5, flexion difficulty coordinating movement/?understanding      Flexibility   Soft Tissue Assessment /Muscle Length --  Hamstrings tight with unable to fully extend knees when seat     Transfers   Transfers Stand Pivot Transfers;Squat Pivot Transfers   Stand Pivot Transfers 4: Min assist;3: Mod assist   Stand Pivot Transfer Details (indicate cue type and reason) to his right min assist; to his left he resisted turning left eventhough he could identify that the w/c was on his left, he continued to side step to rt away from the w/c    Squat Pivot Transfers 4: Min assist;With upper extremity assistance   Squat Pivot Transfer Details (indicate cue type and reason) to his rt with near arm reaching across to armrest on w/c     Ambulation/Gait   Ambulation/Gait Yes   Ambulation/Gait Assistance 4: Min assist;3: Mod assist   Ambulation/Gait Assistance Details pt at times walking on his toes/forefoot; significant imbalance with need for mod assist to recover   Ambulation Distance (Feet) 12 Feet  x2   Assistive device 1 person hand held assist   Gait Pattern Step-to pattern;Step-through pattern;Decreased step length - right;Decreased step length - left;Decreased dorsiflexion - left;Right steppage;Left steppage;Left flexed knee in stance;Ataxic;Narrow base of support   Ambulation Surface Level            Objective measurements completed on examination: See above findings.                  PT Education - 07/02/17 2035    Education provided Yes   Education Details PT POC   Person(s) Educated Patient;Child(ren)   Methods Explanation    Comprehension Verbalized understanding          PT Short Term Goals - 07/02/17 2102      PT SHORT TERM GOAL #1   Title Patient with assist of family or care attendant will be independent with HEP for strengthening, coordination, and balance.     Time 4   Period Weeks   Status New   Target Date 08/01/17     PT SHORT TERM GOAL #2   Title Patient will transfer to both his left and his right with min guard assist.   Time 4   Period Weeks   Status New   Target Date 08/01/17     PT SHORT TERM GOAL #3   Title Patient will ambulate with least restrictive assistive device x 120 ft  with up to min assist.    Time 4   Period Weeks   Status New   Target Date 08/01/17     PT SHORT TERM GOAL #4   Title Improve 5 times sit to stand by 3 seconds to demonstrate incr LE strength and balance.    Time 4   Period Weeks   Status New   Target Date 08/01/17           PT Long Term Goals - 07/02/17 2110      PT LONG TERM GOAL #1   Title Patient/family/care attendant will be independent with the updated HEP.    Time 8   Period Weeks   Status New   Target Date 08/31/17     PT LONG TERM GOAL #2   Title Patient will transfer to both his left and his right with no physical assist, however supervision for vc's and for safety.   Time 8   Period Weeks   Target Date 08/31/17     PT LONG TERM GOAL #3   Title Patient will ambulate 126ft with LRAD and min-guard assist.    Time 8   Period Weeks   Status New   Target Date 08/31/17     PT LONG TERM GOAL #4   Title Patient will improve his 5 times sit to stand by 3 seconds compared to his time at STG check.    Time 8   Period Weeks   Status New   Target Date 08/31/17                Plan - 07/02/17 2042    Clinical Impression Statement Pt is a 83 y.o. male who presents to outpatient rehab s/p Rt thalamic hemorrhage on 02/15/17 with multiple deficits including: Lt sided weakness, Lt inattention, decreased balance, requires 1  person assist for all mobility, and decreased coordination with bil LEs. Patient should benefit from cotinued PT to decrease his fall risk.    History and Personal Factors relevant to plan of care: PMHx-dementia, HTN, CHF;  Personal factors-age, cognitive status,    Clinical Presentation Stable   Clinical Presentation due to: Patient now 4 months post-acute event; living at home with family and aid support   Clinical Decision Making Low   Rehab Potential Fair   Clinical Impairments Affecting Rehab Potential cognition, decreased sensation   PT Frequency 2x / week   PT Duration 8 weeks   PT Treatment/Interventions ADLs/Self Care Home Management;Functional mobility training;Gait training;DME Instruction;Therapeutic activities;Therapeutic exercise;Balance training;Neuromuscular re-education;Cognitive remediation;Patient/family education;Orthotic Fit/Training;Wheelchair mobility training;Passive range of motion   PT Next Visit Plan try 5x sit to stand; try walking with RW with chair follow or to chair set-up ~20 ft away; initiate HEP for LE strength and balance; if aide brings him, get an idea of their needs/concerns   Consulted and Agree with Plan of Care Patient;Family member/caregiver   Family Member Consulted daughter, Anthony [place]      Patient will benefit from skilled therapeutic intervention in order to improve the following deficits and impairments:  Abnormal gait, Cardiopulmonary status limiting activity, Decreased balance, Decreased cognition, Decreased coordination, Decreased knowledge of precautions, Decreased knowledge of use of DME, Decreased mobility, Decreased strength, Difficulty walking, Impaired sensation  Visit Diagnosis: Hemiparesis of left dominant side due to nontraumatic intracerebral hemorrhage ([place]) - Plan: PT plan of care cert/re-cert  Unsteadiness on feet - Plan: PT plan of care cert/re-cert  Neurological neglect syndrome - Plan: PT plan of care cert/re-cert  G-Codes -  07/02/17 2120    Functional Assessment Tool Used (Outpatient Only) FIM level of assistance: moderate assistance with transfers and gait   Functional Limitation Mobility: Walking and moving around   Mobility: Walking and Moving Around Current Status [phone]) At least 60 percent but less than 80 percent impaired, limited or restricted   Mobility: Walking and Moving Around Goal Status [phone]) At least 20 percent but less than 40 percent impaired, limited or restricted       Problem List Patient Active Problem List   Diagnosis Date Noted  . Bacterial UTI 04/27/2017  . CHF (congestive heart failure) ([place])   . Fall   . Fever   . Benign essential HTN   . Labile blood pressure   . Prerenal azotemia   . Hemiparesis of left dominant side due to nontraumatic intracerebral hemorrhage ([place]) 02/24/2017  . IVH (intraventricular hemorrhage) ([place]) 02/23/2017  . Hypertensive emergency 02/23/2017  . Dysphagia due to recent stroke 02/23/2017  . Urinary retention 02/23/2017  . Dementia 02/23/2017  . Congestive heart failure ([place]) 02/23/2017  . Hypokalemia 02/23/2017  . Thalamic hemorrhage ([place]) 02/23/2017  . ICH (intracerebral hemorrhage) ([place]) - R thalamic hemorrhage 02/16/2017    Rexanne Mano, PT 07/02/2017, 9:30 PM  [facility] [address] [place], [place], [address] Phone: [phone]   Fax:  [phone]  Name: SERGEY ISHLER MRN: [id number] Date of Birth: March 26, 1926

## 2017-07-02 NOTE — Therapy (Signed)
[facility] [address] [place], [place], [address] Phone: [phone]   Fax:  [phone]  Speech Language Pathology Evaluation  Patient Details  Name: Anthony Fisher MRN: [id number] Date of Birth: 13-Sep-1926 Referring Provider: Alger Simons, MD  Encounter Date: 07/02/2017      End of Session - 07/02/17 1641    Visit Number 1   Number of Visits 17  possible 9 visits;depending on progress   Date for SLP Re-Evaluation 09/10/17   SLP Start Time 9242   SLP Stop Time  6834   SLP Time Calculation (min) 43 min   Activity Tolerance Patient limited by lethargy;Patient limited by fatigue      Past Medical History:  Diagnosis Date  . Adenomatous polyp of colon   . Dementia     Past Surgical History:  Procedure Laterality Date  . CATARACT EXTRACTION W/ INTRAOCULAR LENS  IMPLANT, BILATERAL      There were no vitals filed for this visit.      Subjective Assessment - 07/02/17 1445    Subjective Pt largely unintelligible due to soft voice.    Patient is accompained by: [name], daughter   Currently in Pain? No/denies            SLP Evaluation [facility] - 07/02/17 1445      SLP Visit Information   SLP Received On 07/02/17   Referring Provider Alger Simons, MD   Onset Date March 2018   Medical Diagnosis Thalamic hemmorhage     General Information   HPI Pt with thalamic CVA in March 2018 with dysphagia and cognitive deficits documented. Dysphagia had a cognitive component to it, reassessment with modified (MBSS) in May recommended chopped food (Dys 2) diet and thin liquids. No overt s/s aspiration PNA today nor any reported. Pt with decr'd saliva management.      Prior Functional Status   Cognitive/Linguistic Baseline Baseline deficits   Baseline deficit details memory - baseline with some demential premorbidly   Type of Home House  pt answered mobile home but lives in house    Lives With Son  H[place] Oris. (son) and [place]    Available Support Family   Vocation Retired     Associate Professor   Overall Cognitive Status Impaired/Different from baseline   Area of Impairment Orientation   Orientation Level Place  Wrong answer to yes/no ("yes" to "a bank?)"   Attention Focused;Sustained   Focused Attention Impaired   Focused Attention Impairment Verbal basic   Sustained Attention Impaired   Sustained Attention Impairment Verbal basic  letter ID 7/12&false+x3;sustained attn approx 30 seconds   Memory Impaired   Memory Impairment Storage deficit;Decreased short term memory;Decreased recall of new information   Decreased Short Term Memory Functional basic;Verbal basic   Awareness Impaired   Awareness Impairment Intellectual impairment   Behaviors --  very flat affect     Auditory Comprehension   Overall Auditory Comprehension Appears within functional limits for tasks assessed     Verbal Expression   Overall Verbal Expression Appears within functional limits for tasks assessed     Oral Motor/Sensory Function   Overall Oral Motor/Sensory Function Impaired   Labial ROM Reduced right;Reduced left   Labial Strength Reduced   Labial Coordination Reduced   Lingual ROM Reduced left;Reduced right   Lingual Symmetry Abnormal symmetry left   Lingual Strength Reduced   Lingual Coordination Reduced   Facial ROM Within Functional Limits   Overall Oral Motor/Sensory Function Assessment  a little difficult to complete secondary to decr'd attention and intermittent somnolence.     Motor Speech   Overall Motor Speech Impaired   Articulation Impaired   Level of Impairment Word   Intelligibility Intelligibility reduced   Word 75-100% accurate  approx 90% decr'd with fatigue   Phrase 75-100% accurate  80-90% depending on fatigue/attn   Sentence 75-100% accurate   Conversation --  80% - lower with fatigue   Interfering Components --  decr'd cognition   Effective Techniques Increased vocal  intensity      04/23/2017  Modified Barium Swallow Clinical Impression Moderate oral dysphagia marked by decreased containment of saliva at baseline, decreased cohesion and lingual residue with intermittent spill to valleculae. Mild-moderate pharyngeal dysphagia from sensory source with late trigger to pyriforms. Airway was compromised marked by flash penetration during and before the swallow as well as aspiration from pyriforms spilling into airway before the swallow followed by strong reflexive cough (ineffective to clear trachea). Pt was impulsive and consumed large sips of thin. Verbal and tactile cues for smaller sips consistently prevented penetration or aspiration. Swallow initiated at vallecuale and pyriform sinuses with minimal vallecular residue. Recommend Dys 2 texture ( minced-ground texture) and thin liquids, SMALL sips, NO straws, pills whole in puree if small (crush if large). Full supervision and tactile cues for small sips.                           SLP Short Term Goals - 07/02/17 1627      SLP SHORT TERM GOAL #1   Title pt will demo sustained attention for 5 minutes adequate to complete basic cognitive linguistic task with occasional min A back to task x3 sessions   Time 4   Period Weeks   Status New     SLP SHORT TERM GOAL #2   Title pt will demo spontaneous swallow 25% of opportunites when cued re: anterior labial leakage   Time 4   Period Weeks   Status New     SLP SHORT TERM GOAL #3   Title pt will demo knowledge of calendar to tell SLP or family day/date/month/year   Time 4   Period Weeks   Status New     SLP SHORT TERM GOAL #4   Title pt will complete oral motor HEP with mod A, usually   Time 4   Period Weeks   Status New          SLP Long Term Goals - 07/02/17 1632      SLP LONG TERM GOAL #1   Title pt will demo sustained attention for 15 minutes adequate to complete simple cognitive linguistic task/s with rare min nonverbal cues back  to task   Time 8   Period Weeks   Status New     SLP LONG TERM GOAL #2   Title pt will complete oral motor HEP with mod A, occasionally   Time 8   Period Weeks   Status New          Plan - 07/02/17 1642    Clinical Impression Statement Pt presents with mod dysarthria, mild oropharyngeal dysphagia including anterior labial leakage, and signitificant cognitive linguistic deficits following thalamic infarct March 2018. Pt with HHST until approx 4 weeks ago;daughter has seen pt's oral skills decline in that time. Pt with (reported) mild dementia in terms of memory ("Some days you wouldn't even notice it." - daughter) premorbidly. P  Speech Therapy Frequency 2x / week   Duration --  8 weeks, but possible only 4 weeks, depending on progress with attention skills   Treatment/Interventions Oral motor exercises;Cueing hierarchy;Functional tasks;Patient/family education;Cognitive reorganization;Compensatory techniques;Internal/external aids;SLP instruction and feedback;Environmental controls   Potential to Achieve Goals Fair   Potential Considerations Co-morbidities;Severity of impairments;Ability to learn/carryover information;Previous level of function   Consulted and Agree with Plan of Care Patient;Family member/caregiver   Family Member Consulted daughter      Patient will benefit from skilled therapeutic intervention in order to improve the following deficits and impairments:   Cognitive communication deficit - Plan: SLP plan of care cert/re-cert  Dysarthria and anarthria - Plan: SLP plan of care cert/re-cert  Oropharyngeal dysphagia - Plan: SLP plan of care cert/re-cert      G-Codes - 47/82/95 1635    Functional Limitations Attention   Attention Current Status (A2130) At least 80 percent but less than 100 percent impaired, limited or restricted   Attention Goal Status (Q6578) At least 60 percent but less than 80 percent impaired, limited or restricted      Problem  List Patient Active Problem List   Diagnosis Date Noted  . Bacterial UTI 04/27/2017  . CHF (congestive heart failure) ([place])   . Fall   . Fever   . Benign essential HTN   . Labile blood pressure   . Prerenal azotemia   . Hemiparesis of left dominant side due to nontraumatic intracerebral hemorrhage ([place]) 02/24/2017  . IVH (intraventricular hemorrhage) ([place]) 02/23/2017  . Hypertensive emergency 02/23/2017  . Dysphagia due to recent stroke 02/23/2017  . Urinary retention 02/23/2017  . Dementia 02/23/2017  . Congestive heart failure ([place]) 02/23/2017  . Hypokalemia 02/23/2017  . Thalamic hemorrhage ([place]) 02/23/2017  . ICH (intracerebral hemorrhage) ([place]) - R thalamic hemorrhage 02/16/2017    [facility] ,MS, CCC-SLP  07/02/2017, 4:58 PM  [facility] [address] [place], [place], [address] Phone: [phone]   Fax:  [phone]  Name: MONTANA FASSNACHT MRN: [id number] Date of Birth: [birth year]

## 2017-07-02 NOTE — Therapy (Signed)
[facility] [address] [place], [place], [address] Phone: [phone]   Fax:  [phone]  Occupational Therapy Evaluation  Patient Details  Name: Anthony Fisher MRN: [id number] Date of Birth: [birth year] Referring Provider: Dr. Naaman Fisher  Encounter Date: 07/02/2017      OT End of Session - 07/02/17 1426    Visit Number 1   Number of Visits 17   Date for OT Re-Evaluation 09/01/17   Authorization Type [employer] MCR - G code required   Authorization - Visit Number 1   Authorization - Number of Visits 10   OT Start Time 7893   OT Stop Time 1405   OT Time Calculation (min) 50 min   Activity Tolerance Patient limited by lethargy      Past Medical History:  Diagnosis Date  . Adenomatous polyp of colon   . Dementia     Past Surgical History:  Procedure Laterality Date  . CATARACT EXTRACTION W/ INTRAOCULAR LENS  IMPLANT, BILATERAL      There were no vitals filed for this visit.      Subjective Assessment - 07/02/17 1320    Subjective  Daughter reports decline since home health therapies stopped about a month ago.    Patient is accompained by: Family member  daughter Anthony Fisher)    Pertinent History 02/15/17: Rt thalamic hemmorrhage, vascular dementia   Patient Stated Goals Per daughter: to increase his ability to care for self   Currently in Pain? No/denies           [facility] OT Assessment - 07/02/17 0001      Assessment   Diagnosis Rt thalamic hemmorhage   Referring Provider Dr. Naaman Fisher   Onset Date 02/15/17   Assessment Arrived in w/c. Pt demo Lt inattention, decreased ability to follow simple 1 step commands. Drooling with decr. awareness. Pt goes by "Anthony Fisher"   Prior Therapy inpatient rehab (3/20 - 03/18/17, SNF for 6 days, HH therapies for almost 2 months)     Precautions   Precautions Fall   Precaution Comments Pt now cleared for thin liquids,  but requires cues to slow down when eating     Balance Screen    Has the patient fallen in the past 6 months Yes   How many times? under 5  trying to get OOB or getting off chair (since stroke)   Has the patient had a decrease in activity level because of a fear of falling?  --  pt not fearful, but tries to get up without help/assist     Home  Environment   Bathroom Shower/Tub Tub/Shower unit;Curtain  whirlpool style, but sponge bathing mostly   [place] - 2 wheels;Bedside commode  w/c   Additional Comments Pt lives with son and granddaughter in 64 story ranch home, ramp to enter. Bathroom inaccesible with w/c. Pt has sitter stay with him when son or his daughter not there.    Lives With Son     Prior Function   Level of Independence Independent with basic ADLs  but did not drive, cook or perform cleaning   Vocation Retired     ADL   Eating/Feeding Needs assist with cutting food   Grooming --  Min assist - pt shaving w/ electric razor, assist to brush teeth   Upper Body Bathing --  Anthony Fisher assist, partly d/t time restraints by dtr   Lower Body Bathing Maximal assistance   Upper Body Dressing Maximal assistance  d/t time constraints by  dtr (caregiver)    Lower Body Dressing +1 Total aassistance   Toilet Transfer Moderate assistance  with cueing   Toilet Transer Equipment Bedside commode   Toileting - Clothing Manipulation + 1 Total assistance   Where Assessed - Toileting Clothing Manipulationn + 1 Total assistance   Tub/Shower Transfer --  n/a, pt is sponge bathing with assist   ADL comments Pt able to physically brush teeth, but family comes behind him for thoroughness. Anthony Fisher assist for bathing and dressing (partly d/t time restraints from caregiver (dtr) having to get to work in the am. Pt did not drive, cook or do cleaning prior to stroke, however could make simple snack, sandwich, or heat up something prior to stroke. Now dependent for all IADLS.      Mobility   Mobility Status Needs assist   Mobility Status Comments w/c primarily,  pt can walk short distances with assist     Written Expression   Dominant Hand Right     Vision - History   Baseline Vision Bifocals   Visual History Cataracts  surgery to remove. Also macular degeneration beginning     Vision Assessment   Comment To be further assessed. Decreased attention to Lt side noted. Pt could read clock time on wall     Cognition   Overall Cognitive Status Impaired/Different from baseline  pt had cognitive deficits before d/t dementia, but worse now     Observation/Other Assessments   Observations Pt with decr. sustained attention overall, pt lethargic during evaluation. Pt with Anthony Fisher cues to follow simple commands with demo when trying to assess ROM     Sensation   Additional Comments difficult to assess d/t cognitive deficits     Coordination   9 Hole Peg Test --  unable to perform Lt hand   Box and Blocks Rt = 12 (slower d/t decreased processing speed, and attention to task), Lt = 4 (pt ataxic, multiple drops)    Coordination Pt with Anthony Fisher difficulty picking up pen Lt hand and dropped from hand immediately d/t coordination, attention, and cognitive deficits; and ataxic     Perception   Perception Impaired   Inattention/Neglect Does not attend to left visual field     Praxis   Praxis Impaired     Edema   Edema none noted during eval     ROM / Strength   AROM / PROM / Strength AROM     AROM   Overall AROM Comments RUE WFL's except stiff and lacks high range shoulder flexion.  LUE: sh flexion to approx 80*, ER approx 75%, unable to fully assess d/t cognitive deficits. Distally pt demo ROM WFL's, but decreased control/coordination. When pt went into ER, pt had no distal control and hit face with hand however partly d/t inattention to Lt side of body     Hand Function   Right Hand Grip (lbs) 35   Left Hand Grip (lbs) 34                           OT Short Term Goals - 07/02/17 1433      OT SHORT TERM GOAL #1   Title Pt to  attend to self care task in quiet environment for 5 min. w/o rest   Time 4   Period Weeks   Status New   Target Date 08/01/17     OT SHORT TERM GOAL #2   Title Pt to attend to Lt side  of body during self care task with only min cueing   Time 4   Period Weeks   Status New     OT SHORT TERM GOAL #3   Title Pt to brush teeth completely with sufficient attention to perform thoroughly   Time 4   Period Weeks   Status New     OT SHORT TERM GOAL #4   Title Pt to perform UB dressing/bathing with mod assist   Time 4   Period Weeks   Status New     OT SHORT TERM GOAL #5   Title Pt/family independent with HEP for shoulder ROM and simple coordination tasks to perform Lt hand   Time 4   Period Weeks   Status New           OT Long Term Goals - 07/02/17 1436      OT LONG TERM GOAL #1   Title Pt to perform UB dressing/bathing with only min assist, mod cueing prn    Time 8   Period Weeks   Status New   Target Date 09/01/17     OT LONG TERM GOAL #2   Title Pt to perform LB dressing/bathing with mod assist, mod cueing prn   Time 8   Period Weeks   Status New     OT LONG TERM GOAL #3   Title Pt to perform toilet transfer using DME prn with min physical assist   Time 8   Period Weeks   Status New     OT LONG TERM GOAL #4   Title Pt to perform clothes management with mod assist, mod cueing prn   Time 8   Period Weeks   Status New     OT LONG TERM GOAL #5   Title Pt to improve function LUE as evidenced by increasing Box & Blocks score by 4 blocks or more LUE   Baseline Eval = 4   Time 8   Period Weeks   Status New     Long Term Additional Goals   Additional Long Term Goals Yes     OT LONG TERM GOAL #6   Title Pt to demo increased Lt shoulder ROM with distal control to help don shirt and comb hair   Time 8   Period Weeks   Status New               Plan - 07/02/17 1427    Clinical Impression Statement Pt is a 83 y.o. male who presents to outpatient rehab  s/p Rt thalamic hemmorrhage on 02/15/17 with multiple deficits including: hemiplegia Lt side, decreased attention and cognition, visual/perceptual deficits, ataxia, apraxia, decreased ability to transfer, decreased awareness into deficits which has impacted pt's ability to perform self care needs.    Occupational Profile and client history currently impacting functional performance Multiple impairments (both physical and cognitive) limiting ability to perform ADLS and mobility. Pt also with PMH of vascular dementia   Occupational performance deficits (Please refer to evaluation for details): ADL's;IADL's;Rest and Sleep;Leisure;Social Participation   Rehab Potential Fair   Current Impairments/barriers affecting progress: severity of deficits both physical and cognitive   OT Frequency 2x / week   OT Duration 8 weeks   OT Treatment/Interventions Self-care/ADL training;DME and/or AE instruction;Splinting;Patient/family education;Therapeutic exercises;Therapeutic activities;Neuromuscular education;Functional Mobility Training;Cognitive remediation/compensation;Passive range of motion;Electrical Stimulation;Parrafin;Moist Heat;Manual Therapy;Visual/perceptual remediation/compensation   Clinical Decision Making Several treatment options, min-mod task modification necessary   Consulted and Agree with Plan of Care Patient;Family member/caregiver  Family Member Consulted daughter Anthony Fisher)      Patient will benefit from skilled therapeutic intervention in order to improve the following deficits and impairments:  Decreased coordination, Decreased range of motion, Impaired flexibility, Impaired sensation, Decreased safety awareness, Decreased endurance, Improper spinal/pelvic alignment, Decreased knowledge of precautions, Impaired tone, Decreased balance, Decreased knowledge of use of DME, Impaired UE functional use, Pain, Decreased cognition, Decreased mobility, Decreased strength, Impaired perceived functional  ability, Impaired vision/preception  Visit Diagnosis: Hemiplegia and hemiparesis following cerebral infarction affecting left non-dominant side ([place]) - Plan: Ot plan of care cert/re-cert  Neurological neglect syndrome - Plan: Ot plan of care cert/re-cert  Apraxia - Plan: Ot plan of care cert/re-cert  Visuospatial deficit - Plan: Ot plan of care cert/re-cert  Unsteadiness on feet - Plan: Ot plan of care cert/re-cert  Other symptoms and signs involving cognitive functions following nontraumatic intracerebral hemorrhage - Plan: Ot plan of care cert/re-cert      G-Codes - 70/14/10 1432    Functional Limitation Self care   Self Care Current Status [phone]) At least 80 percent but less than 100 percent impaired, limited or restricted   Self Care Goal Status (Y3888) At least 60 percent but less than 80 percent impaired, limited or restricted      Problem List Patient Active Problem List   Diagnosis Date Noted  . Bacterial UTI 04/27/2017  . CHF (congestive heart failure) ([place])   . Fall   . Fever   . Benign essential HTN   . Labile blood pressure   . Prerenal azotemia   . Hemiparesis of left dominant side due to nontraumatic intracerebral hemorrhage ([place]) 02/24/2017  . IVH (intraventricular hemorrhage) ([place]) 02/23/2017  . Hypertensive emergency 02/23/2017  . Dysphagia due to recent stroke 02/23/2017  . Urinary retention 02/23/2017  . Dementia 02/23/2017  . Congestive heart failure ([place]) 02/23/2017  . Hypokalemia 02/23/2017  . Thalamic hemorrhage ([place]) 02/23/2017  . ICH (intracerebral hemorrhage) ([place]) - R thalamic hemorrhage 02/16/2017    Carey Bullocks, OTR/L 07/02/2017, 2:45 PM  [facility] [address] [place], [place], [address] Phone: [phone]   Fax:  [phone]  Name: Anthony Fisher MRN: [id number] Date of Birth: [birth year]

## 2017-07-12 ENCOUNTER — Ambulatory Visit: Payer: Medicare Other | Admitting: Speech Pathology

## 2017-07-12 ENCOUNTER — Ambulatory Visit: Payer: Medicare Other | Admitting: Occupational Therapy

## 2017-07-16 ENCOUNTER — Encounter: Payer: Self-pay | Admitting: Occupational Therapy

## 2017-07-16 ENCOUNTER — Ambulatory Visit: Payer: Self-pay | Admitting: Physical Therapy

## 2017-07-16 ENCOUNTER — Encounter: Payer: Self-pay | Admitting: Physical Therapy

## 2017-07-16 NOTE — Therapy (Signed)
[facility] [address] [place], [place], [address] Phone: [phone]   Fax:  [phone]  Patient Details  Name: Anthony Fisher MRN: [id number] Date of Birth: Jul 19, 1926 Referring Provider:  No ref. provider found  Encounter Date: 07/16/2017   Discharge Summary  PHYSICAL THERAPY DISCHARGE SUMMARY  Visits from Start of Care: 1  Current functional level related to goals / functional outcomes: Unchanged; eval only and pt then cancelled all future appts   Remaining deficits: unchanged   Education / Equipment: NA  Plan: Patient agrees to discharge.  Patient goals were not met. Patient is being discharged due to the patient's request.  ?????        Rexanne Mano, PT 07/16/2017, 7:56 AM  [facility] [address] [place], [place], [address] Phone: [phone]   Fax:  [phone]

## 2017-07-19 ENCOUNTER — Encounter: Payer: Self-pay | Admitting: Occupational Therapy

## 2017-07-23 ENCOUNTER — Ambulatory Visit: Payer: Self-pay | Admitting: Physical Therapy

## 2017-07-26 ENCOUNTER — Ambulatory Visit: Payer: Self-pay | Admitting: Physical Therapy

## 2017-07-26 ENCOUNTER — Encounter: Payer: Self-pay | Admitting: Speech Pathology

## 2017-07-26 ENCOUNTER — Encounter: Payer: Self-pay | Admitting: Occupational Therapy

## 2017-07-30 ENCOUNTER — Ambulatory Visit: Payer: Self-pay | Admitting: Physical Therapy

## 2017-08-01 IMAGING — RF DG SWALLOWING FUNCTION
1 series · 17 of 24 positions shown · non-contrast
Comparison: None.

CLINICAL DATA: CVA.  Difficulty swallowing.

EXAM:
MODIFIED BARIUM SWALLOW
TECHNIQUE: Different consistencies of barium were administered orally to the
patient by the Speech Pathologist. Imaging of the pharynx was
performed in the lateral projection.
FLUOROSCOPY TIME:  Fluoroscopy Time:  1 minutes and 29 seconds
Radiation Exposure Index (if provided by the fluoroscopic device):
17 mGy
Number of Acquired Spot Images: 0

[Series 1: run · 17 acquisitions, 17 frames shown]
[im 1/17]
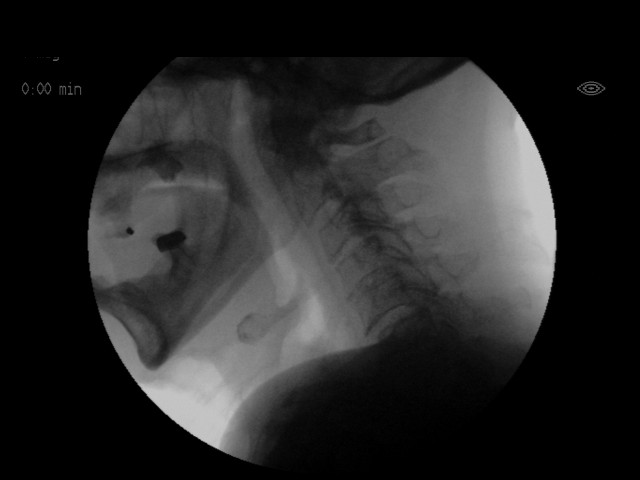
[im 2/17]
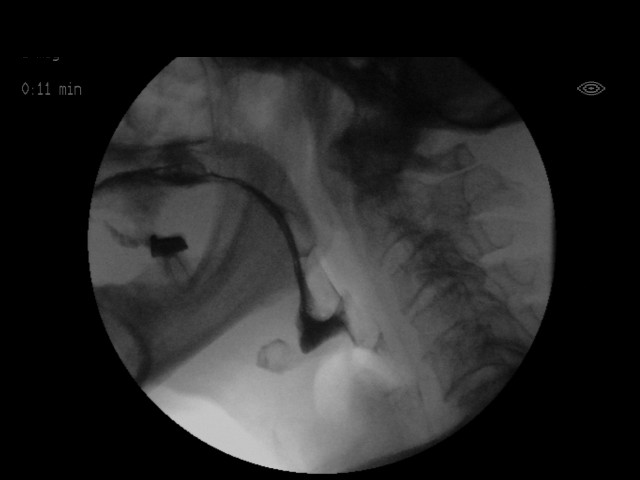
[im 3/17]
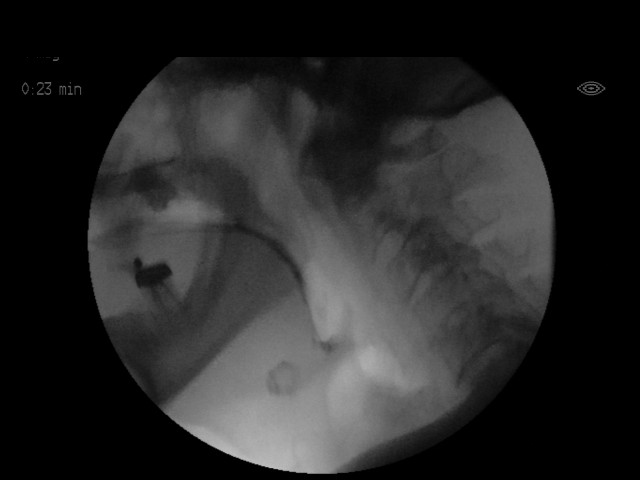
[im 4/17]
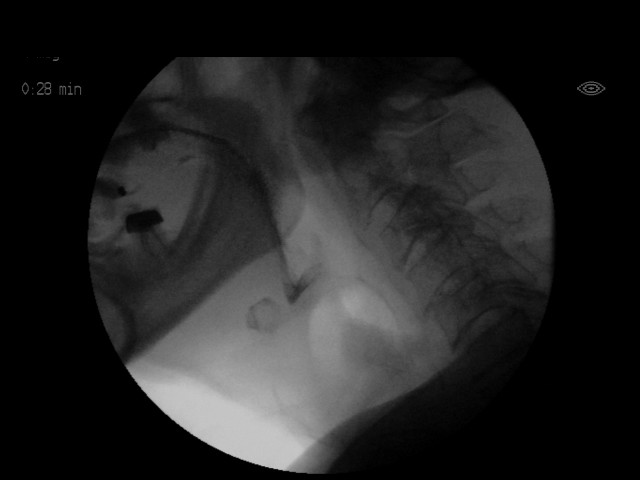
[im 5/17]
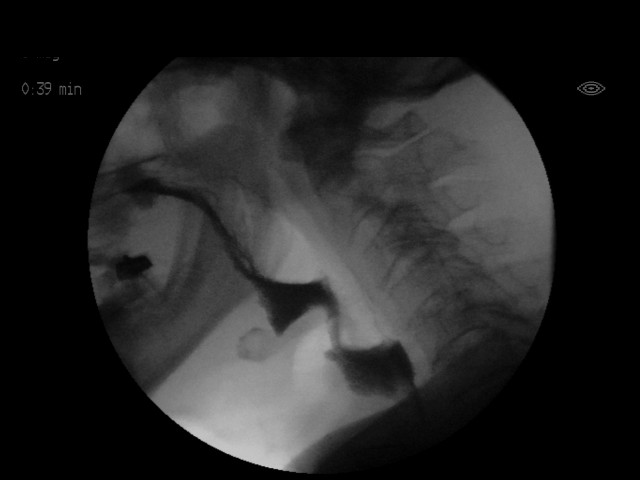
[im 6/17]
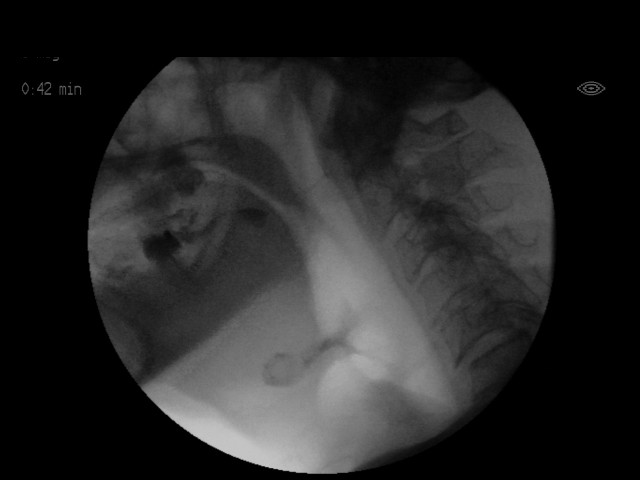
[im 7/17]
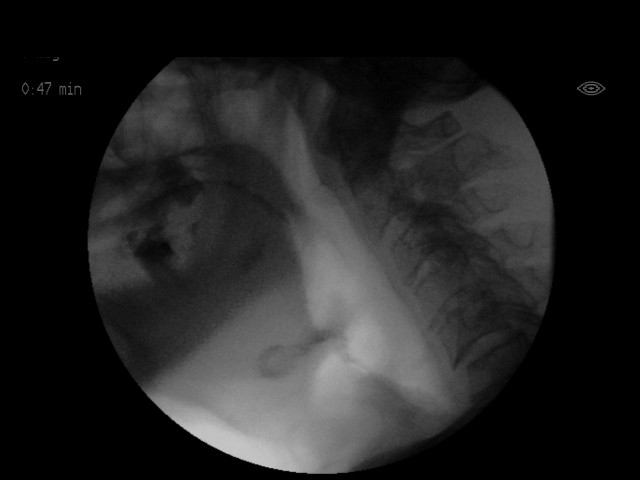
[im 8/17]
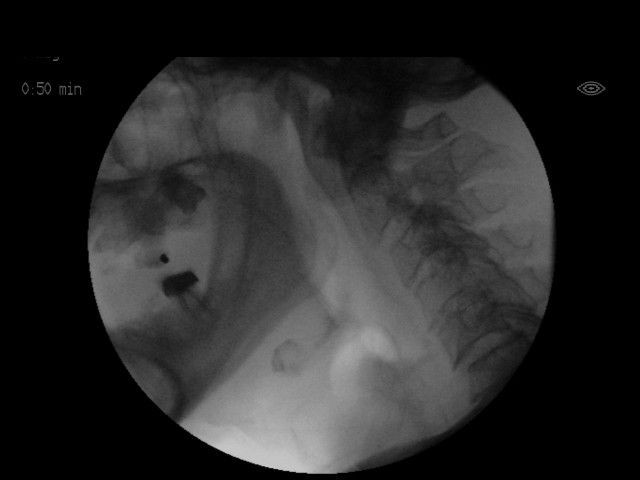
[im 9/17]
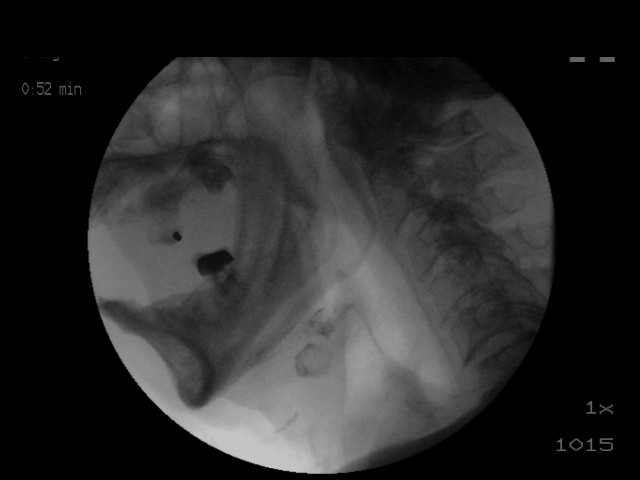
[im 10/17]
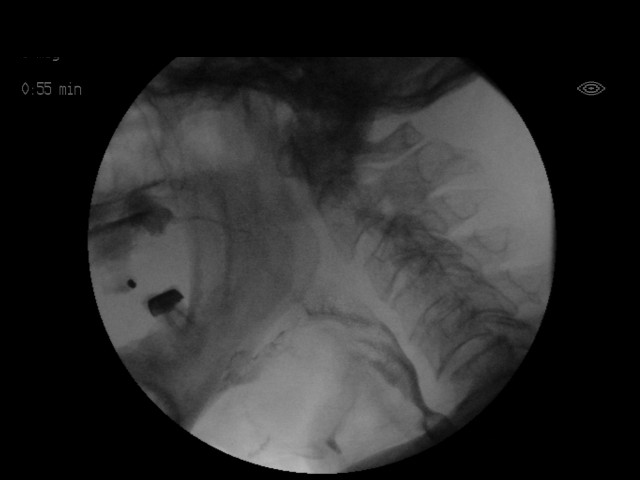
[im 11/17]
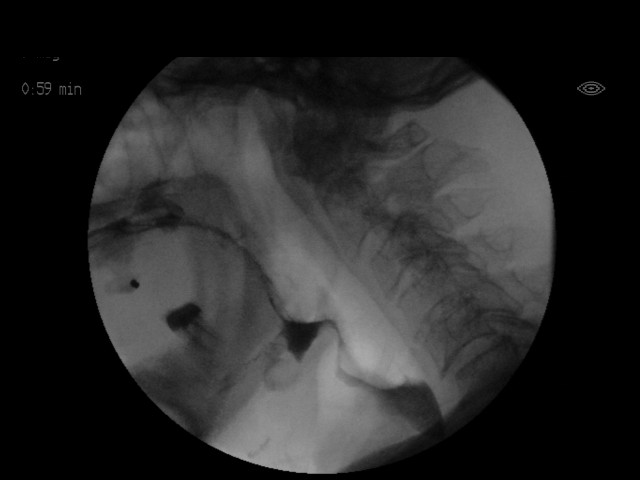
[im 12/17]
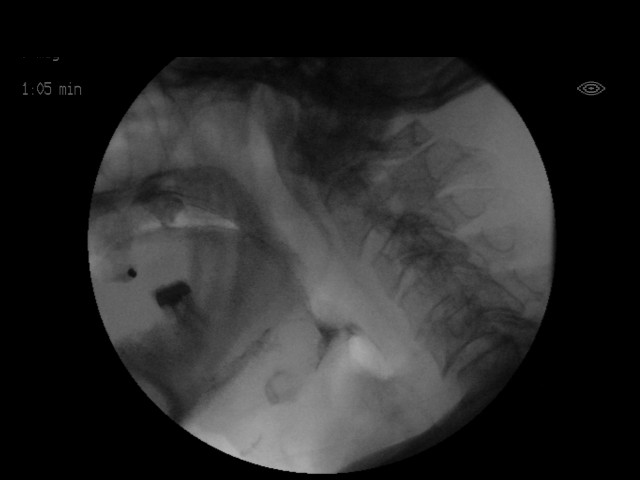
[im 13/17]
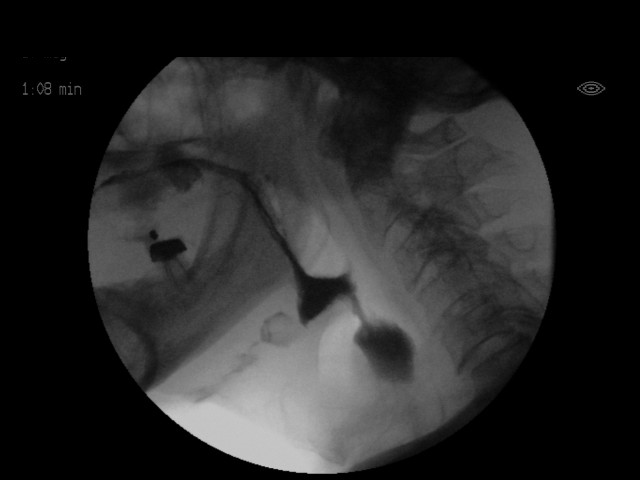
[im 14/17]
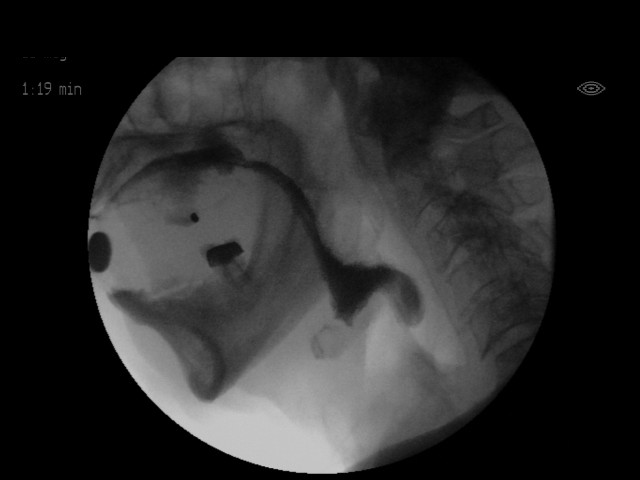
[im 15/17]
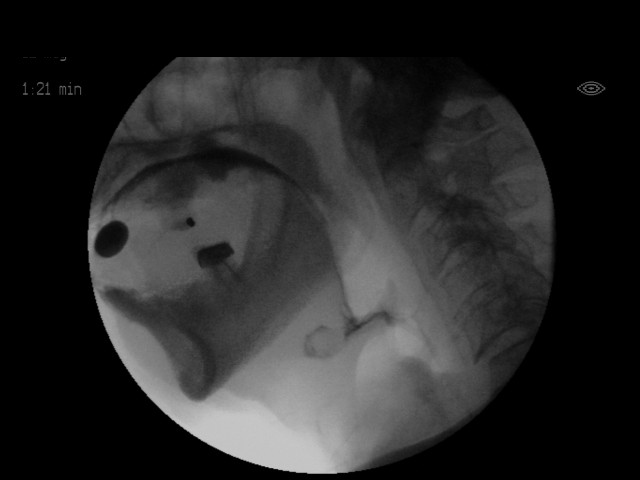
[im 16/17]
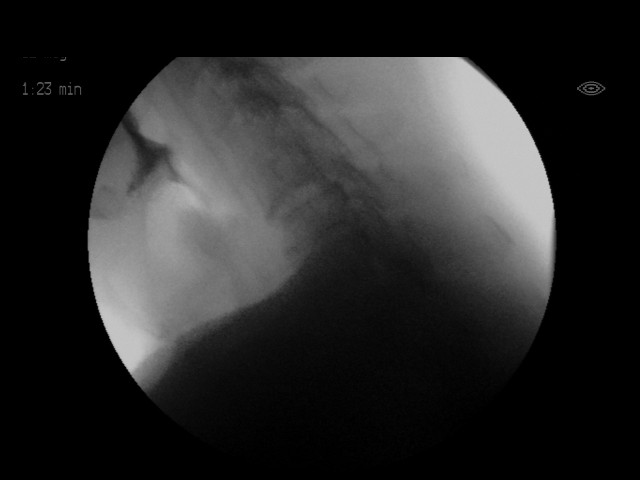
[im 17/17]
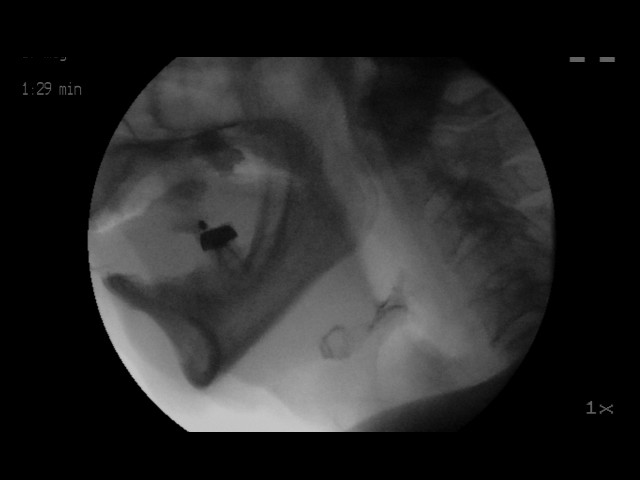

[17 of 24 positions shown; findings below may reference images not displayed]

FINDINGS: Thin liquid- delayed swallow trigger. Episodes of flash penetration
and 1 episode of frank aspiration with the larger volumes swallows.

Nectar thick liquid- mild delayed oral transit and delayed swallow
trigger. Vallecular retention noted.

Honey- not perform

Tiger?Monder not performed

Pure with Soft Cracker-delayed oral transit. Mild oral and
vallecular residual.

Tiger?Aujla with hard cracker- not performed

Barium tablet -  poor oral transit.  Unable to swallow.
IMPRESSION: Delayed oral transit, delayed swallow trigger and oral and
vallecular residual.

Episodes of laryngeal penetration and frank aspiration with larger
volume thin liquid.

Please refer to the Speech Pathologists report for complete details
and recommendations.

## 2017-08-02 ENCOUNTER — Ambulatory Visit: Payer: Self-pay | Admitting: Physical Therapy

## 2017-08-02 ENCOUNTER — Encounter: Payer: Self-pay | Admitting: Speech Pathology

## 2017-08-02 ENCOUNTER — Encounter: Payer: Self-pay | Admitting: Occupational Therapy

## 2017-08-06 ENCOUNTER — Ambulatory Visit: Payer: Self-pay | Admitting: Physical Therapy

## 2017-08-13 ENCOUNTER — Ambulatory Visit: Payer: Medicare Other | Admitting: Occupational Therapy

## 2017-08-13 ENCOUNTER — Ambulatory Visit: Payer: Self-pay | Admitting: Physical Therapy

## 2017-08-16 ENCOUNTER — Ambulatory Visit: Payer: Self-pay | Admitting: Physical Therapy

## 2017-08-16 ENCOUNTER — Encounter: Payer: Self-pay | Admitting: Speech Pathology

## 2017-08-16 ENCOUNTER — Encounter: Payer: Self-pay | Admitting: Occupational Therapy

## 2017-08-17 ENCOUNTER — Encounter: Payer: Medicare Other | Admitting: Physical Medicine & Rehabilitation

## 2017-08-20 ENCOUNTER — Ambulatory Visit: Payer: Self-pay | Admitting: Physical Therapy

## 2017-08-20 ENCOUNTER — Encounter: Payer: Self-pay | Admitting: Occupational Therapy

## 2017-08-23 ENCOUNTER — Ambulatory Visit: Payer: Self-pay | Admitting: Physical Therapy

## 2017-08-23 ENCOUNTER — Encounter: Payer: Self-pay | Admitting: Occupational Therapy

## 2017-08-23 ENCOUNTER — Encounter: Payer: Self-pay | Admitting: Speech Pathology

## 2017-08-23 NOTE — Therapy (Signed)
[facility] [address] [place], [place], [address] Phone: [phone]   Fax:  [phone]  Patient Details  Name: Anthony Fisher MRN: [id number] Date of Birth: 1926-03-26 Referring Provider:  Dr. Naaman Plummer Encounter Date: 08/23/2017  Pt came for evaluation on 07/02/17 and then wanted to cancel all appointments, therefore will d/c episode of care at this time per pt request. Pt has not met any goals.  Carey Bullocks, OTR/L 08/23/2017, 11:54 AM  [facility] [address] [place], [place], [address] Phone: [phone]   Fax:  [phone]

## 2017-08-30 ENCOUNTER — Encounter: Payer: Self-pay | Admitting: Occupational Therapy

## 2017-08-30 ENCOUNTER — Ambulatory Visit: Payer: Self-pay | Admitting: Physical Therapy

## 2017-08-30 ENCOUNTER — Encounter: Payer: Self-pay | Admitting: Speech Pathology

## 2017-09-03 ENCOUNTER — Encounter: Payer: Self-pay | Admitting: Occupational Therapy

## 2017-09-03 ENCOUNTER — Ambulatory Visit: Payer: Self-pay | Admitting: Physical Therapy

## 2017-09-06 ENCOUNTER — Encounter: Payer: Self-pay | Admitting: Speech Pathology

## 2017-09-13 ENCOUNTER — Encounter: Payer: Self-pay | Admitting: Speech Pathology

## 2017-09-15 ENCOUNTER — Encounter: Payer: Medicare Other | Admitting: Physical Medicine & Rehabilitation

## 2017-09-20 ENCOUNTER — Encounter: Payer: Self-pay | Admitting: Speech Pathology

## 2017-09-27 ENCOUNTER — Encounter: Payer: Self-pay | Admitting: Speech Pathology

## 2017-10-25 ENCOUNTER — Ambulatory Visit: Payer: Medicare Other | Admitting: Neurology

## 2017-12-14 ENCOUNTER — Ambulatory Visit: Payer: Self-pay | Admitting: Neurology

## 2018-01-25 ENCOUNTER — Ambulatory Visit: Payer: Self-pay | Admitting: Neurology

## 2018-03-10 ENCOUNTER — Ambulatory Visit: Payer: Self-pay | Admitting: Neurology

## 2018-05-17 ENCOUNTER — Other Ambulatory Visit: Payer: Self-pay

## 2018-05-17 ENCOUNTER — Emergency Department (HOSPITAL_COMMUNITY): Payer: Medicare Other

## 2018-05-17 ENCOUNTER — Emergency Department (HOSPITAL_COMMUNITY)
Admission: EM | Admit: 2018-05-17 | Discharge: 2018-05-18 | Disposition: A | Discharge status: Home / Self Care | Payer: Medicare Other | Attending: Emergency Medicine | Admitting: Emergency Medicine

## 2018-05-17 ENCOUNTER — Encounter (HOSPITAL_COMMUNITY): Payer: Self-pay | Admitting: Radiology

## 2018-05-17 DIAGNOSIS — R451 Restlessness and agitation: Secondary | ICD-10-CM | POA: Insufficient documentation

## 2018-05-17 DIAGNOSIS — R4701 Aphasia: Secondary | ICD-10-CM | POA: Diagnosis present

## 2018-05-17 DIAGNOSIS — I110 Hypertensive heart disease with heart failure: Secondary | ICD-10-CM | POA: Insufficient documentation

## 2018-05-17 DIAGNOSIS — R404 Transient alteration of awareness: Secondary | ICD-10-CM | POA: Diagnosis not present

## 2018-05-17 DIAGNOSIS — F0390 Unspecified dementia without behavioral disturbance: Secondary | ICD-10-CM | POA: Insufficient documentation

## 2018-05-17 DIAGNOSIS — R5383 Other fatigue: Secondary | ICD-10-CM | POA: Insufficient documentation

## 2018-05-17 DIAGNOSIS — I509 Heart failure, unspecified: Secondary | ICD-10-CM | POA: Insufficient documentation

## 2018-05-17 DIAGNOSIS — Z7982 Long term (current) use of aspirin: Secondary | ICD-10-CM | POA: Diagnosis not present

## 2018-05-17 DIAGNOSIS — Z79899 Other long term (current) drug therapy: Secondary | ICD-10-CM | POA: Diagnosis not present

## 2018-05-17 LAB — COMPREHENSIVE METABOLIC PANEL
ALT: 8 U/L — AB (ref 17–63)
ANION GAP: 9 (ref 5–15)
AST: 17 U/L (ref 15–41)
Albumin: 3.6 g/dL (ref 3.5–5.0)
Alkaline Phosphatase: 59 U/L (ref 38–126)
BUN: 25 mg/dL — ABNORMAL HIGH (ref 6–20)
CHLORIDE: 105 mmol/L (ref 101–111)
CO2: 24 mmol/L (ref 22–32)
CREATININE: 0.98 mg/dL (ref 0.61–1.24)
Calcium: 8.8 mg/dL — ABNORMAL LOW (ref 8.9–10.3)
GFR calc non Af Amer: >60 mL/min (ref >60)
Glucose, Bld: 97 mg/dL (ref 65–99)
POTASSIUM: 3.5 mmol/L (ref 3.5–5.1)
SODIUM: 138 mmol/L (ref 135–145)
Total Bilirubin: 0.8 mg/dL (ref 0.3–1.2)
Total Protein: 6 g/dL — ABNORMAL LOW (ref 6.5–8.1)

## 2018-05-17 LAB — I-STAT TROPONIN, ED: TROPONIN I, POC: 0 ng/mL (ref 0.00–0.08)

## 2018-05-17 LAB — LIPASE, BLOOD: LIPASE: 40 U/L (ref 11–51)

## 2018-05-17 LAB — I-STAT CG4 LACTIC ACID, ED: LACTIC ACID, VENOUS: 1.73 mmol/L (ref 0.5–1.9)

## 2018-05-17 MED ORDER — SODIUM CHLORIDE 0.9 % IV SOLN: 1000.0000 mL | INTRAVENOUS | Status: DC

## 2018-05-17 NOTE — ED Notes (Signed)
Pt family at bedside states pt is now back at baseline.

## 2018-05-17 NOTE — ED Notes (Signed)
Patient transported to CT 

## 2018-05-17 NOTE — ED Triage Notes (Signed)
LKW 1730. Pt family was talking with pt. Later at unknown time, pt's family member moving him to bed and "pt is no longer able to talk" per EMS. Hx of Alzheimers, stroke in 2017, L sided deficits present. CBG 150. 190/102, 99% room air, HR 75. NAD. EMS reports no change from baseline neuro status in route.

## 2018-05-17 NOTE — ED Provider Notes (Signed)
[facility] EMERGENCY DEPARTMENT Provider Note   CSN: [id number] Arrival date & time: 05/17/18  2146     History   Chief Complaint Chief Complaint  Patient presents with  . Altered Mental Status    HPI Anthony Fisher is a 84 y.o. male with a hx of no cerebral hemorrhage in the right thalamic area (2017), hypertensive emergency, dysphasia, dementia, congestive heart failure, hemiparesis of the left side presents to the Emergency Department via EMS for aphasia.  Level 5 caveat for dementia and aphasia.  Per EMS history, patient's last known well was 1730 when his family was talking with him.  Later they realized patient was no longer able to talk but did not note the time.  They report baseline left-sided neuro deficits.  Patient hypertensive in route with EMS at 190/102.  Son and granddaughter at bedside supply the rest of the history.  Granddaughter is adamant that patient was well at 56.  She reports she handed him a Coca[employer]Cola which he was able to drink without difficulty.  They state that patient is normally oriented to person, follows commands, walks with assistance and is able to feed himself.  They report that tonight at 830 when they attempted to get him into bed he was more lethargic.  They report that he did not know who he was and when his name was called he did not acknowledge that he was being spoken to.  They report this is unusual as he will almost always make eye contact when his name is called.  They report that he was at no point unconscious.  No seizure activity.  Patient did not fall or hit his head.  They report that during this episode he was unable to follow commands.  They deny no recent fevers or illnesses.  No vomiting or diarrhea.  No foul-smelling urine.  Per family, patient takes no medications whatsoever.  The history is provided by the EMS personnel and medical records. No language interpreter was used.    Past Medical History:    Diagnosis Date  . Adenomatous polyp of colon   . Dementia     Patient Active Problem List   Diagnosis Date Noted  . Bacterial UTI 04/27/2017  . CHF (congestive heart failure) ([place])   . Fall   . Fever   . Benign essential HTN   . Labile blood pressure   . Prerenal azotemia   . 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 IVH (intraventricular hemorrhage) ([place]) 02/23/2017  . Hypertensive emergency 02/23/2017  . Dysphagia due to recent stroke 02/23/2017  . Urinary retention 02/23/2017  . Dementia 02/23/2017  . Congestive heart failure ([place]) 02/23/2017  . Hypokalemia 02/23/2017  . Thalamic hemorrhage ([place]) 02/23/2017  . ICH (intracerebral hemorrhage) ([place]) - R thalamic hemorrhage 02/16/2017    Past Surgical History:  Procedure Laterality Date  . CATARACT EXTRACTION W/ INTRAOCULAR LENS  IMPLANT, BILATERAL          Home Medications    Prior to Admission medications   Medication Sig Start Date End Date Taking? Authorizing Provider  aspirin 81 MG chewable tablet Chew by mouth daily.    [provider]  levofloxacin (LEVAQUIN) 25 MG/ML solution Take 30 mLs (750 mg total) by mouth daily. 05/18/18   Hady Niemczyk, Jarrett Soho, PA-C  tamsulosin (FLOMAX) 0.4 MG CAPS capsule Take 1 capsule (0.4 mg total) by mouth daily after supper. 03/18/17   Bary Leriche, PA-C  Family History Family History  Problem Relation Age of Onset  . Dementia Mother   . Heart attack Father     Social History Social History   Tobacco Use  . Smoking status: Never Smoker  . Smokeless tobacco: Never Used  Substance Use Topics  . Alcohol use: No  . Drug use: No     Allergies   Tape   Review of Systems Review of Systems  Unable to perform ROS: Dementia     Physical Exam Updated Vital Signs BP (!) 171/108 (BP Location: Right Arm)   Pulse 90   Temp (!) 97.5 F (36.4 C) (Oral)   Resp 16   SpO2 100%   Physical Exam   Constitutional: He appears well-developed and well-nourished. No distress.  Patient makes eye contact when his name is called.    HENT:  Head: Normocephalic and atraumatic.  No contusions or ecchymosis Patient is drooling but family reports this is normal.  Eyes: Pupils are equal, round, and reactive to light. Conjunctivae and EOM are normal. No scleral icterus.  Neck: Normal range of motion. Neck supple.  Cardiovascular: Normal rate, regular rhythm and intact distal pulses.  No murmur heard. Pulmonary/Chest: Effort normal. No accessory muscle usage. No tachypnea. No respiratory distress. He has no rhonchi. He has rales in the left lower field.  No cough on exam  Abdominal: Soft. Bowel sounds are normal.  Musculoskeletal: He exhibits no edema or tenderness.  Neurological: He is alert.  Patient makes eye contact when his name is called.  He follows simple, one-step commands.   Patient moves all 4 extremities but has significant residual weakness on the left.  Right arm and leg with 5/5 strength.  Skin: Skin is warm and dry. No rash noted. No erythema. No pallor.  Psychiatric: He has a normal mood and affect.  Nursing note and vitals reviewed.    ED Treatments / Results  Labs (all labs ordered are listed, but only abnormal results are displayed) Labs Reviewed  COMPREHENSIVE METABOLIC PANEL - Abnormal; Notable for the following components:      Result Value   BUN 25 (*)    Calcium 8.8 (*)    Total Protein 6.0 (*)    ALT 8 (*)    All other components within normal limits  URINALYSIS, ROUTINE W REFLEX MICROSCOPIC - Abnormal; Notable for the following components:   Hgb urine dipstick SMALL (*)    All other components within normal limits  CBC WITH DIFFERENTIAL/PLATELET - Abnormal; Notable for the following components:   WBC 11.6 (*)    RBC 3.55 (*)    Hemoglobin 11.7 (*)    HCT 34.5 (*)    Neutro Abs 8.7 (*)    All other components within normal limits  CULTURE, BLOOD (ROUTINE  X 2)  CULTURE, BLOOD (ROUTINE X 2)  URINE CULTURE  LIPASE, BLOOD  CBC WITH DIFFERENTIAL/PLATELET  CBC WITH DIFFERENTIAL/PLATELET  I-STAT CG4 LACTIC ACID, ED  I-STAT TROPONIN, ED    EKG EKG Interpretation  Date/Time:  Tuesday May 17 2018 21:59:23 EDT Ventricular Rate:  69 PR Interval:    QRS Duration: 164 QT Interval:  473 QTC Calculation: 507 R Axis:   -66 Text Interpretation:  Sinus rhythm Multiple premature complexes, vent & supraven RBBB and LAFB No STEMI Confirmed by Nanda Quinton [phone]) on 05/17/2018 10:24:11 PM   Radiology Dg Chest 2 View  Result Date: 05/18/2018 CLINICAL DATA:  Chest pain and altered mental status EXAM: CHEST - 2 VIEW COMPARISON:  02/24/2017 FINDINGS: Heart size is top-normal. Atherosclerotic tortuous aorta. No pulmonary consolidation or CHF. No effusion or pneumothorax. No acute osseous abnormality. IMPRESSION: Aortic atherosclerosis.  No active pulmonary disease. Electronically Signed   By: Ashley Royalty M.D.   On: 05/18/2018 00:04   Ct Head Wo Contrast  Result Date: 05/17/2018 CLINICAL DATA:  Altered mental status EXAM: CT HEAD WITHOUT CONTRAST TECHNIQUE: Contiguous axial images were obtained from the base of the skull through the vertex without intravenous contrast. COMPARISON:  05/31/2017 FINDINGS: Brain: There is no mass, hemorrhage or extra-axial collection. There is generalized atrophy without lobar predilection. There is no acute or chronic infarction. Small, old infarcts of the right basal ganglia and left insula. There is hypoattenuation of the periventricular white matter, most commonly indicating chronic ischemic microangiopathy. Vascular: Atherosclerotic calcification of the internal carotid arteries at the skull base. No abnormal hyperdensity of the major intracranial arteries or dural venous sinuses. Skull: The visualized skull base, calvarium and extracranial soft tissues are normal. Sinuses/Orbits: No fluid levels or advanced mucosal thickening of  the visualized paranasal sinuses. No mastoid or middle ear effusion. The orbits are normal. IMPRESSION: Chronic ischemic microangiopathy without acute intracranial abnormality. Electronically Signed   By: Ulyses Jarred M.D.   On: 05/17/2018 23:50    Procedures Procedures (including critical care time)  Medications Ordered in ED Medications  0.9 %  sodium chloride infusion (has no administration in time range)     Initial Impression / Assessment and Plan / ED Course  I have reviewed the triage vital signs and the nursing notes.  Pertinent labs & imaging results that were available during my care of the patient were reviewed by me and considered in my medical decision making (see chart for details).     Patient presents with transient alteration of his mental status.  Patient last seen well 7 PM, at 830 he was altered and on my assessment at 10:30 PM he was returning to baseline.  He was able to tell me his name and follow commands.  Patient is afebrile without tachycardia.  Patient is hypertensive on arrival however he is agitated.  Pressures have improved throughout his time here in the emergency department.  BP (!) 155/85   Pulse 79   Temp (!) 97.5 F (36.4 C) (Oral)   Resp 14   Ht 5\' 11"  (1.803 m)   Wt 83.9 kg (185 lb)   SpO2 99%   BMI 25.80 kg/m   Patient has a previous intracranial hemorrhage and no specific signs of intracranial hemorrhage today.  Lab work is largely reassuring.  No evidence of urinary tract infection, negative troponin.  EKG without evidence of STEMI.  No evidence of acute renal failure.  Mild anemia is baseline.  Patient does have mild, nonspecific leukocytosis of 11.6.  Without evidence of pneumonia, [place] edema or pneumothorax.  I personally evaluated these images.  He had is without acute abnormalities.  02:00  AM Patient has returned to his baseline.  He is following commands.  Family is in the room.  No specific findings on work-up today for etiology  of patient's altered mental status.  Long discussion with family about admission versus discharge.  I have offered admission for further evaluation of his transient altered mental status.  At this time, patient's family wishes to take him home.  I think this is reasonable.  Patient did have some focal breath sounds in the left lower lobe and mild leukocytosis.  Will treat with Levaquin for possible  aspiration pneumonia as he does have some trouble swallowing.  She is afebrile and there are no signs of sepsis.  I have discussed with family reasons to return immediately to the emergency department including return of altered mental status, fevers, vomiting, difficulty breathing or any other concerns.  He states understanding and is in agreement with this plan.  The patient was discussed with and seen by Dr. Randal Buba who agrees with the treatment plan.   Final Clinical Impressions(s) / ED Diagnoses   Final diagnoses:  Transient alteration of awareness    ED Discharge Orders        Ordered    levofloxacin (LEVAQUIN) 750 MG tablet  Daily,   Status:  Discontinued     05/18/18 0208    levofloxacin (LEVAQUIN) 25 MG/ML solution  Daily     05/18/18 0213       [place] Rosamond, Jarrett Soho, PA-C 05/18/18 0340    Daleen Bo, MD 05/19/18 1334

## 2018-05-18 LAB — URINALYSIS, ROUTINE W REFLEX MICROSCOPIC
Bacteria, UA: NONE SEEN
Bilirubin Urine: NEGATIVE
Glucose, UA: NEGATIVE mg/dL
Ketones, ur: NEGATIVE mg/dL
Leukocytes, UA: NEGATIVE
Nitrite: NEGATIVE
PROTEIN: NEGATIVE mg/dL
Specific Gravity, Urine: 1.019 (ref 1.005–1.030)
pH: 5 (ref 5.0–8.0)

## 2018-05-18 LAB — CBC WITH DIFFERENTIAL/PLATELET
Abs Immature Granulocytes: 0 10*3/uL (ref 0.0–0.1)
BASOS ABS: 0.1 10*3/uL (ref 0.0–0.1)
Basophils Relative: 1 %
EOS ABS: 0.1 10*3/uL (ref 0.0–0.7)
EOS PCT: 1 %
HCT: 34.5 % — ABNORMAL LOW (ref 39.0–52.0)
Hemoglobin: 11.7 g/dL — ABNORMAL LOW (ref 13.0–17.0)
Immature Granulocytes: 0 %
LYMPHS ABS: 1.8 10*3/uL (ref 0.7–4.0)
Lymphocytes Relative: 15 %
MCH: 33 pg (ref 26.0–34.0)
MCHC: 33.9 g/dL (ref 30.0–36.0)
MCV: 97.2 fL (ref 78.0–100.0)
Monocytes Absolute: 0.9 10*3/uL (ref 0.1–1.0)
Monocytes Relative: 8 %
Neutro Abs: 8.7 10*3/uL — ABNORMAL HIGH (ref 1.7–7.7)
Neutrophils Relative %: 75 %
Platelets: 209 10*3/uL (ref 150–400)
RBC: 3.55 MIL/uL — AB (ref 4.22–5.81)
RDW: 12.9 % (ref 11.5–15.5)
WBC: 11.6 10*3/uL — AB (ref 4.0–10.5)

## 2018-05-18 MED ORDER — LEVOFLOXACIN 750 MG PO TABS: 750.0000 mg | ORAL_TABLET | Freq: Every day | ORAL | 0 refills | Status: DC

## 2018-05-18 MED ORDER — LEVOFLOXACIN 25 MG/ML PO SOLN: 750.0000 mg | Freq: Every day | ORAL | 0 refills | Status: DC

## 2018-05-18 NOTE — ED Notes (Signed)
Pt family states that they do not want to wait for PTAR and will be able to get pt into home safely together

## 2018-05-18 NOTE — Discharge Instructions (Addendum)
1. Medications: usual home medications 2. Treatment: rest, drink plenty of fluids,  3. Follow Up: Please followup with your primary doctor in 1-2 days for discussion of your diagnoses and further evaluation after today's visit; if you do not have a primary care doctor use the resource guide provided to find one; Please return to the ER immediately for return of symptoms, continued or worsening confusion, fevers, vomiting or other concerns

## 2018-05-19 ENCOUNTER — Telehealth: Payer: Self-pay | Admitting: Neurology

## 2018-05-19 LAB — URINE CULTURE: CULTURE: NO GROWTH

## 2018-05-19 NOTE — Telephone Encounter (Signed)
Pts daughter called requesting an appt in the near future due to a hospital visit, stating they aren't sure if the pt has been having mini strokes or if something else is going on. Once advised the next available appt will be August and can have a message sent back to the [employer] declined. Anthony Fisher requested a call from the RN to discuss.

## 2018-05-19 NOTE — Telephone Encounter (Signed)
Noted. I will evaluate him tomorrow. Thank you.

## 2018-05-19 NOTE — Telephone Encounter (Signed)
Pt can see stroke NP per Anthony Fisher.  Spoke with daughter Anthony Fisher (on [place]). She stated that "everybody thinks he needs an MRI." She stated he has not been right since the ED visit. They did a CT. Possible TIA she says. ED note available for review in chart. Advised pt that since he has seen Anthony Fisher in past for stroke we can have him f/u with stroke NP. Pt was scheduled for tomorrow 05/20/18 @ 10:30 arrival time 10:00. Also advised daughter if pt has any acute changes, call 911 and get to hospital. She verbalized appreciation and understanding.

## 2018-05-20 ENCOUNTER — Encounter: Payer: Self-pay | Admitting: Adult Health

## 2018-05-20 ENCOUNTER — Ambulatory Visit: Payer: Medicare Other | Admitting: Adult Health

## 2018-05-20 VITALS — BP 128/75 | HR 85 | Ht 71.0 in

## 2018-05-20 DIAGNOSIS — R413 Other amnesia: Secondary | ICD-10-CM | POA: Diagnosis not present

## 2018-05-20 DIAGNOSIS — I10 Essential (primary) hypertension: Secondary | ICD-10-CM | POA: Diagnosis not present

## 2018-05-20 DIAGNOSIS — I610 Nontraumatic intracerebral hemorrhage in hemisphere, subcortical: Secondary | ICD-10-CM

## 2018-05-20 DIAGNOSIS — R29818 Other symptoms and signs involving the nervous system: Secondary | ICD-10-CM | POA: Diagnosis not present

## 2018-05-20 MED ORDER — ASPIRIN EC 81 MG PO TBEC: 81.0000 mg | DELAYED_RELEASE_TABLET | Freq: Every day | ORAL | Status: AC

## 2018-05-20 NOTE — Patient Instructions (Signed)
Start aspirin 81 mg daily for secondary stroke prevention  We will check cholesterol levels today and consider starting on cholesterol medications if needed  Continue to follow up with PCP regarding cholesterol and blood pressure management   Placed order for home PT/ST  Schedule MRI head appointment  Continue to monitor blood pressure at home  Maintain strict control of hypertension with blood pressure goal below 130/90, diabetes with hemoglobin A1c goal below 6.5% and cholesterol with LDL cholesterol (bad cholesterol) goal below 70 mg/dL. I also advised the patient to eat a healthy diet with plenty of whole grains, cereals, fruits and vegetables, exercise regularly and maintain ideal body weight.  Followup in the future with me in 3 months or call earlier if needed        Thank you for coming to see [place] at [facility]. I hope we have been able to provide you high quality care today.  You may receive a patient satisfaction survey over the next few weeks. We would appreciate your feedback and comments so that we may continue to improve ourselves and the health of our patients.

## 2018-05-20 NOTE — Progress Notes (Signed)
[facility]    Provider:  Dr Jaynee Eagles Referring Provider: Lorene Dy, MD Primary Care Physician:  Lorene Dy, MD  CC:  Stroke follow up  HPI:  Anthony Fisher is a 84 y.o. male here as a referral from Dr. Mancel Bale for stroke follow up. Past medical history of dementia. Reviewed notes from [facility]. Patient presented on March 12 with a left-sided weakness, stumbling, family heard her fall and found him with left-sided weakness. The patient was initially seen by the stroke team it was reported that he had severe dysarthria and opened eyes on pain stimulation. CT of the head showed an intraparenchymal hematoma within the right thalamus extending into the right lateral ventricle consistent with a hypertensive hemorrhage (personally reviewed images and agree). MRI of the brain did not show amyloid angiopathy (personally reviewed images of the brain) He was lethargic and continued to have severe dysarthria, left facial droop, generalized weakness more on the left side  and did not pass a swallow. Patient had overnight delirium. 2-D echo showed a normal ejection fraction, LDL 70, hemoglobin A1c 5.5. He was on no antithrombotic prior to admission. Patient did not have a PEG prior to SNF, he is on pureed food. Blood pressure was 184/94 on arrival.  He is at home with is daughter. He only went to SNF for a week, they were not happy with the care and he is now at home with home PT, OT, and speech. Doing well, had some scrambled eggs and did well, requesting repeat speech and swallow. They have been checking his blood pressure at home. Before the stroke he was not on any anti-hypertensive medications. Daughter provides all information.   Personally reviewed MRI of the brain images which showed a right thalamic hemorrhage with intraventricular extension and an old lacunar infarct in the right caudate. There is advanced atrophy more so in the mesial temporal lobes consistent with his  diagnosis of dementia and white matter changes consistent with chronic small vessel ischemic disease.  05/20/18 UPDATE: Patient is being seen today by request of daughter after recent hospitalization.  On 05/17/2018 around 8:30 PM daughter was attempting to get patient to bed where he was found to be more lethargic, did not know who he was and what his name was called he did not respond.  He was also unable to follow commands.  They denied him losing consciousness or seizure-like activity, patient did not fall or hit his head.  Family brought him into the ED for an evaluation.  Patient was hypertensive upon arrival but he was also agitated.  Around 10:30 PM that evening, patient returned to his baseline.  Patient did have CT scan done which showed chronic ischemic microangiopathy without acute intercranial abnormality.  2D echo was performed previously on 02/16/17 which did show EF of 60 to 65%.  UA negative for UTI.  WBC was mildly elevated at 11.6.  Chest x-ray was obtained and was negative for pulmonary consolidation, CHF, effusion, pneumothorax or acute osseous abnormality.  Per ED note, upon assessment patient was found to have focal breath sounds in the left lower lobe and along with a mild leukocytosis was decided to start patient on Levaquin for possible aspiration pneumonia as he does have issues with swallowing.  Patient is afebrile and does not show signs of sepsis.  Patient was discharged home in stable condition.  Patient is being seen today as requested by daughter and is accompanied by daughter and granddaughter.  He currently lives with  granddaughter and her father where they provide majority of the care.  Since hospital discharge, patient has had one additional episode on Wednesday afternoon that lasted less than 1 hour where he was not responding and was not following commands.  Patient states that he does not remember there is happening but patient also has dementia.  Per daughter and  granddaughter patient does not lose consciousness during these episodes.  Denies any other type episode like this in the past.  They stated since Tuesdays episode he has not been the same as he has had personality changes such as increased talkativeness, worsening dementia, increase aphasia/dysarthria and granddaughter states the same thing that happened after previous stroke.  Denies worsening in swallowing but does state he has been drooling a lot more.  He was previously prescribed aspirin 81 mg but per daughter, PCP discontinued aspirin due to history of brain hemorrhage.  Patient is currently sitting in wheelchair and has been a difficult time ambulating.  Patient is falling asleep frequently throughout appointment.  Blood pressure satisfactory 120/75.     Review of Systems: Patient complains of symptoms per HPI as well as the following symptoms: Fatigue, trouble swallowing, drooling, cough, choking, incontinence of bowels, snoring, cold intolerance, and itching and all other systems negative   Social History   Socioeconomic History  . Marital status: Single    Spouse name: Not on file  . Number of children: 2  . Years of education: Not on file  . Highest education level: Not on file  Occupational History  . Not on file  Social Needs  . Financial resource strain: Not on file  . Food insecurity:    Worry: Not on file    Inability: Not on file  . Transportation needs:    Medical: Not on file    Non-medical: Not on file  Tobacco Use  . Smoking status: Never Smoker  . Smokeless tobacco: Never Used  Substance and Sexual Activity  . Alcohol use: No  . Drug use: No  . Sexual activity: Not on file  Lifestyle  . Physical activity:    Days per week: Not on file    Minutes per session: Not on file  . Stress: Not on file  Relationships  . Social connections:    Talks on phone: Not on file    Gets together: Not on file    Attends religious service: Not on file    Active member of  club or organization: Not on file    Attends meetings of clubs or organizations: Not on file    Relationship status: Not on file  . Intimate partner violence:    Fear of current or ex partner: Not on file    Emotionally abused: Not on file    Physically abused: Not on file    Forced sexual activity: Not on file  Other Topics Concern  . Not on file  Social History Narrative   Currently living w/ his daughter and family    Family History  Problem Relation Age of Onset  . Dementia Mother   . Heart attack Father     Past Medical History:  Diagnosis Date  . Adenomatous polyp of colon   . Dementia     Past Surgical History:  Procedure Laterality Date  . CATARACT EXTRACTION W/ INTRAOCULAR LENS  IMPLANT, BILATERAL      Current Outpatient Medications  Medication Sig Dispense Refill  . aspirin 81 MG chewable tablet Chew by mouth daily.    Marland Kitchen  levofloxacin (LEVAQUIN) 25 MG/ML solution Take 30 mLs (750 mg total) by mouth daily. 210 mL 0  . tamsulosin (FLOMAX) 0.4 MG CAPS capsule Take 1 capsule (0.4 mg total) by mouth daily after supper. 30 capsule    No current facility-administered medications for this visit.     Allergies as of 05/20/2018 - Review Complete 05/17/2018  Allergen Reaction Noted  . Tape Other (See Comments) 02/15/2017    Vitals: There were no vitals taken for this visit. Last Weight:  Wt Readings from Last 1 Encounters:  05/18/18 185 lb (83.9 kg)   Last Height:   Ht Readings from Last 1 Encounters:  05/18/18 5\' 11"  (1.803 m)   Physical exam: Exam: Gen: NAD                     CV: RRR, no MRG. No Carotid Bruits. No peripheral edema, warm, nontender Eyes: Conjunctivae clear without exudates or hemorrhage  Neuro: Detailed Neurologic Exam  Speech:    Speech is moderately dysarthric; with impaired comprehension.  Cognition:    The patient is oriented to person and disoriented to place and time    recent and remote memory impaired;     Impaired  attention, concentration,    fund of knowledge Cranial Nerves:    The pupils are equal, round, and reactive to light. Attempted funduscopic exam could not visualize due to patient noncooperation. Impaired upgaze otherwise extraocular movements are intact.  Left facial droop with drooling. The palate elevates in the midline. Hearing impaired. Voice is hypophonic. Shoulder shrug is normal. The tongue has normal motion without fasciculations.   Coordination:    No dysmetria  Gait:    Did not attempt to assess ambulation as patient is currently sitting in wheelchair and he has not been ambulating at home  Motor Observation:    no involuntary movements noted. Tone:    Normal muscle tone.    Posture:    Posture is slightly stooped and wheelchair    Strength:    Difficulty with complete strength exam due to patient's cognitive deficits and dementia however all his limbs are antigravity there does appear to be left sided weakness more in the left arm than leg.     Sensation: Difficult due to dementia but appears to be intact to light touch     Reflex Exam:  DTR's:    Deep tendon reflexes in the upper and lower extremities are symmetrical bilaterally.   Toes:    The toes are upgoing bilaterally.   Clonus:    Clonus is absent.    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 Resultant dysarthria, left hemiparesis.  Patient return to ED on 05/17/2018 with transient alteration of awareness.  CT negative and negative for acute infection and patient was discharged home in stable condition.    -Highly recommended to restart aspirin as patient has had multiple strokes in the past and it brain hemorrhage was over 1 year ago today.  Recommended restart aspirin 81 mg for secondary stroke prevention -Lipid panel drawn today to assess for stroke risk factors and possible  management -Order placed for MRI of brain to assess for possible acute stroke and if negative, can consider possible EEG to rule out seizure activity -Orders placed for B12 and TSH to look for reversible causes of decreased memory -F/u with PCP regarding your HTN management -continue to  monitor BP at home -Order placed for home PT/ST to help with generalized deconditioning, weakness and increase in dysarthria -Maintain strict control of hypertension with blood pressure goal below 130/90, diabetes with hemoglobin A1c goal below 6.5% and cholesterol with LDL cholesterol (bad cholesterol) goal below 70 mg/dL. I also advised the patient to eat a healthy diet with plenty of whole grains, cereals, fruits and vegetables, exercise regularly and maintain ideal body weight.  Follow up in 3 months or call earlier if needed  Greater than 50% of time during this 25 minute visit was spent on counseling,explanation of diagnosis of transient episodes, reviewing risk factor management of HTN and previous strokes, planning of further management, discussion with patient and family and coordination of care  Venancio Poisson, [facility]  [facility] [address] [place], [place] [address]  Phone [phone] Fax [phone]

## 2018-05-20 NOTE — Addendum Note (Signed)
Addended by: Venancio Poisson on: 05/20/2018 11:26 AM   Modules accepted: Orders

## 2018-05-21 LAB — LIPID PANEL
CHOL/HDL RATIO: 2.1 ratio (ref 0.0–5.0)
Cholesterol, Total: 145 mg/dL (ref 100–199)
HDL: 70 mg/dL (ref >39)
LDL CALC: 60 mg/dL (ref 0–99)
Triglycerides: 76 mg/dL (ref 0–149)
VLDL CHOLESTEROL CAL: 15 mg/dL (ref 5–40)

## 2018-05-21 LAB — VITAMIN B12: VITAMIN B 12: 479 pg/mL (ref 232–1245)

## 2018-05-21 LAB — TSH: TSH: 2.91 u[IU]/mL (ref 0.450–4.500)

## 2018-05-21 NOTE — Progress Notes (Signed)
Personally  participated in, made any corrections needed, and agree with history, physical, neuro exam,assessment and plan as stated above.    Sheryl Saintil, MD [facility] 

## 2018-05-23 ENCOUNTER — Telehealth: Payer: Self-pay | Admitting: Adult Health

## 2018-05-23 LAB — CULTURE, BLOOD (ROUTINE X 2)
Culture: NO GROWTH
Culture: NO GROWTH

## 2018-05-23 NOTE — Telephone Encounter (Signed)
[facility] Medicare order sent to GI. They wll reach out to the pt to schedule.

## 2018-05-25 ENCOUNTER — Ambulatory Visit
Admission: RE | Admit: 2018-05-25 | Discharge: 2018-05-25 | Disposition: A | Discharge status: Home / Self Care | Payer: Medicare Other | Source: Ambulatory Visit | Attending: Adult Health | Admitting: Adult Health

## 2018-05-25 DIAGNOSIS — R29818 Other symptoms and signs involving the nervous system: Secondary | ICD-10-CM | POA: Diagnosis not present

## 2018-05-26 ENCOUNTER — Telehealth: Payer: Self-pay | Admitting: Adult Health

## 2018-05-26 NOTE — Telephone Encounter (Signed)
Beth,RN/[facility] [phone] called to advise the daughter said he is a late riser and they were unable to see him today. She wanted to advise they will be seeing him tomorrow. FYI

## 2018-05-30 ENCOUNTER — Telehealth: Payer: Self-pay

## 2018-05-30 ENCOUNTER — Other Ambulatory Visit: Payer: Self-pay

## 2018-05-30 NOTE — Telephone Encounter (Signed)
Notes recorded by Marval Regal, RN on 05/30/2018 at 10:11 AM EDT Left vm for patients daughter Gregary Signs to call back about pts MRI brain results. ------

## 2018-05-30 NOTE — Telephone Encounter (Signed)
Notes recorded by Marval Regal, RN on 05/30/2018 at 10:11 AM EDT Left vm for [name] daughter on Dpr to call back about lab work. ------

## 2018-05-30 NOTE — Telephone Encounter (Signed)
Notes recorded by Marval Regal, RN on 05/30/2018 at 11:21 AM EDT RN receive call from patients daughter Gregary Signs. Rn stated MRI did show old stroke. It was good without signs of any new acute stroke. Gregary Signs verbalized understanding. ------

## 2018-05-30 NOTE — Telephone Encounter (Signed)
Sean/[facility] did PT start of care needs VO fo r1 x 1, 3 x 2, 2 x 4. Please call to advise [phone], can LVM

## 2018-05-30 NOTE — Telephone Encounter (Signed)
-----   Message from Anthony Poisson, NP sent at 05/27/2018  7:47 AM EDT ----- Please notify patient that his MRI looked good without signs of acute stroke.

## 2018-05-30 NOTE — Telephone Encounter (Signed)
Notes recorded by Marval Regal, RN on 05/30/2018 at 11:23 AM EDT RN receive call from daughter. Rn stated the labs look good without any reason to be on any statin medication.

## 2018-05-30 NOTE — Telephone Encounter (Signed)
Rn call [employer] with [employer]. Rn stated per Janett Billow NP gave verbal order of PT x1, 3x2, 2x4. Sean verbalized understanding.

## 2018-06-08 NOTE — Telephone Encounter (Signed)
Speech Therapist Darlina Rumpf [phone]  has called for verbal orders she is requesting a call back today.  1 week 1, 2 week 2 1 week 1 this is for speech therapy for swallowing and cognitive language difficulty.

## 2018-06-08 NOTE — Telephone Encounter (Signed)
Rn call Deidre back to give orders for speech therapy. Rn stated per Janett Billow Np pt can have therapy listed below. Deidre verbalized understanding.

## 2018-06-14 NOTE — Telephone Encounter (Signed)
Speech therapy orders signed by Dr. Jaynee Eagles and returned via fax to [facility]. Received a receipt of confirmation.

## 2018-06-24 NOTE — Telephone Encounter (Signed)
Speech order for added medication (pt treated w/ cipro for possible UTI per Dr. Lorene Dy) signed by Dr. Jaynee Eagles and faxed back to [facility]. Received a receipt of confirmation.

## 2018-06-29 NOTE — Telephone Encounter (Signed)
PT plan of care forms were signed by Dr. Jaynee Eagles and returned via fax to [facility]. Received a receipt of confirmation.

## 2018-07-13 ENCOUNTER — Encounter (HOSPITAL_COMMUNITY): Payer: Self-pay | Admitting: Emergency Medicine

## 2018-07-13 ENCOUNTER — Inpatient Hospital Stay (HOSPITAL_COMMUNITY)
Admission: EM | Admit: 2018-07-13 | Discharge: 2018-07-16 | DRG: 871 | Disposition: A | Discharge status: Home Health | Payer: Medicare Other | Attending: Family Medicine | Admitting: Family Medicine

## 2018-07-13 ENCOUNTER — Other Ambulatory Visit: Payer: Self-pay

## 2018-07-13 ENCOUNTER — Emergency Department (HOSPITAL_COMMUNITY): Payer: Medicare Other

## 2018-07-13 DIAGNOSIS — F0390 Unspecified dementia without behavioral disturbance: Secondary | ICD-10-CM | POA: Diagnosis not present

## 2018-07-13 DIAGNOSIS — Z818 Family history of other mental and behavioral disorders: Secondary | ICD-10-CM

## 2018-07-13 DIAGNOSIS — I10 Essential (primary) hypertension: Secondary | ICD-10-CM | POA: Diagnosis present

## 2018-07-13 DIAGNOSIS — R6520 Severe sepsis without septic shock: Secondary | ICD-10-CM | POA: Diagnosis present

## 2018-07-13 DIAGNOSIS — R1310 Dysphagia, unspecified: Secondary | ICD-10-CM | POA: Diagnosis present

## 2018-07-13 DIAGNOSIS — Z9181 History of falling: Secondary | ICD-10-CM

## 2018-07-13 DIAGNOSIS — I5032 Chronic diastolic (congestive) heart failure: Secondary | ICD-10-CM | POA: Diagnosis present

## 2018-07-13 DIAGNOSIS — I452 Bifascicular block: Secondary | ICD-10-CM | POA: Diagnosis present

## 2018-07-13 DIAGNOSIS — N390 Urinary tract infection, site not specified: Secondary | ICD-10-CM | POA: Diagnosis not present

## 2018-07-13 DIAGNOSIS — Z86010 Personal history of colonic polyps: Secondary | ICD-10-CM

## 2018-07-13 DIAGNOSIS — Z1612 Extended spectrum beta lactamase (ESBL) resistance: Secondary | ICD-10-CM | POA: Diagnosis not present

## 2018-07-13 DIAGNOSIS — Z9842 Cataract extraction status, left eye: Secondary | ICD-10-CM | POA: Diagnosis not present

## 2018-07-13 DIAGNOSIS — Z66 Do not resuscitate: Secondary | ICD-10-CM | POA: Diagnosis present

## 2018-07-13 DIAGNOSIS — A419 Sepsis, unspecified organism: Secondary | ICD-10-CM | POA: Diagnosis present

## 2018-07-13 DIAGNOSIS — Z9841 Cataract extraction status, right eye: Secondary | ICD-10-CM | POA: Diagnosis not present

## 2018-07-13 DIAGNOSIS — J690 Pneumonitis due to inhalation of food and vomit: Secondary | ICD-10-CM | POA: Diagnosis not present

## 2018-07-13 DIAGNOSIS — I110 Hypertensive heart disease with heart failure: Secondary | ICD-10-CM | POA: Diagnosis present

## 2018-07-13 DIAGNOSIS — Z961 Presence of intraocular lens: Secondary | ICD-10-CM | POA: Diagnosis not present

## 2018-07-13 DIAGNOSIS — Z7982 Long term (current) use of aspirin: Secondary | ICD-10-CM | POA: Diagnosis not present

## 2018-07-13 DIAGNOSIS — Z8249 Family history of ischemic heart disease and other diseases of the circulatory system: Secondary | ICD-10-CM | POA: Diagnosis not present

## 2018-07-13 DIAGNOSIS — Z87440 Personal history of urinary (tract) infections: Secondary | ICD-10-CM | POA: Diagnosis not present

## 2018-07-13 DIAGNOSIS — G9341 Metabolic encephalopathy: Secondary | ICD-10-CM | POA: Diagnosis not present

## 2018-07-13 DIAGNOSIS — Z91048 Other nonmedicinal substance allergy status: Secondary | ICD-10-CM | POA: Diagnosis not present

## 2018-07-13 DIAGNOSIS — I69154 Hemiplegia and hemiparesis following nontraumatic intracerebral hemorrhage affecting left non-dominant side: Secondary | ICD-10-CM | POA: Diagnosis not present

## 2018-07-13 DIAGNOSIS — A4151 Sepsis due to Escherichia coli [E. coli]: Principal | ICD-10-CM | POA: Diagnosis present

## 2018-07-13 DIAGNOSIS — C44229 Squamous cell carcinoma of skin of left ear and external auricular canal: Secondary | ICD-10-CM | POA: Diagnosis not present

## 2018-07-13 DIAGNOSIS — R4182 Altered mental status, unspecified: Secondary | ICD-10-CM | POA: Diagnosis present

## 2018-07-13 LAB — CBC WITH DIFFERENTIAL/PLATELET
Abs Immature Granulocytes: 0.1 10*3/uL (ref 0.0–0.1)
Basophils Absolute: 0.1 10*3/uL (ref 0.0–0.1)
Basophils Relative: 0 %
EOS PCT: 0 %
Eosinophils Absolute: 0 10*3/uL (ref 0.0–0.7)
HEMATOCRIT: 35.1 % — AB (ref 39.0–52.0)
Hemoglobin: 11.9 g/dL — ABNORMAL LOW (ref 13.0–17.0)
Immature Granulocytes: 1 %
Lymphocytes Relative: 6 %
Lymphs Abs: 1.1 10*3/uL (ref 0.7–4.0)
MCH: 33.4 pg (ref 26.0–34.0)
MCHC: 33.9 g/dL (ref 30.0–36.0)
MCV: 98.6 fL (ref 78.0–100.0)
Monocytes Absolute: 1.3 10*3/uL — ABNORMAL HIGH (ref 0.1–1.0)
Monocytes Relative: 7 %
Neutro Abs: 15.2 10*3/uL — ABNORMAL HIGH (ref 1.7–7.7)
Neutrophils Relative %: 86 %
Platelets: 213 10*3/uL (ref 150–400)
RBC: 3.56 MIL/uL — AB (ref 4.22–5.81)
RDW: 13.2 % (ref 11.5–15.5)
WBC: 17.7 10*3/uL — AB (ref 4.0–10.5)

## 2018-07-13 LAB — COMPREHENSIVE METABOLIC PANEL
ALK PHOS: 60 U/L (ref 38–126)
ALT: 12 U/L (ref 0–44)
ANION GAP: 13 (ref 5–15)
AST: 25 U/L (ref 15–41)
Albumin: 3.2 g/dL — ABNORMAL LOW (ref 3.5–5.0)
BILIRUBIN TOTAL: 2 mg/dL — AB (ref 0.3–1.2)
BUN: 21 mg/dL (ref 8–23)
CALCIUM: 8.6 mg/dL — AB (ref 8.9–10.3)
CO2: 20 mmol/L — ABNORMAL LOW (ref 22–32)
Chloride: 103 mmol/L (ref 98–111)
Creatinine, Ser: 1.05 mg/dL (ref 0.61–1.24)
Glucose, Bld: 149 mg/dL — ABNORMAL HIGH (ref 70–99)
Potassium: 3.9 mmol/L (ref 3.5–5.1)
SODIUM: 136 mmol/L (ref 135–145)
Total Protein: 5.8 g/dL — ABNORMAL LOW (ref 6.5–8.1)

## 2018-07-13 LAB — URINALYSIS, ROUTINE W REFLEX MICROSCOPIC
Bilirubin Urine: NEGATIVE
GLUCOSE, UA: NEGATIVE mg/dL
Ketones, ur: NEGATIVE mg/dL
Nitrite: NEGATIVE
PROTEIN: NEGATIVE mg/dL
Specific Gravity, Urine: 1.02 (ref 1.005–1.030)
pH: 5 (ref 5.0–8.0)

## 2018-07-13 LAB — PROTIME-INR
INR: 1.21
PROTHROMBIN TIME: 15.2 s (ref 11.4–15.2)

## 2018-07-13 LAB — I-STAT CG4 LACTIC ACID, ED
LACTIC ACID, VENOUS: 2.11 mmol/L — AB (ref 0.5–1.9)
LACTIC ACID, VENOUS: 1.56 mmol/L (ref 0.5–1.9)

## 2018-07-13 LAB — BRAIN NATRIURETIC PEPTIDE: B NATRIURETIC PEPTIDE 5: 211.9 pg/mL — AB (ref 0.0–100.0)

## 2018-07-13 LAB — LACTIC ACID, PLASMA: Lactic Acid, Venous: 1.7 mmol/L (ref 0.5–1.9)

## 2018-07-13 LAB — PROCALCITONIN: Procalcitonin: 0.25 ng/mL

## 2018-07-13 MED ORDER — VANCOMYCIN HCL IN DEXTROSE 1-5 GM/200ML-% IV SOLN: 1000.0000 mg | Freq: Once | INTRAVENOUS | Status: DC

## 2018-07-13 MED ORDER — CEFEPIME HCL 1 G IJ SOLR: 1.0000 g | Freq: Two times a day (BID) | INTRAMUSCULAR | Status: DC

## 2018-07-13 MED ORDER — HYDROXYZINE HCL 50 MG/ML IM SOLN
25.0000 mg | Freq: Four times a day (QID) | INTRAMUSCULAR | Status: DC | PRN
Start: 2018-07-13 — End: 2018-07-16
  Filled 2018-07-13: qty 0.5

## 2018-07-13 MED ORDER — ACETAMINOPHEN 650 MG RE SUPP
650.0000 mg | Freq: Four times a day (QID) | RECTAL | Status: DC | PRN
  Administered 2018-07-14: 650 mg via RECTAL
  Filled 2018-07-13 (×2): qty 1

## 2018-07-13 MED ORDER — LACTATED RINGERS IV BOLUS (SEPSIS)
1000.0000 mL | Freq: Once | INTRAVENOUS | Status: AC
  Administered 2018-07-13: 1000 mL via INTRAVENOUS

## 2018-07-13 MED ORDER — PIPERACILLIN-TAZOBACTAM 3.375 G IVPB
3.3750 g | Freq: Three times a day (TID) | INTRAVENOUS | Status: DC
  Administered 2018-07-13 – 2018-07-14 (×3): 3.375 g via INTRAVENOUS
  Filled 2018-07-13 (×3): qty 50

## 2018-07-13 MED ORDER — SODIUM CHLORIDE 0.9 % IV SOLN
INTRAVENOUS | Status: DC
  Administered 2018-07-13 – 2018-07-16 (×6): via INTRAVENOUS

## 2018-07-13 MED ORDER — SODIUM CHLORIDE 0.9 % IV SOLN
2.0000 g | Freq: Once | INTRAVENOUS | Status: AC
  Administered 2018-07-13: 2 g via INTRAVENOUS
  Filled 2018-07-13: qty 2

## 2018-07-13 MED ORDER — ALBUTEROL SULFATE (2.5 MG/3ML) 0.083% IN NEBU: 2.5000 mg | INHALATION_SOLUTION | RESPIRATORY_TRACT | Status: DC | PRN

## 2018-07-13 MED ORDER — VANCOMYCIN HCL IN DEXTROSE 750-5 MG/150ML-% IV SOLN
750.0000 mg | Freq: Two times a day (BID) | INTRAVENOUS | Status: DC
  Administered 2018-07-14: 750 mg via INTRAVENOUS
  Filled 2018-07-13 (×2): qty 150

## 2018-07-13 MED ORDER — HYDRALAZINE HCL 20 MG/ML IJ SOLN: 5.0000 mg | INTRAMUSCULAR | Status: DC | PRN

## 2018-07-13 MED ORDER — VANCOMYCIN HCL 10 G IV SOLR
1500.0000 mg | Freq: Once | INTRAVENOUS | Status: AC
  Administered 2018-07-13: 1500 mg via INTRAVENOUS
  Filled 2018-07-13: qty 1500

## 2018-07-13 NOTE — ED Provider Notes (Signed)
[facility] EMERGENCY DEPARTMENT Provider Note   CSN: [id number] Arrival date & time: 07/13/18  1807     History   Chief Complaint Chief Complaint  Patient presents with  . Altered Mental Status  . Fever    HPI Anthony Fisher is a 84 y.o. male with severe dementia, CHF, and prior CVA who presents with his family via EMS due to being altered and having a fever.  His family reports that they found that he had a fever up to 100.8 this morning.  He said that he has not been eating or drinking well all day.  They say he has a cough that has worsened over the past 1 to 2 days.  He said that he was recently treated for UTI with ciprofloxacin.  He also said he has not had a bowel movement in 4 to 5 days.  He has had no episodes of emesis.  He is normally mildly verbal and able to ambulate with assistance.  HPI  Past Medical History:  Diagnosis Date  . Adenomatous polyp of colon   . Dementia     Patient Active Problem List   Diagnosis Date Noted  . Acute metabolic encephalopathy 84/69/6295  . Essential hypertension 07/13/2018  . Sepsis ([place]) 07/13/2018  . Chronic diastolic CHF (congestive heart failure) ([place]) 07/13/2018  . Aspiration pneumonia ([place]) 07/13/2018  . Bacterial UTI 04/27/2017  . CHF (congestive heart failure) ([place])   . Fall   . Fever   . Benign essential HTN   . Labile blood pressure   . Prerenal azotemia   . Hemiparesis of left dominant side due to nontraumatic intracerebral hemorrhage ([place]) 02/24/2017  . IVH (intraventricular hemorrhage) ([place]) 02/23/2017  . Hypertensive emergency 02/23/2017  . Dysphagia due to recent stroke 02/23/2017  . Urinary retention 02/23/2017  . Dementia 02/23/2017  . Congestive heart failure ([place]) 02/23/2017  . Hypokalemia 02/23/2017  . Thalamic hemorrhage ([place]) 02/23/2017  . ICH (intracerebral hemorrhage) ([place]) - R thalamic hemorrhage 02/16/2017    Past Surgical History:  Procedure Laterality Date  . CATARACT  EXTRACTION W/ INTRAOCULAR LENS  IMPLANT, BILATERAL          Home Medications    Prior to Admission medications   Medication Sig Start Date End Date Taking? Authorizing Provider  aspirin EC 81 MG tablet Take 1 tablet (81 mg total) by mouth daily. Patient not taking: Reported on 07/13/2018 05/20/18   Venancio Poisson, NP    Family History Family History  Problem Relation Age of Onset  . Dementia Mother   . Heart attack Father     Social History Social History   Tobacco Use  . Smoking status: Never Smoker  . Smokeless tobacco: Never Used  Substance Use Topics  . Alcohol use: No  . Drug use: No     Allergies   Tape   Review of Systems Review of Systems Review of Systems   Unable to obtain due to the patient having severe dementia and being altered      Physical Exam Updated Vital Signs BP (!) 164/78 (BP Location: Right Arm)   Pulse 79   Temp (!) 100.5 F (38.1 C) (Oral)   Resp 20   Ht 6\' 2"  (1.88 m)   Wt 83.9 kg (185 lb)   SpO2 100%   BMI 23.75 kg/m   Physical Exam Physical Exam Constitutional  Nursing notes reviewed  Vital signs reviewed  HEENT  No obvious trauma  Supple without meningismus, mass,  or overt JVD  EOMI  No scleral icterus or injection  Respiratory  Effort normal  CTAB  No respiratory distress  CV  Normal rate  No obvious murmurs  No pitting edema  Equal pulses in all extremities  Abdomen  Soft  Non-tender  Non-distended  No peritonitis  MSK  Atraumatic  No obvious deformity  ROM appropriate  Skin  Warm  Dry  Neuro  Awake, alert, oriented x0  Currently non-verbal  Moving all extremities  Psychiatric  Unable to assess        ED Treatments / Results  Labs (all labs ordered are listed, but only abnormal results are displayed) Labs Reviewed  COMPREHENSIVE METABOLIC PANEL - Abnormal; Notable for the following components:      Result Value   CO2 20 (*)    Glucose, Bld 149 (*)    Calcium  8.6 (*)    Total Protein 5.8 (*)    Albumin 3.2 (*)    Total Bilirubin 2.0 (*)    All other components within normal limits  CBC WITH DIFFERENTIAL/PLATELET - Abnormal; Notable for the following components:   WBC 17.7 (*)    RBC 3.56 (*)    Hemoglobin 11.9 (*)    HCT 35.1 (*)    Neutro Abs 15.2 (*)    Monocytes Absolute 1.3 (*)    All other components within normal limits  URINALYSIS, ROUTINE W REFLEX MICROSCOPIC - Abnormal; Notable for the following components:   Color, Urine AMBER (*)    APPearance HAZY (*)    Hgb urine dipstick LARGE (*)    Leukocytes, UA TRACE (*)    Bacteria, UA RARE (*)    All other components within normal limits  BRAIN NATRIURETIC PEPTIDE - Abnormal; Notable for the following components:   B Natriuretic Peptide 211.9 (*)    All other components within normal limits  I-STAT CG4 LACTIC ACID, ED - Abnormal; Notable for the following components:   Lactic Acid, Venous 2.11 (*)    All other components within normal limits  CULTURE, BLOOD (ROUTINE X 2)  CULTURE, BLOOD (ROUTINE X 2)  GRAM STAIN  URINE CULTURE  RESPIRATORY PANEL BY PCR  EXPECTORATED SPUTUM ASSESSMENT W REFEX TO RESP CULTURE  PROTIME-INR  LACTIC ACID, PLASMA  PROCALCITONIN  STREP PNEUMONIAE URINARY ANTIGEN  I-STAT CG4 LACTIC ACID, ED    EKG EKG Interpretation  Date/Time:  Wednesday July 13 2018 19:05:28 EDT Ventricular Rate:  88 PR Interval:    QRS Duration: 160 QT Interval:  413 QTC Calculation: 500 R Axis:   -80 Text Interpretation:  Atrial fibrillation RBBB and LAFB Lateral infarct, acute No acute changes Nonspecific ST and T wave abnormality No significant change since last tracing Confirmed by Varney Biles ([id number]) on 07/13/2018 7:09:42 PM   Radiology Dg Chest 2 View  Result Date: 07/13/2018 CLINICAL DATA:  Weakness. EXAM: CHEST - 2 VIEW COMPARISON:  05/17/2018. FINDINGS: The cardiac silhouette remains borderline enlarged. The aorta remains tortuous and possibly diffusely  enlarged. The interstitial markings remain mildly prominent in the lungs are clear. Normal vascularity. Diffuse osteopenia. IMPRESSION: 1. No acute abnormality. 2. Stable borderline cardiomegaly, tortuous and possibly diffusely enlarged thoracic aorta and mild chronic interstitial lung disease. Electronically Signed   By: Claudie Revering M.D.   On: 07/13/2018 19:51    Procedures Procedures (including critical care time)  Medications Ordered in ED Medications  vancomycin (VANCOCIN) IVPB 750 mg/150 ml premix (has no administration in time range)  0.9 %  sodium chloride  infusion ( Intravenous New Bag/Given 07/13/18 2304)  albuterol (PROVENTIL) (2.5 MG/3ML) 0.083% nebulizer solution 2.5 mg (has no administration in time range)  piperacillin-tazobactam (ZOSYN) IVPB 3.375 g (3.375 g Intravenous New Bag/Given 07/13/18 2308)  acetaminophen (TYLENOL) suppository 650 mg (has no administration in time range)  hydrALAZINE (APRESOLINE) injection 5 mg (has no administration in time range)  hydrOXYzine (VISTARIL) injection 25 mg (has no administration in time range)  lactated ringers bolus 1,000 mL (0 mLs Intravenous Stopped 07/13/18 2043)    And  lactated ringers bolus 1,000 mL (0 mLs Intravenous Stopped 07/13/18 2100)    And  lactated ringers bolus 1,000 mL (0 mLs Intravenous Stopped 07/13/18 2237)  ceFEPIme (MAXIPIME) 2 g in sodium chloride 0.9 % 100 mL IVPB (0 g Intravenous Stopped 07/13/18 2028)  vancomycin (VANCOCIN) 1,500 mg in sodium chloride 0.9 % 500 mL IVPB (0 mg Intravenous Stopped 07/13/18 2246)     Initial Impression / Assessment and Plan / ED Course  I have reviewed the triage vital signs and the nursing notes.  Pertinent labs & imaging results that were available during my care of the patient were reviewed by me and considered in my medical decision making (see chart for details).     Anthony Fisher presents with fever and altered mental status as per above.  My immediate concern is for a  life-threatening infection in this patient. Code Sepsis was initiated at 11:48 PM.  The most likely infectious source is either respiratory or genitourinary.  I have a low suspicion for Meningitis and Endocarditis.  Thorough skin exam revealed no significant open wounds.  The patient was given 30 cc/kg of crystalloid. He was given cefepime due to my concern for a UTI.  However, his urine was not consistent with UTI.  Therefore, vancomycin was added.  Lactic acid elevated to 2.11.  WBC elevated to 17.7.  Blood cultures, urine cultures, and serial lactic acids are ordered.  Chest x-ray showed no evidence of pneumonia.  11:48 PM The antibiotics may be started before all cultures obtained.  Based off the presentation and lab findings, the patient meets criteria for severe sepsis.  Anthony Fisher was admitted to the hospital for ongoing treatment and monitoring.  The care of this patient was supervised by Dr. Kathrynn Humble, who agreed with the plan and management of the patient.   Final Clinical Impressions(s) / ED Diagnoses   Final diagnoses:  Severe sepsis [facility])    ED Discharge Orders    None       Alford Highland, MD 07/13/18 [id number]    Varney Biles, MD 07/17/18 [place], [place], MD 08/13/18 [phone]

## 2018-07-13 NOTE — Progress Notes (Addendum)
Pharmacy Antibiotic Note  Anthony Fisher is a 84 y.o. male admitted on 07/13/2018 with sepsis and UTI.  Pharmacy has been consulted for cefepime dosing. Tmax is 101.6 and WBC is elevated at 17.7. SCr is WNL and lactic acid is >2.   Plan: Cefepime 2gm IV x 1 then 1gm IV Q12h F/u renal fxn, C&S, clinical status and LOT De-escalate as able  Addendum: Adding vancomycin and changing cefepime to zosyn. Will give vancomycin 1500mg  IV x 1 then 750mg  IV Q12H and start zosyn 3.375gm IV Q8H (4 hr inf)  Height: 6\' 2"  (188 cm) Weight: 185 lb (83.9 kg) IBW/kg (Calculated) : 82.2  Temp (24hrs), Avg:101.6 F (38.7 C), Min:101.6 F (38.7 C), Max:101.6 F (38.7 C)  Recent Labs  Lab 07/13/18 1830 07/13/18 1833  WBC 17.7*  --   CREATININE 1.05  --   LATICACIDVEN  --  2.11*    Estimated Creatinine Clearance: 54.4 mL/min (by C-G formula based on SCr of 1.05 mg/dL).    Allergies  Allergen Reactions  . Tape Other (See Comments)    SKIN IS THIN (MAY TEAR AND/OR BRUISE EASILY)    Antimicrobials this admission: Cefepime 8/7>>  Dose adjustments this admission: N/A  Microbiology results: Pending  Thank you for allowing pharmacy to be a part of this patient's care.  Racheal Mathurin, Rande Lawman 07/13/2018 7:59 PM

## 2018-07-13 NOTE — ED Notes (Signed)
Niu, MD at bedside. °

## 2018-07-13 NOTE — H&P (Addendum)
History and Physical    Anthony Fisher DOB: January 12, 1926 DOA: 07/13/2018  Referring MD/NP/PA:   PCP: Lorene Dy, MD   Patient coming from:  The patient is coming from home.  At baseline, pt is partially dependent for most of ADL.  Chief Complaint: AMS, fever and cough  HPI: Anthony Fisher is a 84 y.o. male with medical history significant of ICH with left-sided weakness, hypertension, dCHF, dementia, who presents with altered mental status, fever and cough.  Per family, at baseline patient is able to recognize and interact with family, oriented to place time to time, but not to oriented to time. Since this AM, pt becomes more confused, looks very tired, not able to recognize families, not interactive with family as normally he does.  He has mild dry cough, but does not seem to have chest pain or respiratory distress per his daughter.  No active nausea, vomiting, diarrhea notated.  Patient  has been constipated recently.  He has left-sided weakness from previous stroke, which has not changed.  Not sure if patient has any symptoms of UTI, but no increased urinary frequency.  ED Course: pt was found to have WBC 17.3, lactic acid of 1.56, urinalysis has trace amount of leukocyte, but rare bacteria and 0-5 WBC.  INR 1.21, electrolytes renal function okay, temperature 101.6, no tachycardia, has tachypnea, oxygen saturation 96% on room air.  Patient is admitted to telemetry bed as inpatient.  Chest x-ray showed: 1. No acute abnormality. 2. Stable borderline cardiomegaly, tortuous and possibly diffusely enlarged thoracic aorta and mild chronic interstitial lung disease.  Review of Systems: Could not be reviewed due to dementia and altered mental status.  Allergy:  Allergies  Allergen Reactions  . Tape Other (See Comments)    SKIN IS THIN (MAY TEAR AND/OR BRUISE EASILY)    Past Medical History:  Diagnosis Date  . Adenomatous polyp of colon   . Dementia     Past  Surgical History:  Procedure Laterality Date  . CATARACT EXTRACTION W/ INTRAOCULAR LENS  IMPLANT, BILATERAL      Social History:  reports that he has never smoked. He has never used smokeless tobacco. He reports that he does not drink alcohol or use drugs.  Family History:  Family History  Problem Relation Age of Onset  . Dementia Mother   . Heart attack Father      Prior to Admission medications   Medication Sig Start Date End Date Taking? Authorizing Provider  aspirin EC 81 MG tablet Take 1 tablet (81 mg total) by mouth daily. Patient not taking: Reported on 07/13/2018 05/20/18   Venancio Poisson, NP    Physical Exam: Vitals:   07/13/18 2030 07/13/18 2045 07/13/18 2100 07/13/18 2115  BP: (!) 136/56  131/70   Pulse: 80 78 81 83  Resp: 20 (!) 22 18 18   Temp:      TempSrc:      SpO2: 97% 99% 98% 97%  Weight:      Height:       General: Not in acute distress HEENT:       Eyes: PERRL, EOMI, no scleral icterus.       ENT: No discharge from the ears and nose, no pharynx injection, no tonsillar enlargement.        Neck: No JVD, no bruit, no mass felt. Heme: No neck lymph node enlargement. Cardiac: S1/S2, RRR, No murmurs, No gallops or rubs. Respiratory:  No rales, wheezing, rhonchi or rubs. GI: Soft, nondistended,  nontender, no organomegaly, BS present. GU: No hematuria Ext: No pitting leg edema bilaterally. 2+DP/PT pulse bilaterally. Musculoskeletal: No joint deformities, No joint redness or warmth, no limitation of ROM in spin. Skin: No rashes.  Neuro: confused, knows his own name, but not oriented X3, cranial nerves II-XII grossly intact, has left sided weakness. Neck is supple. Psych: Patient is not psychotic, no suicidal or hemocidal ideation.  Labs on Admission: I have personally reviewed following labs and imaging studies  CBC: Recent Labs  Lab 07/13/18 1830  WBC 17.7*  NEUTROABS 15.2*  HGB 11.9*  HCT 35.1*  MCV 98.6  PLT 579   Basic Metabolic  Panel: Recent Labs  Lab 07/13/18 1830  NA 136  K 3.9  CL 103  CO2 20*  GLUCOSE 149*  BUN 21  CREATININE 1.05  CALCIUM 8.6*   GFR: Estimated Creatinine Clearance: 54.4 mL/min (by C-G formula based on SCr of 1.05 mg/dL). Liver Function Tests: Recent Labs  Lab 07/13/18 1830  AST 25  ALT 12  ALKPHOS 60  BILITOT 2.0*  PROT 5.8*  ALBUMIN 3.2*   No results for input(s): LIPASE, AMYLASE in the last 168 hours. No results for input(s): AMMONIA in the last 168 hours. Coagulation Profile: Recent Labs  Lab 07/13/18 1830  INR 1.21   Cardiac Enzymes: No results for input(s): CKTOTAL, CKMB, CKMBINDEX, TROPONINI in the last 168 hours. BNP (last 3 results) No results for input(s): PROBNP in the last 8760 hours. HbA1C: No results for input(s): HGBA1C in the last 72 hours. CBG: No results for input(s): GLUCAP in the last 168 hours. Lipid Profile: No results for input(s): CHOL, HDL, LDLCALC, TRIG, CHOLHDL, LDLDIRECT in the last 72 hours. Thyroid Function Tests: No results for input(s): TSH, T4TOTAL, FREET4, T3FREE, THYROIDAB in the last 72 hours. Anemia Panel: No results for input(s): VITAMINB12, FOLATE, FERRITIN, TIBC, IRON, RETICCTPCT in the last 72 hours. Urine analysis:    Component Value Date/Time   COLORURINE AMBER (A) 07/13/2018 1903   APPEARANCEUR HAZY (A) 07/13/2018 1903   LABSPEC 1.020 07/13/2018 1903   PHURINE 5.0 07/13/2018 1903   GLUCOSEU NEGATIVE 07/13/2018 1903   HGBUR LARGE (A) 07/13/2018 1903   BILIRUBINUR NEGATIVE 07/13/2018 1903   KETONESUR NEGATIVE 07/13/2018 1903   PROTEINUR NEGATIVE 07/13/2018 1903   UROBILINOGEN 0.2 05/09/2009 1004   NITRITE NEGATIVE 07/13/2018 1903   LEUKOCYTESUR TRACE (A) 07/13/2018 1903   Sepsis Labs: @LABRCNTIP (procalcitonin:4,lacticidven:4) )No results found for this or any previous visit (from the past 240 hour(s)).   Radiological Exams on Admission: Dg Chest 2 View  Result Date: 07/13/2018 CLINICAL DATA:  Weakness. EXAM:  CHEST - 2 VIEW COMPARISON:  05/17/2018. FINDINGS: The cardiac silhouette remains borderline enlarged. The aorta remains tortuous and possibly diffusely enlarged. The interstitial markings remain mildly prominent in the lungs are clear. Normal vascularity. Diffuse osteopenia. IMPRESSION: 1. No acute abnormality. 2. Stable borderline cardiomegaly, tortuous and possibly diffusely enlarged thoracic aorta and mild chronic interstitial lung disease. Electronically Signed   By: Claudie Revering M.D.   On: 07/13/2018 19:51     EKG: Independently reviewed.  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  Lactic acid is normal.  Hemodynamically stable.  The source of infection is not clear.  Urinalysis not impressive.  Neck supple, low suspicions for meningitis. Chest x-ray did not show infiltration, but clinically patient may have aspiration pneumonia given cough, AMS and hx of ICH stroke.  - will admit to tele bed as inpt - IV Vancomycin and Zosyn (pt received 1 dose of cefepime by ED) - prn Albuterol Nebs for SOB - Urine S. pneumococcal antigen - Follow up blood culture x2, sputum culture and respiratory virus panel - will get Procalcitonin and trend lactic acid level per sepsis protocol - IVF: 3.0 RL in ED, followed by 100 mL per hour of NS - SLP - keep pt NPO  ICH (intracerebral hemorrhage) ([place]) - R thalamic hemorrhage: has left sided weakness, which has not change. - No acute issues  Dementia: No behavior change -observe  Essential hypertension: Not taking medication at home.  Blood pressure  136/56. - IV hydralazine as needed  Chronic diastolic CHF (congestive heart failure) ([place]): 2D echo on 02/16/2017 showed EF 60 to 65% with grade 1 diastolic dysfunction.  Patient does not have leg edema or JVD.  CHF is compensated. - Check  BNP  Acute metabolic encephalopathy: Likely due to sepsis. -Frequent neuro check    DVT ppx: SCD Code Status: DNR (I discussed with patient's daughter and son who are POA, and explained the meaning of CODE STATUS. Per POA, patient would want to be DNR) Family Communication: Yes, patient's son and daughter at bed side Disposition Plan:  Anticipate discharge back to previous home environment Consults called:  none Admission status: Inpatient/tele      Date of Service 07/13/2018    Ivor Costa [facility] Pager [phone]  If 7PM-7AM, please contact night-coverage [email or web address] Password [email or web address] 07/13/2018, 9:35 PM

## 2018-07-13 NOTE — ED Triage Notes (Signed)
Patient from home, arrived via EMS, family stated he had AMS since this AM. Reported temperature this afternoon was 101, family gave 500mg  of tylenol around 1630, family also mentioned urine has been malodorous. Hx of UTI, dementia baseline oriented to self and family, hx of stroke with right sided deficits.

## 2018-07-13 NOTE — ED Notes (Signed)
Patient transported to X-ray 

## 2018-07-14 DIAGNOSIS — A419 Sepsis, unspecified organism: Secondary | ICD-10-CM

## 2018-07-14 DIAGNOSIS — J690 Pneumonitis due to inhalation of food and vomit: Secondary | ICD-10-CM

## 2018-07-14 DIAGNOSIS — G9341 Metabolic encephalopathy: Secondary | ICD-10-CM | POA: Diagnosis not present

## 2018-07-14 LAB — RESPIRATORY PANEL BY PCR
Adenovirus: NOT DETECTED
BORDETELLA PERTUSSIS-RVPCR: NOT DETECTED
CORONAVIRUS OC43-RVPPCR: NOT DETECTED
Chlamydophila pneumoniae: NOT DETECTED
Coronavirus 229E: NOT DETECTED
Coronavirus HKU1: NOT DETECTED
Coronavirus NL63: NOT DETECTED
INFLUENZA A-RVPPCR: NOT DETECTED
INFLUENZA B-RVPPCR: NOT DETECTED
METAPNEUMOVIRUS-RVPPCR: NOT DETECTED
MYCOPLASMA PNEUMONIAE-RVPPCR: NOT DETECTED
PARAINFLUENZA VIRUS 1-RVPPCR: NOT DETECTED
PARAINFLUENZA VIRUS 4-RVPPCR: NOT DETECTED
Parainfluenza Virus 2: NOT DETECTED
Parainfluenza Virus 3: NOT DETECTED
RESPIRATORY SYNCYTIAL VIRUS-RVPPCR: NOT DETECTED
Rhinovirus / Enterovirus: NOT DETECTED

## 2018-07-14 LAB — STREP PNEUMONIAE URINARY ANTIGEN: STREP PNEUMO URINARY ANTIGEN: NEGATIVE

## 2018-07-14 MED ORDER — SODIUM CHLORIDE 0.9 % IV SOLN
3.0000 g | Freq: Three times a day (TID) | INTRAVENOUS | Status: DC
  Administered 2018-07-14 – 2018-07-16 (×6): 3 g via INTRAVENOUS
  Filled 2018-07-14 (×8): qty 3

## 2018-07-14 NOTE — Progress Notes (Signed)
SLP Cancellation Note  Patient Details Name: Anthony Fisher MRN: [id number] DOB: 08-Mar-1926   Cancelled treatment:       Reason Eval/Treat Not Completed: Patient's level of consciousness. Pts son reports gradual decline. He coughs on solids often, feels he may need pureed foods at this point. Will plan to f/u tomorrow.   [name], [place] CCC-SLP [phone]  [name] 07/14/2018, 11:58 AM

## 2018-07-14 NOTE — Progress Notes (Signed)
ANTIBIOTIC CONSULT NOTE - INITIAL  Pharmacy Consult for Unasyn Indication: Aspiration PNA  Allergies  Allergen Reactions  . Tape Other (See Comments)    SKIN IS THIN (MAY TEAR AND/OR BRUISE EASILY)    Patient Measurements: Height: 6\' 2"  (188 cm) Weight: 185 lb (83.9 kg) IBW/kg (Calculated) : 82.2   Vital Signs: Temp: 99 F (37.2 C) (08/08 1207) Temp Source: Axillary (08/08 1207) BP: 116/55 (08/08 1207) Pulse Rate: 72 (08/08 1207) Intake/Output from previous day: 08/07 0701 - 08/08 0700 In: 7650 [I.V.:3800; IV Piggyback:3850] Out: -  Intake/Output from this shift: Total I/O In: 1236.4 [I.V.:1036.4; IV Piggyback:200] Out: -   Labs: Recent Labs    07/13/18 1830  WBC 17.7*  HGB 11.9*  PLT 213  CREATININE 1.05   Estimated Creatinine Clearance: 54.4 mL/min (by C-G formula based on SCr of 1.05 mg/dL). No results for input(s): VANCOTROUGH, VANCOPEAK, VANCORANDOM, GENTTROUGH, GENTPEAK, GENTRANDOM, TOBRATROUGH, TOBRAPEAK, TOBRARND, AMIKACINPEAK, AMIKACINTROU, AMIKACIN in the last 72 hours.   Microbiology: Recent Results (from the past 720 hour(s))  Culture, blood (Routine x 2)     Status: None (Preliminary result)   Collection Time: 07/13/18  6:43 PM  Result Value Ref Range Status   Specimen Description BLOOD LEFT ANTECUBITAL  Final   Special Requests   Final    BOTTLES DRAWN AEROBIC AND ANAEROBIC Blood Culture adequate volume   Culture   Final    NO GROWTH < 24 HOURS Performed at [facility], [address], [place], [place] [address]    Report Status PENDING  Incomplete  Culture, blood (Routine x 2)     Status: None (Preliminary result)   Collection Time: 07/13/18  6:45 PM  Result Value Ref Range Status   Specimen Description BLOOD LEFT ANTECUBITAL  Final   Special Requests   Final    BOTTLES DRAWN AEROBIC AND ANAEROBIC Blood Culture results may not be optimal due to an excessive volume of blood received in culture bottles   Culture   Final    NO GROWTH  < 24 HOURS Performed at [facility], [place] [address] [place], [place] [address]    Report Status PENDING  Incomplete  Respiratory Panel by PCR     Status: None   Collection Time: 07/13/18  9:40 PM  Result Value Ref Range Status   Adenovirus NOT DETECTED NOT DETECTED Final   Coronavirus 229E NOT DETECTED NOT DETECTED Final   Coronavirus HKU1 NOT DETECTED NOT DETECTED Final   Coronavirus NL63 NOT DETECTED NOT DETECTED Final   Coronavirus OC43 NOT DETECTED NOT DETECTED Final   Metapneumovirus NOT DETECTED NOT DETECTED Final   Rhinovirus / Enterovirus NOT DETECTED NOT DETECTED Final   Influenza A NOT DETECTED NOT DETECTED Final   Influenza B NOT DETECTED NOT DETECTED Final   Parainfluenza Virus 1 NOT DETECTED NOT DETECTED Final   Parainfluenza Virus 2 NOT DETECTED NOT DETECTED Final   Parainfluenza Virus 3 NOT DETECTED NOT DETECTED Final   Parainfluenza Virus 4 NOT DETECTED NOT DETECTED Final   Respiratory Syncytial Virus NOT DETECTED NOT DETECTED Final   Bordetella pertussis NOT DETECTED NOT DETECTED Final   Chlamydophila pneumoniae NOT DETECTED NOT DETECTED Final   Mycoplasma pneumoniae NOT DETECTED NOT DETECTED Final    Comment: Performed at [facility], [place] [address], [place], [place] [address]    Medical History: Past Medical History:  Diagnosis Date  . Adenomatous polyp of colon   . Dementia  Assessment: 84 y.o. male with medical history significant of ICH with left-sided weakness, hypertension, dCHF, dementia, who presents with altered mental status, fever and cough. Pharmacy consulted to dose Unasyn for aspiration PNA.   Plan:  Unasyn 3gm IV q8h Monitor for clinical course, LOT, renal function and fever curve.  Nazaret Chea A. Levada Dy, PharmD, [place] Pager: [phone] Please utilize Amion for appropriate phone number to reach the unit pharmacist ([place])   07/14/2018,3:19 PM

## 2018-07-14 NOTE — Progress Notes (Signed)
Report given to oncoming RN, Claiborne Billings resuming care.

## 2018-07-14 NOTE — Progress Notes (Signed)
Patient arrived in floor in a bed at 2330 pm and alert , confused, and drowsy, temp is high 100.3, will recheck again, pt's daughter is in bedside, pt is resting in a bed, no s/s of distress, bed is on lowest position, call bell is within reach, and will continue to monitor closely.

## 2018-07-14 NOTE — Progress Notes (Signed)
  PROGRESS NOTE  DEKLAN MINAR HGD:[id number] DOB: 1926-06-05 DOA: 07/13/2018 PCP: Lorene Dy, MD  Brief Narrative: 85 year old man PMH intracerebral hemorrhage with residual left-sided weakness, diastolic CHF, dementia who presented with altered mental status, fever, cough.  Admitted for suspected sepsis of unclear source.  Assessment/Plan Sepsis, source unclear.  Urinalysis unimpressive.  Chest x-ray no apparent disease but given cough, acute encephalopathy, PMH stroke, concern for aspiration given history of coughing on solids often.  Respiratory panel negative. --Appears to be clinically improving.  Hemodynamic stable.  Will adjust antibiotics to decrease risk of nephrotoxicity.  Continue to cover anaerobes. --Follow-up culture data  Acute metabolic encephalopathy secondary to presumed sepsis --Appears to be improving with empiric antibiotics.  Son at bedside reports similar clinical course and past.  Dementia  --appears stable  Chronic diastolic CHF --Appears compensated.  PMH stroke    DVT prophylaxis: SCDs Code Status: DNR  Family Communication: son at bedside Disposition Plan: home    [name], MD  [facility] Direct contact: [phone] --Via [place]  --[email or web address]; password [email or web address]  7PM-7AM contact night coverage as above 07/14/2018, 5:36 PM  LOS: 1 day   Consultants:    Procedures:    Antimicrobials:  Unasyn 8/8 >>  Zosyn  Vancomycin  Interval history/Subjective: Feels ok, denies complaints. Mostly sleeping per son.  Objective: Vitals:  Vitals:   07/14/18 1207 07/14/18 1654  BP: (!) 116/55 118/70  Pulse: 72 78  Resp: 18 18  Temp: 99 F (37.2 C) 98.3 F (36.8 C)  SpO2: 97% 97%    Exam:  Constitutional:  . Appears calm and comfortable; awakens to voice and answers simple questions Eyes:  . pupils appear normal though exam is limited ENMT:  . grossly normal hearing  . Lips appear normal Respiratory:  . CTA  bilaterally, no w/r/r.  . Respiratory effort normal.  Cardiovascular:  . RRR, no m/r/g . No LE extremity edema   Abdomen:  . Soft, ntnd Psychiatric:  . Mental status o Mood, affect appropriate  I have personally reviewed the following:   Labs:  No labs today.  Lactic acid returned to normal.  Imaging studies:  Chest x-ray no acute disease  Medical tests:  EKG sinus rhythm, right bundle branch block, old compared to previous study 05/17/2018   Scheduled Meds: Continuous Infusions: . sodium chloride 100 mL/hr at 07/14/18 0958  . 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

## 2018-07-15 DIAGNOSIS — G9341 Metabolic encephalopathy: Secondary | ICD-10-CM | POA: Diagnosis not present

## 2018-07-15 DIAGNOSIS — R4182 Altered mental status, unspecified: Secondary | ICD-10-CM | POA: Diagnosis not present

## 2018-07-15 DIAGNOSIS — N390 Urinary tract infection, site not specified: Secondary | ICD-10-CM | POA: Diagnosis not present

## 2018-07-15 DIAGNOSIS — J690 Pneumonitis due to inhalation of food and vomit: Secondary | ICD-10-CM | POA: Diagnosis not present

## 2018-07-15 DIAGNOSIS — A4151 Sepsis due to Escherichia coli [E. coli]: Secondary | ICD-10-CM | POA: Diagnosis not present

## 2018-07-15 DIAGNOSIS — A419 Sepsis, unspecified organism: Secondary | ICD-10-CM | POA: Diagnosis not present

## 2018-07-15 LAB — BASIC METABOLIC PANEL
Anion gap: 12 (ref 5–15)
BUN: 15 mg/dL (ref 8–23)
CO2: 19 mmol/L — AB (ref 22–32)
Calcium: 8 mg/dL — ABNORMAL LOW (ref 8.9–10.3)
Chloride: 109 mmol/L (ref 98–111)
Creatinine, Ser: 0.98 mg/dL (ref 0.61–1.24)
GFR calc non Af Amer: >60 mL/min (ref >60)
Glucose, Bld: 73 mg/dL (ref 70–99)
POTASSIUM: 3.2 mmol/L — AB (ref 3.5–5.1)
Sodium: 140 mmol/L (ref 135–145)

## 2018-07-15 LAB — CBC
HEMATOCRIT: 29.9 % — AB (ref 39.0–52.0)
Hemoglobin: 9.8 g/dL — ABNORMAL LOW (ref 13.0–17.0)
MCH: 33.3 pg (ref 26.0–34.0)
MCHC: 32.8 g/dL (ref 30.0–36.0)
MCV: 101.7 fL — AB (ref 78.0–100.0)
Platelets: 144 10*3/uL — ABNORMAL LOW (ref 150–400)
RBC: 2.94 MIL/uL — AB (ref 4.22–5.81)
RDW: 13.2 % (ref 11.5–15.5)
WBC: 8.2 10*3/uL (ref 4.0–10.5)

## 2018-07-15 MED ORDER — POTASSIUM CHLORIDE 10 MEQ/100ML IV SOLN
10.0000 meq | INTRAVENOUS | Status: AC
  Administered 2018-07-15 (×4): 10 meq via INTRAVENOUS
  Filled 2018-07-15 (×4): qty 100

## 2018-07-15 NOTE — Progress Notes (Signed)
  PROGRESS NOTE  Anthony Fisher FEO:[id number] DOB: 18-Jan-1926 DOA: 07/13/2018 PCP: Anthony Dy, MD  Brief Narrative: 84 year old man PMH intracerebral hemorrhage with residual left-sided weakness, diastolic CHF, dementia who presented with altered mental status, fever, cough.  Admitted for suspected sepsis of unclear source.  Assessment/Plan Sepsis, secondary to UTI versus aspiration pneumonia.   Although urinalysis unimpressive, urine culture greater than 100,000 colonies of E. coli.  Chest x-ray to my I might suggest right lower lobe infiltrate.  Given acute encephalopathy, cough, there is concern for aspiration.  Speech therapy evaluation appreciated, started on dysphagia 1 diet.  Respiratory panel negative. --Improving.  Continue antibiotics, follow-up culture data.  May be able to change to oral antibiotics tomorrow and discharge in the next 24 hours if continues to improve.    Acute metabolic encephalopathy secondary to presumed sepsis --Likely close to baseline at this point.  Continue antibiotics as above.  Left ear lesion, back of pinna. 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  Discussed with granddaughter, recommend outpatient dermatology evaluation.  Dementia  --Appears stable.  Dysphagia.  Likely related to dementia, acute illness. --Diet per speech therapy.  Chronic diastolic CHF --Appears compensated.  PMH stroke   DVT prophylaxis: SCDs Code Status: DNR  Family Communication: granddaughter at bedside. Disposition Plan: home    Anthony Hodgkins, MD  [facility] Direct contact: [phone] --Via amion app [place]  --[email or web address]; password [email or web address]  7PM-7AM contact night coverage as above 07/15/2018, 1:07 PM  LOS: 2 days   Consultants:    Procedures:    Antimicrobials:  Unasyn 8/8 >>  Zosyn  Vancomycin  Interval history/Subjective: No complaints, awake.  Granddaughter bedside reports that he is more awake today.  She reports concern over growth on his  left ear.  Objective: Vitals:  Vitals:   07/15/18 0755 07/15/18 1220  BP: 137/62 (!) 151/62  Pulse: 63 64  Resp: 20 (!) 21  Temp: 99.5 F (37.5 C) 98.9 F (37.2 C)  SpO2: 100% 98%    Exam: Constitutional:   . Appears calm and comfortable.  Awake, alert. ENMT:  . Somewhat hard of hearing. . Left ear pinna, back of the ear there is a large exophytic growth with some ulceration. Neck:  . neck appears normal, no masses, normal ROM, supple . no thyromegaly Respiratory:  . CTA bilaterally, no w/r/r.  . Respiratory effort normal.  Cardiovascular:  . RRR, no m/r/g . No LE extremity edema   Psychiatric:  . Mental status o Mood, affect appropriate  I have personally reviewed the following:   Labs:  Potassium 3.2, remainder BMP unremarkable.  Hemoglobin 9.8, platelets 144.  Urine culture growing Escherichia coli  Scheduled Meds: Continuous Infusions: . sodium chloride 100 mL/hr at 07/15/18 0645  . ampicillin-sulbactam (UNASYN) IV Stopped (07/15/18 5830)  . potassium chloride 10 mEq (07/15/18 1253)    Principal Problem:   Sepsis ([place]) Active Problems:   Dementia   Acute metabolic encephalopathy   Essential hypertension   Chronic diastolic CHF (congestive heart failure) (HCC)   Aspiration pneumonia ([place])   Acute lower UTI   LOS: 2 days

## 2018-07-15 NOTE — Evaluation (Signed)
Clinical/Bedside Swallow Evaluation Patient Details  Name: Anthony Fisher MRN: [id number] Date of Birth: 11-17-1926  Today's Date: 07/15/2018 Time: SLP Start Time (ACUTE ONLY): 7591 SLP Stop Time (ACUTE ONLY): 6384 SLP Time Calculation (min) (ACUTE ONLY): 17 min  Past Medical History:  Past Medical History:  Diagnosis Date  . Adenomatous polyp of colon   . Dementia    Past Surgical History:  Past Surgical History:  Procedure Laterality Date  . CATARACT EXTRACTION W/ INTRAOCULAR LENS  IMPLANT, BILATERAL     HPI:  Anthony Fisher is a 84 y.o. male with medical history significant of ICH with left-sided weakness, hypertension, dCHF, dementia, who presents with altered mental status, fever and cough. CXR clear. MBS 2018 recommended Dys 2/thin, no straws small sips. Pt had sensed aspriation of thin, eliminated by small sips.    Assessment / Plan / Recommendation Clinical Impression   Pt presents with baseline oropharyngeal dysphagia, some of which is attributable to cognitive impairment. He is alert today, no family present to confirm baseline mentation. He is oriented to self only; most verbal responses unintelligible and low vocal intensity. SLP assisted pt with performing oral care and he was able to follow command to expectorate; oral containment is poor and pt has excessive saliva at baseline. He requires tactile assist for self feeding. SLP provided assistance for small cup sips of thin liquids, bites of puree. Intermittent anterior spillage and I suspect delayed swallow initiation with liquids. There are no overt signs of aspiration. Mastication of softened chopped solid was prolonged, with generally poor awareness of bolus. Would initiate dys 1, thin liquids via cup, NO STRAWS, meds crushed in puree, with full supervision/assistance for self feeding. SLP will follow up for tolerance and possible advancement of solids pending improvements in mentation/awareness.     SLP Visit Diagnosis:  Dysphagia, unspecified (R13.10)    Aspiration Risk  Mild aspiration risk;Moderate aspiration risk    Diet Recommendation Dysphagia 1 (Puree);Thin liquid   Liquid Administration via: Cup;No straw Medication Administration: Crushed with puree Supervision: Staff to assist with self feeding;Full supervision/cueing for compensatory strategies Compensations: Slow rate;Small sips/bites;Minimize environmental distractions Postural Changes: Seated upright at 90 degrees    Other  Recommendations Oral Care Recommendations: Oral care BID   Follow up Recommendations Skilled Nursing facility      Frequency and Duration min 1 x/week  1 week       Prognosis Prognosis for Safe Diet Advancement: Good Barriers to Reach Goals: Cognitive deficits      Swallow Study   General Date of Onset: 07/13/18 HPI: MIKAEEL Fisher is a 84 y.o. male with medical history significant of ICH with left-sided weakness, hypertension, dCHF, dementia, who presents with altered mental status, fever and cough. CXR clear. MBS 2018 recommended Dys 2/thin, no straws small sips. Pt had sensed aspriation of thin, eliminated by small sips.  Type of Study: Bedside Swallow Evaluation Previous Swallow Assessment: see hpi Diet Prior to this Study: NPO Temperature Spikes Noted: Yes(99.5) Respiratory Status: Room air History of Recent Intubation: No Behavior/Cognition: Alert;Confused;Distractible;Requires cueing Oral Cavity Assessment: Excessive secretions Oral Care Completed by SLP: Yes Oral Cavity - Dentition: Missing dentition;Poor condition Self-Feeding Abilities: Needs assist Patient Positioning: Upright in bed Baseline Vocal Quality: Low vocal intensity Volitional Cough: Cognitively unable to elicit Volitional Swallow: Unable to elicit    Oral/Motor/Sensory Function Overall Oral Motor/Sensory Function: Generalized oral weakness(limited assessment given cognitive impairment)   Ice Chips Ice chips: Not tested   Thin  Liquid Thin Liquid:  Impaired Presentation: Cup;Self Fed(with tactile assist) Oral Phase Impairments: Poor awareness of bolus;Reduced labial seal Oral Phase Functional Implications: Right anterior spillage Pharyngeal  Phase Impairments: Suspected delayed Swallow    Nectar Thick Nectar Thick Liquid: Not tested   Honey Thick Honey Thick Liquid: Not tested   Puree Puree: Impaired Presentation: Self Fed;Spoon(with tactile assist) Oral Phase Impairments: Poor awareness of bolus Oral Phase Functional Implications: Left anterior spillage   Solid     Solid: Impaired Presentation: Spoon Oral Phase Impairments: Impaired mastication;Poor awareness of bolus Oral Phase Functional Implications: Prolonged oral transit     Deneise Lever, MS, CCC-SLP Speech-Language Pathologist [phone]  Aliene Altes 07/15/2018,9:03 AM

## 2018-07-16 DIAGNOSIS — F0150 Vascular dementia without behavioral disturbance: Secondary | ICD-10-CM

## 2018-07-16 DIAGNOSIS — N390 Urinary tract infection, site not specified: Secondary | ICD-10-CM | POA: Diagnosis not present

## 2018-07-16 DIAGNOSIS — J690 Pneumonitis due to inhalation of food and vomit: Secondary | ICD-10-CM | POA: Diagnosis not present

## 2018-07-16 DIAGNOSIS — R4182 Altered mental status, unspecified: Secondary | ICD-10-CM | POA: Diagnosis not present

## 2018-07-16 DIAGNOSIS — A419 Sepsis, unspecified organism: Secondary | ICD-10-CM | POA: Diagnosis not present

## 2018-07-16 DIAGNOSIS — G9341 Metabolic encephalopathy: Secondary | ICD-10-CM | POA: Diagnosis not present

## 2018-07-16 DIAGNOSIS — A4151 Sepsis due to Escherichia coli [E. coli]: Secondary | ICD-10-CM | POA: Diagnosis not present

## 2018-07-16 LAB — URINE CULTURE

## 2018-07-16 MED ORDER — AMOXICILLIN-POT CLAVULANATE 875-125 MG PO TABS: 1.0000 | ORAL_TABLET | Freq: Two times a day (BID) | ORAL | 0 refills | Status: AC

## 2018-07-16 NOTE — Care Management Note (Addendum)
Case Management Note  Patient Details  Name: Anthony Fisher MRN: [id number] Date of Birth: [birth year]  Subjective/Objective:    Patient for dc today, will need HHPT, [place], [place], they chose [facility] from agency list, referral made to [facility], soc will begin 24-48 hrs post dc.    NCM received call from patient's son, he states he would like to go with [facility] and its ok if the soc is late next week.  NCM informed him that will make referral for [facility].  NCM contacted [facility], and she is checking to make sure they can provide services she will call me back.                Action/Plan: DC home [employer] services when ready.   Expected Discharge Date:  07/16/18               Expected Discharge Plan:  [place]  In-House Referral:     Discharge planning Services  CM Consult  Post Acute Care Choice:  Home Health Choice offered to:  Adult Children  DME Arranged:    DME Agency:     HH Arranged:  OT, PT, Speech Therapy HH Agency:   [facility]  Status of Service:  Completed, signed off  If discussed at [place] of Stay Meetings, dates discussed:    Additional Comments:  Zenon Mayo, RN 07/16/2018, 11:54 AM

## 2018-07-16 NOTE — Care Management Note (Addendum)
Case Management Note  Patient Details  Name: Anthony Fisher MRN: [id number] Date of Birth: [birth year]  Subjective/Objective:    Patient for dc today, will need HHPT, [place], [place], they chose [facility] from agency list, referral made to [facility], soc will begin 24-48 hrs post dc.    NCM received call from patient's son, he states he would like to go with [facility] and its ok if the soc is late next week.  NCM informed him that will make referral for [facility].  NCM contacted [facility], and she is checking to make sure they can provide services she will call me back.  [facility] states they can take patient as long as they are ok with services starting late next week.  Son states he is ok with this. Informed Jermaine with [facility] to cancel.  Soc will begin late next week.             Action/Plan: DC home with [facility] services.  Expected Discharge Date:  07/16/18               Expected Discharge Plan:  [place]  In-House Referral:     Discharge planning Services  CM Consult  Post Acute Care Choice:  Home Health Choice offered to:  Adult Children  DME Arranged:    DME Agency:     HH Arranged:  OT, PT, Speech Therapy [place] Agency:  [facility]  Status of Service:  Completed, signed off  If discussed at [employer] of Stay Meetings, dates discussed:    Additional Comments:  Zenon Mayo, RN 07/16/2018, 12:51 PM

## 2018-07-16 NOTE — Discharge Summary (Signed)
Physician Discharge Summary  Anthony Fisher PPI:[id number] DOB: 1925-12-22 DOA: 07/13/2018  PCP: Lorene Dy, MD  Admit date: 07/13/2018 Discharge date: 07/16/2018  Recommendations for Outpatient Follow-up:  1. Resolution of infection 2. Further evaluation of left ear lesion.  Follow-up Information    Lorene Dy, MD. Schedule an appointment as soon as possible for a visit in 1 week(s).   Specialty:  Internal Medicine Contact information: [place], [place] [address] [phone]            Discharge Diagnoses:  1. Sepsis, secondary to UTI, aspiration pneumonia.  2. Acute metabolic encephalopathy secondary to presumed sepsis 3. Left ear lesion, back of pinna. 4. Dementia  5. Dysphagia 6. Chronic diastolic CHF  Discharge Condition: improved Disposition: home with HHPT, [facility]  Diet recommendation:  Dysphagia 1 (Puree);Thin liquid   Liquid Administration via: Cup;No straw Medication Administration: Crushed with puree Supervision: Staff to assist with self feeding;Full supervision/cueing for compensatory strategies Compensations: Slow rate;Small sips/bites;Minimize environmental distractions Postural Changes: Seated upright at 90 degrees   Filed Weights   07/13/18 1824  Weight: 83.9 kg    History of present illness:  84 year old man PMH intracerebral hemorrhage with residual left-sided weakness, diastolic CHF, dementia who presented with altered mental status, fever, cough.  Admitted for suspected sepsis of unclear source.  Hospital Course:  Patient was admitted, treated with broad-spectrum antibiotics, he rapidly improved with resolution of fever and leukocytosis.  Antibiotics were narrowed based on suspected pneumonia with possible aspiration as well as UTI.  UTI is notable to be ESBL Escherichia coli.  Given the patient's clinical improvement, resolution of fever and leukocytosis, while on penicillins, will discharge home on Augmentin.  Mental  status appears to return to baseline.  He was assessed by physical therapy.  Daughter requested discharge home and did not want patient to go to a skilled facility.  Plan for home health.  Sepsis, secondary to UTI versus aspiration pneumonia. Although urinalysis unimpressive, urine culture greater than 100,000 colonies of E. coli.  Chest x-ray suggested right lower lobe infiltrate.  Given acute encephalopathy, cough, there was concern for aspiration.  Speech therapy evaluation appreciated, started on dysphagia 1 diet.  Respiratory panel negative. --Will complete oral antibiotics as an outpatient.  Acute metabolic encephalopathy secondary to presumed sepsis --Resolved with treatment of infection.  Left ear lesion, back of pinna. 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  Discussed with granddaughter and daughter, recommend outpatient dermatology evaluation.  Dementia  --Remained stable  Dysphagia.  Likely related to dementia, acute illness. --Diet per speech therapy.  Plan for home health speech therapy.  Chronic diastolic CHF --Remained compensated  Antimicrobials:  Unasyn 8/8 >> 8/10  Augmentin 8/10 >> 8/14  Zosyn  Vancomycin  Today's assessment: S: feels fine, no complaints. Daughter at bedside notes patient has improved compared to admission. O: Vitals:  Vitals:   07/16/18 0331 07/16/18 0735  BP: (!) 154/69 (!) 160/66  Pulse: 72 61  Resp: 18 20  Temp: 98.6 F (37 C) 98.1 F (36.7 C)  SpO2: 94% 95%    Constitutional:  . Appears calm and comfortable Eyes:  . pupils and irises appear normal ENMT:  . grossly normal hearing  Respiratory:  . CTA bilaterally, no w/r/r.  . Respiratory effort normal.  Cardiovascular:  . RRR, no m/r/g . No LE extremity edema   Musculoskeletal:  . Moves all extremities Psychiatric:  . Mental status o Mood, affect appropriate No labs today UC reviewed  Discharge Instructions  Discharge Instructions  Discharge instructions    Complete by:  As directed    Call your physician or seek immediate medical attention for pain, fever, confusion, weakness or worsening of condition. Dysphagia 1 (Puree);Thin liquid   Liquid Administration via: Cup;No straw Medication Administration: Crushed with puree Compensations: Slow rate;Small sips/bites;Minimize environmental distractions Postural Changes: Seated upright at 90 degrees   Increase activity slowly   Complete by:  As directed      Allergies as of 07/16/2018      Reactions   Tape Other (See Comments)   SKIN IS THIN (MAY TEAR AND/OR BRUISE EASILY)      Medication List    TAKE these medications   amoxicillin-clavulanate 875-125 MG tablet Commonly known as:  AUGMENTIN Take 1 tablet by mouth 2 (two) times daily for 10 doses. Diet requires medications be crushed; if necessary to change to liquid formulation.   aspirin EC 81 MG tablet Take 1 tablet (81 mg total) by mouth daily.      Allergies  Allergen Reactions  . Tape Other (See Comments)    SKIN IS THIN (MAY TEAR AND/OR BRUISE EASILY)    The results of significant diagnostics from this hospitalization (including imaging, microbiology, ancillary and laboratory) are listed below for reference.    Significant Diagnostic Studies: Dg Chest 2 View  Result Date: 07/13/2018 CLINICAL DATA:  Weakness. EXAM: CHEST - 2 VIEW COMPARISON:  05/17/2018. FINDINGS: The cardiac silhouette remains borderline enlarged. The aorta remains tortuous and possibly diffusely enlarged. The interstitial markings remain mildly prominent in the lungs are clear. Normal vascularity. Diffuse osteopenia. IMPRESSION: 1. No acute abnormality. 2. Stable borderline cardiomegaly, tortuous and possibly diffusely enlarged thoracic aorta and mild chronic interstitial lung disease. Electronically Signed   By: Claudie Revering M.D.   On: 07/13/2018 19:51    Microbiology: Recent Results (from the past 240 hour(s))  Culture, blood (Routine x 2)     Status:  None (Preliminary result)   Collection Time: 07/13/18  6:43 PM  Result Value Ref Range Status   Specimen Description BLOOD LEFT ANTECUBITAL  Final   Special Requests   Final    BOTTLES DRAWN AEROBIC AND ANAEROBIC Blood Culture adequate volume   Culture   Final    NO GROWTH 3 DAYS Performed at [facility], [address] [place], [place] [address]    Report Status PENDING  Incomplete  Culture, blood (Routine x 2)     Status: None (Preliminary result)   Collection Time: 07/13/18  6:45 PM  Result Value Ref Range Status   Specimen Description BLOOD LEFT ANTECUBITAL  Final   Special Requests   Final    BOTTLES DRAWN AEROBIC AND ANAEROBIC Blood Culture results may not be optimal due to an excessive volume of blood received in culture bottles   Culture   Final    NO GROWTH 3 DAYS Performed at [facility], [place] [address] [place], [place] [address]    Report Status PENDING  Incomplete  Urine culture     Status: Abnormal   Collection Time: 07/13/18  7:03 PM  Result Value Ref Range Status   Specimen Description URINE, RANDOM  Final   Special Requests   Final    NONE Performed at [facility], [place] [address], [place], [place] [address]    Culture (A)  Final    >=100,000 COLONIES/mL ESCHERICHIA COLI Confirmed Extended Spectrum Beta-Lactamase Producer (ESBL).  In bloodstream infections from ESBL organisms, carbapenems are preferred over piperacillin/tazobactam. They  are shown to have a lower risk of mortality.    Report Status 07/16/2018 FINAL  Final   Organism ID, Bacteria ESCHERICHIA COLI (A)  Final      Susceptibility   Escherichia coli - MIC*    AMPICILLIN >=32 RESISTANT Resistant     CEFAZOLIN >=64 RESISTANT Resistant     CEFTRIAXONE >=64 RESISTANT Resistant     CIPROFLOXACIN >=4 RESISTANT Resistant     GENTAMICIN <=1 SENSITIVE Sensitive     IMIPENEM <=0.25 SENSITIVE Sensitive     NITROFURANTOIN <=16 SENSITIVE Sensitive     TRIMETH/SULFA >=320 RESISTANT  Resistant     AMPICILLIN/SULBACTAM 8 SENSITIVE Sensitive     PIP/TAZO <=4 SENSITIVE Sensitive     Extended ESBL POSITIVE Resistant     * >=100,000 COLONIES/mL ESCHERICHIA COLI  Respiratory Panel by PCR     Status: None   Collection Time: 07/13/18  9:40 PM  Result Value Ref Range Status   Adenovirus NOT DETECTED NOT DETECTED Final   Coronavirus 229E NOT DETECTED NOT DETECTED Final   Coronavirus HKU1 NOT DETECTED NOT DETECTED Final   Coronavirus NL63 NOT DETECTED NOT DETECTED Final   Coronavirus OC43 NOT DETECTED NOT DETECTED Final   Metapneumovirus NOT DETECTED NOT DETECTED Final   Rhinovirus / Enterovirus NOT DETECTED NOT DETECTED Final   Influenza A NOT DETECTED NOT DETECTED Final   Influenza B NOT DETECTED NOT DETECTED Final   Parainfluenza Virus 1 NOT DETECTED NOT DETECTED Final   Parainfluenza Virus 2 NOT DETECTED NOT DETECTED Final   Parainfluenza Virus 3 NOT DETECTED NOT DETECTED Final   Parainfluenza Virus 4 NOT DETECTED NOT DETECTED Final   Respiratory Syncytial Virus NOT DETECTED NOT DETECTED Final   Bordetella pertussis NOT DETECTED NOT DETECTED Final   Chlamydophila pneumoniae NOT DETECTED NOT DETECTED Final   Mycoplasma pneumoniae NOT DETECTED NOT DETECTED Final    Comment: Performed at [facility], [place]. [address], [place], [place] [address]     Labs: Basic Metabolic Panel: Recent Labs  Lab 07/13/18 1830 07/15/18 0428  NA 136 140  K 3.9 3.2*  CL 103 109  CO2 20* 19*  GLUCOSE 149* 73  BUN 21 15  CREATININE 1.05 0.98  CALCIUM 8.6* 8.0*   Liver Function Tests: Recent Labs  Lab 07/13/18 1830  AST 25  ALT 12  ALKPHOS 60  BILITOT 2.0*  PROT 5.8*  ALBUMIN 3.2*   CBC: Recent Labs  Lab 07/13/18 1830 07/15/18 0428  WBC 17.7* 8.2  NEUTROABS 15.2*  --   HGB 11.9* 9.8*  HCT 35.1* 29.9*  MCV 98.6 101.7*  PLT 213 144*    Recent Labs    07/13/18 2136  BNP 211.9*     Principal Problem:   Sepsis ([place]) Active Problems:   Dementia   Acute  metabolic encephalopathy   Essential hypertension   Chronic diastolic CHF (congestive heart failure) (HCC)   Aspiration pneumonia (HCC)   Acute lower UTI   Time coordinating discharge: 35 minutes  Signed:  [name], MD [facility] 07/16/2018, 10:35 AM

## 2018-07-16 NOTE — Progress Notes (Signed)
Patient ready for discharge to home with his son;family does not want to seek SNF for discharge per PT recommendation; home health care has been arranged by case management; patient dressed by 2 staff members; transferred to the wheelchair and assisted into his son's vechicle for transport home; son states he has a wheelchair at the residence and assistance to get his father into the home. Discharge instructions given and reviewed; rx sent electronically.

## 2018-07-16 NOTE — Evaluation (Signed)
Physical Therapy Evaluation Patient Details Name: Anthony Fisher MRN: [id number] DOB: 21-Sep-1926 Today's Date: 07/16/2018   History of Present Illness  Pt is a 84 y/o male with past medical history significant of ICH with left-sided weakness, hypertension, dCHF, dementia, who presents with altered mental status, fever and cough. Pt found to be septic.  Clinical Impression  Pt presented supine in bed with HOB elevated, awake and willing to participate in therapy session. Pt's daughter present throughout session as well. Prior to admission, pt's daughter reported that pt was able to ambulate house-hold distances with RW and one person assist. He required total A for ADLs from family. Pt currently requires max-total A x2 for bed mobility and mod-max A x2 for sit<>stand transfers from EOB. Pt would continue to benefit from skilled physical therapy services at this time while admitted and after d/c to address the below listed limitations in order to improve overall safety and independence with functional mobility.     Follow Up Recommendations SNF    Equipment Recommendations  Other (comment)(HOYER LIFT)    Recommendations for Other Services       Precautions / Restrictions Precautions Precautions: Fall Restrictions Weight Bearing Restrictions: No      Mobility  Bed Mobility Overal bed mobility: Needs Assistance Bed Mobility: Supine to Sit;Sit to Supine     Supine to sit: Max assist Sit to supine: Total assist;+2 for physical assistance   General bed mobility comments: increased time and effort, verbal and tactile cueing for sequencing, assist with bilateral LE movement onto and off of bed, assistance with trunk elevation and to guide trunk back to bed  Transfers Overall transfer level: Needs assistance Equipment used: Rolling walker (2 wheeled) Transfers: Sit to/from Stand Sit to Stand: Mod assist;Max assist;+2 physical assistance;From elevated surface         General  transfer comment: increased time and effort, verbal and tactile cueing for sequencing and hand placement, mod A x2 with first sit<>stand from EOB, max A x2 for second and third trial  Ambulation/Gait                Stairs            Wheelchair Mobility    Modified Rankin (Stroke Patients Only)       Balance Overall balance assessment: Needs assistance Sitting-balance support: Feet supported Sitting balance-Leahy Scale: Poor Sitting balance - Comments: fluctuating between needing mod A to close min guard for brief periods Postural control: Posterior lean Standing balance support: During functional activity;Bilateral upper extremity supported Standing balance-Leahy Scale: Poor Standing balance comment: mod-max A x2                             Pertinent Vitals/Pain Pain Assessment: Faces Faces Pain Scale: No hurt    Home Living Family/patient expects to be discharged to:: Private residence Living Arrangements: Children Available Help at Discharge: Family;Available 24 hours/day Type of Home: House Home Access: Ramped entrance     Home Layout: One level Home Equipment: Walker - 2 wheels;Hospital bed;Bedside commode;Wheelchair - manual      Prior Function Level of Independence: Needs assistance   Gait / Transfers Assistance Needed: ambulates with RW in home with assistance from family; uses w/c for community level distances  ADL's / Homemaking Assistance Needed: depedent - family assists        Hand Dominance   Dominant Hand: Right    Extremity/Trunk Assessment   Upper Extremity  Assessment Upper Extremity Assessment: Generalized weakness(residual L sided weakness from prior CVA)    Lower Extremity Assessment Lower Extremity Assessment: Generalized weakness(residual L sided weakness from previous CVA)       Communication   Communication: Expressive difficulties  Cognition Arousal/Alertness: Lethargic Behavior During Therapy: Flat  affect Overall Cognitive Status: History of cognitive impairments - at baseline Area of Impairment: Orientation;Attention;Memory;Following commands;Safety/judgement;Problem solving                 Orientation Level: Disoriented to;Place;Situation;Time Current Attention Level: Focused Memory: Decreased recall of precautions;Decreased short-term memory Following Commands: Follows one step commands inconsistently;Follows one step commands with increased time Safety/Judgement: Decreased awareness of safety;Decreased awareness of deficits   Problem Solving: Slow processing;Decreased initiation;Difficulty sequencing;Requires verbal cues;Requires tactile cues        General Comments      Exercises     Assessment/Plan    PT Assessment Patient needs continued PT services  PT Problem List Decreased strength;Decreased activity tolerance;Decreased balance;Decreased mobility;Decreased coordination;Decreased knowledge of use of DME;Decreased safety awareness;Decreased knowledge of precautions       PT Treatment Interventions DME instruction;Gait training;Stair training;Functional mobility training;Therapeutic activities;Therapeutic exercise;Neuromuscular re-education;Balance training;Patient/family education    PT Goals (Current goals can be found in the Care Plan section)  Acute Rehab PT Goals Patient Stated Goal: unable to state PT Goal Formulation: With family Time For Goal Achievement: 07/30/18 Potential to Achieve Goals: Fair    Frequency Min 2X/week   Barriers to discharge        Co-evaluation               AM-PAC PT "6 Clicks" Daily Activity  Outcome Measure Difficulty turning over in bed (including adjusting bedclothes, sheets and blankets)?: Unable Difficulty moving from lying on back to sitting on the side of the bed? : Unable Difficulty sitting down on and standing up from a chair with arms (e.g., wheelchair, bedside commode, etc,.)?: Unable Help needed  moving to and from a bed to chair (including a wheelchair)?: Total Help needed walking in hospital room?: Total Help needed climbing 3-5 steps with a railing? : Total 6 Click Score: 6    End of Session Equipment Utilized During Treatment: Gait belt Activity Tolerance: Patient limited by fatigue Patient left: in bed;with call bell/phone within reach;with family/visitor present Nurse Communication: Mobility status;Need for lift equipment PT Visit Diagnosis: Other abnormalities of gait and mobility (R26.89);Muscle weakness (generalized) (M62.81)    Time: 6767-2094 PT Time Calculation (min) (ACUTE ONLY): 41 min   Charges:   PT Evaluation $PT Eval Moderate Complexity: 1 Mod PT Treatments $Therapeutic Activity: 23-37 mins        [place], [place], DPT [place] 07/16/2018, 1:03 PM

## 2018-07-18 LAB — CULTURE, BLOOD (ROUTINE X 2)
CULTURE: NO GROWTH
Culture: NO GROWTH
Special Requests: ADEQUATE

## 2018-08-09 ENCOUNTER — Other Ambulatory Visit: Payer: Self-pay

## 2018-08-09 ENCOUNTER — Encounter (HOSPITAL_COMMUNITY): Payer: Self-pay | Admitting: Emergency Medicine

## 2018-08-09 ENCOUNTER — Emergency Department (HOSPITAL_COMMUNITY): Payer: Medicare Other

## 2018-08-09 ENCOUNTER — Inpatient Hospital Stay (HOSPITAL_COMMUNITY)
Admission: EM | Admit: 2018-08-09 | Discharge: 2018-08-12 | DRG: 871 | Disposition: A | Discharge status: Hospice | Payer: Medicare Other | Attending: Internal Medicine | Admitting: Internal Medicine

## 2018-08-09 DIAGNOSIS — B9620 Unspecified Escherichia coli [E. coli] as the cause of diseases classified elsewhere: Secondary | ICD-10-CM | POA: Diagnosis present

## 2018-08-09 DIAGNOSIS — Z8249 Family history of ischemic heart disease and other diseases of the circulatory system: Secondary | ICD-10-CM

## 2018-08-09 DIAGNOSIS — A419 Sepsis, unspecified organism: Principal | ICD-10-CM | POA: Diagnosis present

## 2018-08-09 DIAGNOSIS — Z7401 Bed confinement status: Secondary | ICD-10-CM | POA: Diagnosis not present

## 2018-08-09 DIAGNOSIS — Z6822 Body mass index (BMI) 22.0-22.9, adult: Secondary | ICD-10-CM | POA: Diagnosis not present

## 2018-08-09 DIAGNOSIS — F0391 Unspecified dementia with behavioral disturbance: Secondary | ICD-10-CM | POA: Diagnosis not present

## 2018-08-09 DIAGNOSIS — A409 Streptococcal sepsis, unspecified: Secondary | ICD-10-CM | POA: Diagnosis not present

## 2018-08-09 DIAGNOSIS — I5032 Chronic diastolic (congestive) heart failure: Secondary | ICD-10-CM | POA: Diagnosis present

## 2018-08-09 DIAGNOSIS — I69351 Hemiplegia and hemiparesis following cerebral infarction affecting right dominant side: Secondary | ICD-10-CM | POA: Diagnosis not present

## 2018-08-09 DIAGNOSIS — G9341 Metabolic encephalopathy: Secondary | ICD-10-CM | POA: Diagnosis present

## 2018-08-09 DIAGNOSIS — Z1612 Extended spectrum beta lactamase (ESBL) resistance: Secondary | ICD-10-CM | POA: Diagnosis present

## 2018-08-09 DIAGNOSIS — F0390 Unspecified dementia without behavioral disturbance: Secondary | ICD-10-CM | POA: Diagnosis present

## 2018-08-09 DIAGNOSIS — I639 Cerebral infarction, unspecified: Secondary | ICD-10-CM | POA: Diagnosis not present

## 2018-08-09 DIAGNOSIS — Z7982 Long term (current) use of aspirin: Secondary | ICD-10-CM | POA: Diagnosis not present

## 2018-08-09 DIAGNOSIS — J690 Pneumonitis due to inhalation of food and vomit: Secondary | ICD-10-CM | POA: Diagnosis not present

## 2018-08-09 DIAGNOSIS — I110 Hypertensive heart disease with heart failure: Secondary | ICD-10-CM | POA: Diagnosis present

## 2018-08-09 DIAGNOSIS — Z66 Do not resuscitate: Secondary | ICD-10-CM | POA: Diagnosis present

## 2018-08-09 DIAGNOSIS — J189 Pneumonia, unspecified organism: Secondary | ICD-10-CM

## 2018-08-09 DIAGNOSIS — A4151 Sepsis due to Escherichia coli [E. coli]: Secondary | ICD-10-CM | POA: Diagnosis not present

## 2018-08-09 DIAGNOSIS — R54 Age-related physical debility: Secondary | ICD-10-CM | POA: Diagnosis present

## 2018-08-09 DIAGNOSIS — A021 Salmonella sepsis: Secondary | ICD-10-CM | POA: Diagnosis not present

## 2018-08-09 DIAGNOSIS — R1310 Dysphagia, unspecified: Secondary | ICD-10-CM | POA: Diagnosis present

## 2018-08-09 DIAGNOSIS — Z515 Encounter for palliative care: Secondary | ICD-10-CM

## 2018-08-09 DIAGNOSIS — A408 Other streptococcal sepsis: Secondary | ICD-10-CM | POA: Diagnosis not present

## 2018-08-09 DIAGNOSIS — Z7189 Other specified counseling: Secondary | ICD-10-CM

## 2018-08-09 DIAGNOSIS — N390 Urinary tract infection, site not specified: Secondary | ICD-10-CM | POA: Diagnosis present

## 2018-08-09 LAB — PROTIME-INR
INR: 1.14
Prothrombin Time: 14.5 seconds (ref 11.4–15.2)

## 2018-08-09 LAB — CBC WITH DIFFERENTIAL/PLATELET
Abs Immature Granulocytes: 0 10*3/uL (ref 0.0–0.1)
Basophils Absolute: 0.1 10*3/uL (ref 0.0–0.1)
Basophils Relative: 1 %
EOS PCT: 1 %
Eosinophils Absolute: 0.1 10*3/uL (ref 0.0–0.7)
HEMATOCRIT: 38.3 % — AB (ref 39.0–52.0)
HEMOGLOBIN: 12.9 g/dL — AB (ref 13.0–17.0)
IMMATURE GRANULOCYTES: 0 %
LYMPHS ABS: 1.6 10*3/uL (ref 0.7–4.0)
LYMPHS PCT: 11 %
MCH: 33.2 pg (ref 26.0–34.0)
MCHC: 33.7 g/dL (ref 30.0–36.0)
MCV: 98.5 fL (ref 78.0–100.0)
MONOS PCT: 7 %
Monocytes Absolute: 1 10*3/uL (ref 0.1–1.0)
NEUTROS PCT: 80 %
Neutro Abs: 11.6 10*3/uL — ABNORMAL HIGH (ref 1.7–7.7)
Platelets: 252 10*3/uL (ref 150–400)
RBC: 3.89 MIL/uL — ABNORMAL LOW (ref 4.22–5.81)
RDW: 13.5 % (ref 11.5–15.5)
WBC: 14.4 10*3/uL — ABNORMAL HIGH (ref 4.0–10.5)

## 2018-08-09 LAB — COMPREHENSIVE METABOLIC PANEL
ALBUMIN: 3.5 g/dL (ref 3.5–5.0)
ALT: 11 U/L (ref 0–44)
AST: 18 U/L (ref 15–41)
Alkaline Phosphatase: 74 U/L (ref 38–126)
Anion gap: 12 (ref 5–15)
BUN: 16 mg/dL (ref 8–23)
CHLORIDE: 102 mmol/L (ref 98–111)
CO2: 25 mmol/L (ref 22–32)
CREATININE: 1.04 mg/dL (ref 0.61–1.24)
Calcium: 9.4 mg/dL (ref 8.9–10.3)
GFR calc Af Amer: >60 mL/min (ref >60)
GFR calc non Af Amer: >60 mL/min (ref >60)
GLUCOSE: 132 mg/dL — AB (ref 70–99)
Potassium: 4.1 mmol/L (ref 3.5–5.1)
SODIUM: 139 mmol/L (ref 135–145)
Total Bilirubin: 0.9 mg/dL (ref 0.3–1.2)
Total Protein: 6.8 g/dL (ref 6.5–8.1)

## 2018-08-09 LAB — URINALYSIS, ROUTINE W REFLEX MICROSCOPIC
BILIRUBIN URINE: NEGATIVE
GLUCOSE, UA: NEGATIVE mg/dL
Ketones, ur: NEGATIVE mg/dL
Nitrite: POSITIVE — AB
PH: 5 (ref 5.0–8.0)
Protein, ur: 30 mg/dL — AB
Specific Gravity, Urine: 1.018 (ref 1.005–1.030)
WBC, UA: >50 WBC/hpf — ABNORMAL HIGH (ref 0–5)

## 2018-08-09 LAB — I-STAT CG4 LACTIC ACID, ED: Lactic Acid, Venous: 1.37 mmol/L (ref 0.5–1.9)

## 2018-08-09 MED ORDER — SODIUM CHLORIDE 0.9 % IV SOLN
2.0000 g | Freq: Once | INTRAVENOUS | Status: AC
  Administered 2018-08-09: 2 g via INTRAVENOUS
  Filled 2018-08-09: qty 2

## 2018-08-09 MED ORDER — ACETAMINOPHEN 650 MG RE SUPP: 650.0000 mg | Freq: Four times a day (QID) | RECTAL | Status: DC | PRN

## 2018-08-09 MED ORDER — IPRATROPIUM-ALBUTEROL 0.5-2.5 (3) MG/3ML IN SOLN: 3.0000 mL | RESPIRATORY_TRACT | Status: DC | PRN

## 2018-08-09 MED ORDER — ONDANSETRON HCL 4 MG PO TABS: 4.0000 mg | ORAL_TABLET | Freq: Four times a day (QID) | ORAL | Status: DC | PRN

## 2018-08-09 MED ORDER — LACTATED RINGERS IV BOLUS
1000.0000 mL | Freq: Once | INTRAVENOUS | Status: AC
  Administered 2018-08-09: 1000 mL via INTRAVENOUS

## 2018-08-09 MED ORDER — ACETAMINOPHEN 325 MG PO TABS: 650.0000 mg | ORAL_TABLET | Freq: Four times a day (QID) | ORAL | Status: DC | PRN

## 2018-08-09 MED ORDER — VANCOMYCIN HCL IN DEXTROSE 750-5 MG/150ML-% IV SOLN
750.0000 mg | Freq: Two times a day (BID) | INTRAVENOUS | Status: DC
  Filled 2018-08-09: qty 150

## 2018-08-09 MED ORDER — METRONIDAZOLE IN NACL 5-0.79 MG/ML-% IV SOLN
500.0000 mg | Freq: Three times a day (TID) | INTRAVENOUS | Status: DC
  Administered 2018-08-09: 500 mg via INTRAVENOUS
  Filled 2018-08-09: qty 100

## 2018-08-09 MED ORDER — SODIUM CHLORIDE 0.9 % IV SOLN: 2.0000 g | Freq: Two times a day (BID) | INTRAVENOUS | Status: DC

## 2018-08-09 MED ORDER — IPRATROPIUM-ALBUTEROL 0.5-2.5 (3) MG/3ML IN SOLN
3.0000 mL | Freq: Four times a day (QID) | RESPIRATORY_TRACT | Status: DC
  Filled 2018-08-09: qty 3

## 2018-08-09 MED ORDER — SODIUM CHLORIDE 0.9 % IV SOLN
3.0000 g | Freq: Three times a day (TID) | INTRAVENOUS | Status: DC
  Administered 2018-08-10 – 2018-08-11 (×4): 3 g via INTRAVENOUS
  Filled 2018-08-09 (×6): qty 3

## 2018-08-09 MED ORDER — VANCOMYCIN HCL IN DEXTROSE 1-5 GM/200ML-% IV SOLN: 1000.0000 mg | Freq: Once | INTRAVENOUS | Status: DC

## 2018-08-09 MED ORDER — VANCOMYCIN HCL 10 G IV SOLR
1500.0000 mg | Freq: Once | INTRAVENOUS | Status: DC
  Administered 2018-08-09: 1500 mg via INTRAVENOUS
  Filled 2018-08-09: qty 1500

## 2018-08-09 MED ORDER — DEXTROSE-NACL 5-0.9 % IV SOLN
INTRAVENOUS | Status: DC
  Administered 2018-08-10 – 2018-08-11 (×3): via INTRAVENOUS

## 2018-08-09 MED ORDER — ONDANSETRON HCL 4 MG/2ML IJ SOLN: 4.0000 mg | Freq: Four times a day (QID) | INTRAMUSCULAR | Status: DC | PRN

## 2018-08-09 MED ORDER — ENOXAPARIN SODIUM 40 MG/0.4ML ~~LOC~~ SOLN
40.0000 mg | SUBCUTANEOUS | Status: DC
  Administered 2018-08-10 – 2018-08-11 (×3): 40 mg via SUBCUTANEOUS
  Filled 2018-08-09 (×3): qty 0.4

## 2018-08-09 NOTE — ED Provider Notes (Addendum)
[facility] EMERGENCY DEPARTMENT Provider Note   CSN: [id number] Arrival date & time: 08/09/18  1914     History   Chief Complaint Chief Complaint  Patient presents with  . Altered Mental Status  . Code Sepsis    HPI Anthony Fisher is a 84 y.o. male.  HPI Shunt with dementia presents from nursing facility. Level 5 caveat secondary to dementia. EMS reports that the patient was found to be listless at his nursing facility.  On reportedly the patient is typically talkative, though demented. Now, he is reportedly more listless than usual. Reportedly the patient has recent admission for sepsis, due to E. coli. The patient himself is listless, but awakens briefly, seemingly denies pain, cannot answer questions about his current circumstances.  Past Medical History:  Diagnosis Date  . Adenomatous polyp of colon   . Dementia     Patient Active Problem List   Diagnosis Date Noted  . Acute lower UTI 07/15/2018  . Acute metabolic encephalopathy 26/94/8546  . Essential hypertension 07/13/2018  . Sepsis ([place]) 07/13/2018  . Chronic diastolic CHF (congestive heart failure) ([place]) 07/13/2018  . Aspiration pneumonia ([place]) 07/13/2018  . Bacterial UTI 04/27/2017  . CHF (congestive heart failure) ([place])   . Fall   . Fever   . Benign essential HTN   . Labile blood pressure   . Prerenal azotemia   . 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 IVH (intraventricular hemorrhage) ([place]) 02/23/2017  . Hypertensive emergency 02/23/2017  . Dysphagia due to recent stroke 02/23/2017  . Urinary retention 02/23/2017  . Dementia 02/23/2017  . Congestive heart failure ([place]) 02/23/2017  . Hypokalemia 02/23/2017  . Thalamic hemorrhage ([place]) 02/23/2017  . ICH (intracerebral hemorrhage) ([place]) - R thalamic hemorrhage 02/16/2017    Past Surgical History:  Procedure Laterality Date  . CATARACT EXTRACTION W/ INTRAOCULAR LENS   IMPLANT, BILATERAL          Home Medications    Prior to Admission medications   Medication Sig Start Date End Date Taking? Authorizing Provider  aspirin EC 81 MG tablet Take 1 tablet (81 mg total) by mouth daily. Patient not taking: Reported on 07/13/2018 05/20/18   Venancio Poisson, NP    Family History Family History  Problem Relation Age of Onset  . Dementia Mother   . Heart attack Father     Social History Social History   Tobacco Use  . Smoking status: Never Smoker  . Smokeless tobacco: Never Used  Substance Use Topics  . Alcohol use: No  . Drug use: No     Allergies   Tape   Review of Systems Review of Systems  Unable to perform ROS: Dementia     Physical Exam Updated Vital Signs BP (!) 153/82   Pulse 86   Temp (!) 100.9 F (38.3 C) (Rectal)   Resp 20   Wt 79.4 kg   SpO2 96%   BMI 22.47 kg/m   Physical Exam  Constitutional: He appears listless. He has a sickly appearance. No distress.  HENT:  Head: Normocephalic and atraumatic.  Eyes: Conjunctivae and EOM are normal.  Cardiovascular: Normal rate and regular rhythm.  Pulmonary/Chest: Effort normal. No stridor. No respiratory distress.  Abdominal: He exhibits no distension.  Musculoskeletal: He exhibits no edema.  Neurological: He appears listless. He displays atrophy.  Patient has delay in responding to commands, is disoriented He does, however, move all 4 extremities to command, briefly. No gross  facial asymmetry, speech is brief, clear  Skin: Skin is warm and dry.  Psychiatric: Cognition and memory are impaired.  Nursing note and vitals reviewed.    ED Treatments / Results  Labs (all labs ordered are listed, but only abnormal results are displayed) Labs Reviewed  COMPREHENSIVE METABOLIC PANEL - Abnormal; Notable for the following components:      Result Value   Glucose, Bld 132 (*)    All other components within normal limits  CBC WITH DIFFERENTIAL/PLATELET - Abnormal; Notable  for the following components:   WBC 14.4 (*)    RBC 3.89 (*)    Hemoglobin 12.9 (*)    HCT 38.3 (*)    Neutro Abs 11.6 (*)    All other components within normal limits  CULTURE, BLOOD (ROUTINE X 2)  CULTURE, BLOOD (ROUTINE X 2)  PROTIME-INR  URINALYSIS, ROUTINE W REFLEX MICROSCOPIC  I-STAT CG4 LACTIC ACID, ED  I-STAT CG4 LACTIC ACID, ED  I-STAT CG4 LACTIC ACID, ED  I-STAT CG4 LACTIC ACID, ED    EKG EKG Interpretation  Date/Time:  Tuesday August 09 2018 21:29:27 EDT Ventricular Rate:  87 PR Interval:    QRS Duration: 155 QT Interval:  402 QTC Calculation: 484 R Axis:   -85 Text Interpretation:  Sinus rhythm RBBB and LAFB Artifact in lead(s) I III aVL V1 V2 V3 No significant change since last tracing Abnormal ekg Confirmed by Carmin Muskrat [id number]) on 08/09/2018 9:51:36 PM   Radiology Dg Chest Port 1 View  Result Date: 08/09/2018 CLINICAL DATA:  Lethargy EXAM: PORTABLE CHEST 1 VIEW COMPARISON:  07/13/2018, 05/17/2018, 02/24/2018, 02/16/2017 FINDINGS: Airspace disease at the right base. No pleural effusion. Stable cardiomegaly. Enlarged mediastinal silhouette, likely augmented by mild rotation. No pneumothorax IMPRESSION: 1. Focal opacity at the right base may reflect a pneumonia. Radiographic follow-up is recommended. 2. Enlarged mediastinal silhouette, likely due to combination of tortuous enlarged aorta and patient rotation. Mild cardiomegaly. Electronically Signed   By: Donavan Foil M.D.   On: 08/09/2018 20:15    Procedures Procedures (including critical care time)  Medications Ordered in ED Medications  metroNIDAZOLE (FLAGYL) IVPB 500 mg (0 mg Intravenous Stopped 08/09/18 2142)  vancomycin (VANCOCIN) 1,500 mg in sodium chloride 0.9 % 500 mL IVPB (1,500 mg Intravenous New Bag/Given 08/09/18 2146)  vancomycin (VANCOCIN) IVPB 750 mg/150 ml premix (has no administration in time range)  ceFEPIme (MAXIPIME) 2 g in sodium chloride 0.9 % 100 mL IVPB (has no administration in time  range)  lactated ringers bolus 1,000 mL (1,000 mLs Intravenous New Bag/Given 08/09/18 2125)  ceFEPIme (MAXIPIME) 2 g in sodium chloride 0.9 % 100 mL IVPB (0 g Intravenous Stopped 08/09/18 2106)     Initial Impression / Assessment and Plan / ED Course  I have reviewed the triage vital signs and the nursing notes.  Pertinent labs & imaging results that were available during my care of the patient were reviewed by me and considered in my medical decision making (see chart for details).    Insert for sepsis given the patient's altered mental status, fever on arrival, the patient had code sepsis called, empiric antibiotics provided. Patient is not hypotensive, and initial fluid resuscitation per protocol not indicated. 9:51 PM Patient in similar condition. Initial findings concerning for pneumonia, and given the patient's recent admission for sepsis, patient has already received broad-spectrum antibiotics, will require admission.  This elderly male presents from nursing home with concern for listlessness. Here the patient does have evidence for dementia, but answers brief  questions appropriately. No evidence for new neurologic deficiency, but patient is febrile here. Given concern for healthcare acquired pneumonia he required broad-spectrum antibiotics, was admitted for further evaluation and management.  Final Clinical Impressions(s) / ED Diagnoses  HCAP   Carmin Muskrat, MD 08/09/18 2152  CRITICAL CARE Performed by: Carmin Muskrat Total critical care time: 35 minutes Critical care time was exclusive of separately billable procedures and treating other patients. Critical care was necessary to treat or prevent imminent or life-threatening deterioration. Critical care was time spent personally by me on the following activities: development of treatment plan with patient and/or surrogate as well as nursing, discussions with consultants, evaluation of patient's response to treatment,  examination of patient, obtaining history from patient or surrogate, ordering and performing treatments and interventions, ordering and review of laboratory studies, ordering and review of radiographic studies, pulse oximetry and re-evaluation of patient's condition.     Carmin Muskrat, MD 08/24/18 [phone]

## 2018-08-09 NOTE — Progress Notes (Signed)
Pharmacy Antibiotic Note  Anthony Fisher is a 84 y.o. male admitted on 08/09/2018 with sepsis.  Pharmacy has been consulted for vancomycin and cefepime dosing. WBCs 14.4, Tmax 100.9, Lac 1.37. Scr 1.04, CrCl ~50.  Now change to unasyn.   Plan: Unasyn 3gm IV q8 hours F/u renal function, cultures and clinical course  Weight: 175 lb (79.4 kg)  Temp (24hrs), Avg:99.9 F (37.7 C), Min:98.8 F (37.1 C), Max:100.9 F (38.3 C)  Recent Labs  Lab 08/09/18 1938 08/09/18 1944  WBC 14.4*  --   CREATININE 1.04  --   LATICACIDVEN  --  1.37    Estimated Creatinine Clearance: 53 mL/min (by C-G formula based on SCr of 1.04 mg/dL).    Allergies  Allergen Reactions  . Tape Other (See Comments)    SKIN IS THIN (MAY TEAR AND/OR BRUISE EASILY)    Antimicrobials this admission: Vanc 9/3 >> 9/3 Cefepime 9/3 >> 9/3 Flagyl 9/3 >>9/3 Unasyn 9/4>>  Dose adjustments this admission:   Microbiology results:   Thank you for allowing pharmacy to be a part of this patient's care.  Excell Seltzer [place] 08/09/2018 11:03 PM

## 2018-08-09 NOTE — ED Notes (Signed)
Per family, pt hasn't had a bowel movement in 9 days. Also report new onset rash to groin area.

## 2018-08-09 NOTE — ED Notes (Signed)
Admitting Provider at bedside. 

## 2018-08-09 NOTE — H&P (Signed)
History and Physical    Anthony Fisher MHD:[id number] DOB: 1926/10/21 DOA: 08/09/2018  PCP: Lorene Dy, MD   Patient coming from: Home   Chief Complaint: Fever  HPI: Anthony Fisher is a 84 y.o. male with medical history significant of CVA with chronic right hemiparesis, bedbound, nonambulatory, moderate to severe dementia, swallow dysfunction, hypertension, and diastolic heart failure.  Patient had a recent hospitalization, August 7 to July 16, 2018, when he was treated sepsis secondary to urinary tract infection and aspiration pneumonia.  He was discharged with dysphagia 1 pure diet and aspiration precautions.  All information from his daughter at the bedside.  Apparently patient was noted to be not himself for the last 24 hours, no specific symptoms, today after eating lunch he spiked his temperature up to 101, he was less reactive and more confused, he was transferred to the hospital for further evaluation.  The family is assisting him with his feedings but he tends to eat fast.    ED Course: He was found septic, right lower lobe pneumonia, received IV antibiotics and referred for admission.  Review of Systems: Limited all information from patient's daughter, patient not able to give information due to dementia and encephalopathy.   1. General: Positive fevers, no chills, no weight gain or weight loss 2. ENT: No runny nose or sore throat, no hearing disturbances 3. Pulmonary: No dyspnea, cough, wheezing, or hemoptysis 4. Cardiovascular: No angina, claudication, lower extremity edema, pnd or orthopnea 5. Gastrointestinal: No nausea or vomiting, no diarrhea . Positive 9 days with constipation 6. Hematology: No easy bruisability or frequent infections 7. Urology: No dysuria, hematuria or increased urinary frequency 8. Dermatology: No rashes. 9. Neurology: No seizures or paresthesias 10. Musculoskeletal: No joint pain or deformities  Past Medical History:  Diagnosis Date  .  Adenomatous polyp of colon   . Dementia     Past Surgical History:  Procedure Laterality Date  . CATARACT EXTRACTION W/ INTRAOCULAR LENS  IMPLANT, BILATERAL       reports that he has never smoked. He has never used smokeless tobacco. He reports that he does not drink alcohol or use drugs.  Allergies  Allergen Reactions  . Tape Other (See Comments)    SKIN IS THIN (MAY TEAR AND/OR BRUISE EASILY)    Family History  Problem Relation Age of Onset  . Dementia Mother   . Heart attack Father      Prior to Admission medications   Medication Sig Start Date End Date Taking? Authorizing Provider  aspirin EC 81 MG tablet Take 1 tablet (81 mg total) by mouth daily. Patient not taking: Reported on 07/13/2018 05/20/18   Venancio Poisson, NP    Physical Exam: Vitals:   08/09/18 2115 08/09/18 2130 08/09/18 2145 08/09/18 2200  BP: (!) 143/96 (!) 153/82 (!) 162/71 (!) 141/91  Pulse: 91 86 94 87  Resp: (!) 24 20 13 18   Temp:      TempSrc:      SpO2: 98% 96% 95% 93%  Weight:        Constitutional: deconditioned and ill looking appearing  Vitals:   08/09/18 2115 08/09/18 2130 08/09/18 2145 08/09/18 2200  BP: (!) 143/96 (!) 153/82 (!) 162/71 (!) 141/91  Pulse: 91 86 94 87  Resp: (!) 24 20 13 18   Temp:      TempSrc:      SpO2: 98% 96% 95% 93%  Weight:       Eyes: PERRL, lids and conjunctivae pale with no  icterus Head normocephalic, nose and ears with no deformities ENMT: Moucus membranes are dry. Posterior pharynx clear of any exudate or lesions.Normal dentition.  Neck: normal, supple, no masses, no thyromegaly Respiratory: decreased breath sounds on anterior auscultation bilaterally, poor inspiratory effort, no wheezing. bibasilar crackles.  Cardiovascular: Regular rate and rhythm, no murmurs / rubs / gallops. No extremity edema. 2+ pedal pulses. No carotid bruits.  Abdomen: no tenderness, no masses palpated. No hepatosplenomegaly. Bowel sounds positive.  Musculoskeletal: no  clubbing / cyanosis. No joint deformity upper and lower extremities. Good ROM, no contractures. Normal muscle tone.  Skin: no rashes, lesions, ulcers. No induration Neurologic: patient is confused and disorientated, right hemiparesis.    Labs on Admission: I have personally reviewed following labs and imaging studies  CBC: Recent Labs  Lab 08/09/18 1938  WBC 14.4*  NEUTROABS 11.6*  HGB 12.9*  HCT 38.3*  MCV 98.5  PLT 762   Basic Metabolic Panel: Recent Labs  Lab 08/09/18 1938  NA 139  K 4.1  CL 102  CO2 25  GLUCOSE 132*  BUN 16  CREATININE 1.04  CALCIUM 9.4   GFR: Estimated Creatinine Clearance: 53 mL/min (by C-G formula based on SCr of 1.04 mg/dL). Liver Function Tests: Recent Labs  Lab 08/09/18 1938  AST 18  ALT 11  ALKPHOS 74  BILITOT 0.9  PROT 6.8  ALBUMIN 3.5   No results for input(s): LIPASE, AMYLASE in the last 168 hours. No results for input(s): AMMONIA in the last 168 hours. Coagulation Profile: Recent Labs  Lab 08/09/18 1938  INR 1.14   Cardiac Enzymes: No results for input(s): CKTOTAL, CKMB, CKMBINDEX, TROPONINI in the last 168 hours. BNP (last 3 results) No results for input(s): PROBNP in the last 8760 hours. HbA1C: No results for input(s): HGBA1C in the last 72 hours. CBG: No results for input(s): GLUCAP in the last 168 hours. Lipid Profile: No results for input(s): CHOL, HDL, LDLCALC, TRIG, CHOLHDL, LDLDIRECT in the last 72 hours. Thyroid Function Tests: No results for input(s): TSH, T4TOTAL, FREET4, T3FREE, THYROIDAB in the last 72 hours. Anemia Panel: No results for input(s): VITAMINB12, FOLATE, FERRITIN, TIBC, IRON, RETICCTPCT in the last 72 hours. Urine analysis:    Component Value Date/Time   COLORURINE AMBER (A) 07/13/2018 1903   APPEARANCEUR HAZY (A) 07/13/2018 1903   LABSPEC 1.020 07/13/2018 1903   PHURINE 5.0 07/13/2018 1903   GLUCOSEU NEGATIVE 07/13/2018 1903   HGBUR LARGE (A) 07/13/2018 1903   BILIRUBINUR NEGATIVE  07/13/2018 1903   KETONESUR NEGATIVE 07/13/2018 1903   PROTEINUR NEGATIVE 07/13/2018 1903   UROBILINOGEN 0.2 05/09/2009 1004   NITRITE NEGATIVE 07/13/2018 1903   LEUKOCYTESUR TRACE (A) 07/13/2018 1903    Radiological Exams on Admission: Dg Chest Port 1 View  Result Date: 08/09/2018 CLINICAL DATA:  Lethargy EXAM: PORTABLE CHEST 1 VIEW COMPARISON:  07/13/2018, 05/17/2018, 02/24/2018, 02/16/2017 FINDINGS: Airspace disease at the right base. No pleural effusion. Stable cardiomegaly. Enlarged mediastinal silhouette, likely augmented by mild rotation. No pneumothorax IMPRESSION: 1. Focal opacity at the right base may reflect a pneumonia. Radiographic follow-up is recommended. 2. Enlarged mediastinal silhouette, likely due to combination of tortuous enlarged aorta and patient rotation. Mild cardiomegaly. Electronically Signed   By: Donavan Foil M.D.   On: 08/09/2018 20:15    EKG: Independently reviewed.  Sinus rhythm, left axis deviation, right bundle branch block.   Assessment/Plan Active Problems:   Sepsis ([place])  84 year old male with moderate to severe dementia, right hemiparesis due to CVA and swallow dysfunction.  Recent hospitalization for sepsis due to aspiration pneumonia, and at home has been on aspiration precautions.  Developed fever and worsening mentation after eating.  He has dry mucous membranes, conjunctival pallor, bibasilar rales on anterior auscultation, heart S1-S2 present and rhythmic, abdomen soft nontender, no lower extremity edema, patient is confused, disoriented and not interactive.  Sodium 139, potassium 4.1, chloride 102, bicarb 25, glucose 132, BUN 16, creatinine 1.0, white count 14.4, hemoglobin 12.9, hematocrit 38.3, platelets 252.  Urinalysis with greater than 50 white cells, 6-10 RBCs, 30 protein specific gravity 1.018.  Chest x-ray with right rotation, right basilar infiltrate, widened mediastinum.  Patient will be admitted to the hospital with the working diagnosis  of sepsis due to right lower lobe aspiration pneumonia complicated by pyuria.  1.  Sepsis due to right lower lobe aspiration pneumonia.  Present on admission.  Patient will be admitted to the medical ward, he will be placed on a remote telemetry monitor, aspiration precautions, antibiotic therapy with Unasyn, oximetry monitoring and supplemental oxygen per nasal cannula, core dilator therapy with DuoNeb.  Swallow evaluation per speech therapy.  Hydration with dextrose and isotonic saline.  2.  Pyuria.  Present on admission.  Patient recently treated for E. coli urinary tract infection, will follow-up cultures.  Back then it was sensitive to ampicillin sulbactam.  3.  Dementia with acute metabolic encephalopathy.  Neuro checks every 4 hours, aspiration precautions, supportive medical therapy, IV fluids and IV antibiotics.  4.  History of CVA with right-sided hemiparesis.  Patient is nonambulatory, very poor prognosis.  DVT prophylaxis: enoxaparin  Code Status: dnr   Family Communication: I spoke with patient's daughter at the bedside and all questions were addressed.   Disposition Plan: Home when recovered  Consults called: none   Admission status: Inpatient    Mauricio Gerome Apley MD [facility] Pager [phone]  If 7PM-7AM, please contact night-coverage [email or web address] Password [email or web address]  08/09/2018, 10:06 PM

## 2018-08-09 NOTE — Progress Notes (Signed)
Pharmacy Antibiotic Note  Anthony Fisher is a 84 y.o. male admitted on 08/09/2018 with sepsis.  Pharmacy has been consulted for vancomycin and cefepime dosing. WBCs 14.4, Tmax 100.9, Lac 1.37. Scr 1.04, CrCl ~50.    Plan: Vancomycin 1500mg  IV x1, then 750mg  Q12H Cefepime 2g Q12H F/u LOT/de-escalation, cultures, renal function, troughs prn     Temp (24hrs), Avg:100.9 F (38.3 C), Min:100.9 F (38.3 C), Max:100.9 F (38.3 C)  No results for input(s): WBC, CREATININE, LATICACIDVEN, VANCOTROUGH, VANCOPEAK, VANCORANDOM, GENTTROUGH, GENTPEAK, GENTRANDOM, TOBRATROUGH, TOBRAPEAK, TOBRARND, AMIKACINPEAK, AMIKACINTROU, AMIKACIN in the last 168 hours.  CrCl cannot be calculated (Patient's most recent lab result is older than the maximum 21 days allowed.).    Allergies  Allergen Reactions  . Tape Other (See Comments)    SKIN IS THIN (MAY TEAR AND/OR BRUISE EASILY)    Antimicrobials this admission: Vanc 9/3 >>  Cefepime 9/3 >>  Flagyl 9/3 >>  Dose adjustments this admission:   Microbiology results:   Thank you for allowing pharmacy to be a part of this patient's care.  Rush Barer 08/09/2018 7:42 PM

## 2018-08-09 NOTE — ED Triage Notes (Signed)
Pt BIB EMS from home with report of new lethargy. Hx of dementia with baseline confusion, but pt normally talkative. Now more lethargic per family. Recently admitted for sepsis d/t e.coli. 100.82F rectal temp upon arrival in ED. Hx CVA with baseline L deficits.

## 2018-08-10 DIAGNOSIS — Z515 Encounter for palliative care: Secondary | ICD-10-CM

## 2018-08-10 DIAGNOSIS — A4151 Sepsis due to Escherichia coli [E. coli]: Secondary | ICD-10-CM

## 2018-08-10 DIAGNOSIS — Z7189 Other specified counseling: Secondary | ICD-10-CM

## 2018-08-10 DIAGNOSIS — J189 Pneumonia, unspecified organism: Secondary | ICD-10-CM

## 2018-08-10 DIAGNOSIS — A409 Streptococcal sepsis, unspecified: Secondary | ICD-10-CM

## 2018-08-10 LAB — BASIC METABOLIC PANEL
Anion gap: 7 (ref 5–15)
BUN: 14 mg/dL (ref 8–23)
CALCIUM: 8.6 mg/dL — AB (ref 8.9–10.3)
CO2: 24 mmol/L (ref 22–32)
CREATININE: 0.84 mg/dL (ref 0.61–1.24)
Chloride: 107 mmol/L (ref 98–111)
Glucose, Bld: 121 mg/dL — ABNORMAL HIGH (ref 70–99)
Potassium: 3.6 mmol/L (ref 3.5–5.1)
Sodium: 138 mmol/L (ref 135–145)

## 2018-08-10 LAB — CBC
HCT: 32.2 % — ABNORMAL LOW (ref 39.0–52.0)
Hemoglobin: 10.8 g/dL — ABNORMAL LOW (ref 13.0–17.0)
MCH: 33 pg (ref 26.0–34.0)
MCHC: 33.5 g/dL (ref 30.0–36.0)
MCV: 98.5 fL (ref 78.0–100.0)
PLATELETS: 195 10*3/uL (ref 150–400)
RBC: 3.27 MIL/uL — AB (ref 4.22–5.81)
RDW: 13.4 % (ref 11.5–15.5)
WBC: 10.7 10*3/uL — ABNORMAL HIGH (ref 4.0–10.5)

## 2018-08-10 MED ORDER — TAMSULOSIN HCL 0.4 MG PO CAPS
0.4000 mg | ORAL_CAPSULE | Freq: Every day | ORAL | Status: DC
  Administered 2018-08-10 – 2018-08-12 (×3): 0.4 mg via ORAL
  Filled 2018-08-10 (×3): qty 1

## 2018-08-10 MED ORDER — SODIUM CHLORIDE 0.9 % IV SOLN
INTRAVENOUS | Status: DC | PRN
  Administered 2018-08-10: 250 mL via INTRAVENOUS

## 2018-08-10 NOTE — Progress Notes (Signed)
[facility] PROGRESS NOTE  BARTLEY VUOLO NFA:[id number] DOB: [birth year] DOA: 08/09/2018   PCP: Lorene Dy, MD     Assessment/Plan: Active Problems:   Sepsis [facility])   84 y.o. male with medical history significant of CVA with chronic right hemiparesis, bedbound, nonambulatory, moderate to severe dementia, swallow dysfunction, hypertension, and diastolic heart failure.  Patient had a recent hospitalization, August 7 to July 16, 2018, when he was treated sepsis secondary to urinary tract infection and aspiration pneumonia.  Now admitted for the same diagnosis   Assessment/Plan 1.  Sepsis due to right lower lobe aspiration pneumonia/ESBL ecoli .  Present on admission. Continue  telemetry monitor, aspiration precautions,swallow eval, antibiotic therapy with Unasyn, oximetry monitoring and supplemental oxygen per nasal cannula, core dilator therapy with DuoNeb.  Swallow evaluation per speech therapy.  Hydration with dextrose and isotonic saline.  2.  Pyuria.hx of ESBL e coli  Present on admission.  Patient recently treated for E. coli urinary tract infection, will follow-up cultures.  Intermediate sensitivity to ampicillin sulbactam. Will consider changing to zosyn  3. Dementia with acute metabolic encephalopathy.  Neuro checks every 4 hours, aspiration precautions, supportive medical therapy, IV fluids and IV antibiotics.  4.  History of CVA with right-sided hemiparesis.  Patient is nonambulatory, very poor prognosis. High risk for recurrent aspiration     DVT prophylaxsis lovenox   Code Status:  DNR    Family Communication: Discussed in detail with the patient, all imaging results, lab results explained to the patient   Disposition Plan:  Continue treating above, palliative care consult  For GOC's      Consultants:  None   Procedures:  None   Antibiotics: Anti-infectives (From admission, onward)   Start     Dose/Rate Route Frequency Ordered Stop    08/10/18 0800  vancomycin (VANCOCIN) IVPB 750 mg/150 ml premix  Status:  Discontinued     750 mg 150 mL/hr over 60 Minutes Intravenous Every 12 hours 08/09/18 2103 08/09/18 2308   08/10/18 0730  ceFEPIme (MAXIPIME) 2 g in sodium chloride 0.9 % 100 mL IVPB  Status:  Discontinued     2 g 200 mL/hr over 30 Minutes Intravenous Every 12 hours 08/09/18 2103 08/09/18 2308   08/10/18 0600  Ampicillin-Sulbactam (UNASYN) 3 g in sodium chloride 0.9 % 100 mL IVPB     3 g 200 mL/hr over 30 Minutes Intravenous Every 8 hours 08/09/18 2307     08/09/18 2000  vancomycin (VANCOCIN) 1,500 mg in sodium chloride 0.9 % 500 mL IVPB  Status:  Discontinued     1,500 mg 250 mL/hr over 120 Minutes Intravenous  Once 08/09/18 1958 08/09/18 2308   08/09/18 1930  ceFEPIme (MAXIPIME) 2 g in sodium chloride 0.9 % 100 mL IVPB     2 g 200 mL/hr over 30 Minutes Intravenous  Once 08/09/18 1927 08/09/18 2106   08/09/18 1930  metroNIDAZOLE (FLAGYL) IVPB 500 mg  Status:  Discontinued     500 mg 100 mL/hr over 60 Minutes Intravenous Every 8 hours 08/09/18 1927 08/09/18 2308   08/09/18 1930  vancomycin (VANCOCIN) IVPB 1000 mg/200 mL premix  Status:  Discontinued     1,000 mg 200 mL/hr over 60 Minutes Intravenous  Once 08/09/18 1927 08/09/18 1958         HPI/Subjective: He is somnolent, daughter by the bedside , she agrees to consider palliative care consultation, Dr. About goals of care  Objective: Vitals:   08/09/18 2230 08/09/18  2245 08/09/18 2341 08/10/18 0643  BP: (!) 163/84  (!) 158/72 129/63  Pulse: 80 80 79 71  Resp: 16 17 20 18   Temp:  98.8 F (37.1 C) 98.4 F (36.9 C) 97.8 F (36.6 C)  TempSrc:  Oral    SpO2: 99% 97% 100% 99%  Weight:      Height:    6' (1.829 m)    Intake/Output Summary (Last 24 hours) at 08/10/2018 1048 Last data filed at 08/10/2018 0300 Gross per 24 hour  Intake 624.18 ml  Output 200 ml  Net 424.18 ml    Exam:  Examination:  General exam: Appears calm and comfortable   Respiratory system: Clear to auscultation. Respiratory effort normal. Cardiovascular system: S1 & S2 heard, RRR. No JVD, murmurs, rubs, gallops or clicks. No pedal edema. Gastrointestinal system: Abdomen is nondistended, soft and nontender. No organomegaly or masses felt. Normal bowel sounds heard. Central nervous system: patient is somnolent but arousable Extremities: Symmetric 5 x 5 power. Skin: No rashes, lesions or ulcers Psychiatry: impaired     Data Reviewed: I have personally reviewed following labs and imaging studies  Micro Results Recent Results (from the past 240 hour(s))  Culture, blood (Routine x 2)     Status: None (Preliminary result)   Collection Time: 08/09/18  8:00 PM  Result Value Ref Range Status   Specimen Description BLOOD RIGHT HAND  Final   Special Requests   Final    BOTTLES DRAWN AEROBIC AND ANAEROBIC Blood Culture adequate volume   Culture   Final    NO GROWTH < 12 HOURS Performed at [facility], [address], [place], [place] [address]    Report Status PENDING  Incomplete  Culture, blood (Routine x 2)     Status: None (Preliminary result)   Collection Time: 08/09/18 10:39 PM  Result Value Ref Range Status   Specimen Description BLOOD BLOOD RIGHT FOREARM  Final   Special Requests   Final    BOTTLES DRAWN AEROBIC AND ANAEROBIC Blood Culture adequate volume   Culture   Final    NO GROWTH < 12 HOURS Performed at [facility], [place] [address] [place], [place] [address]    Report Status PENDING  Incomplete    Radiology Reports Dg Chest 2 View  Result Date: 07/13/2018 CLINICAL DATA:  Weakness. EXAM: CHEST - 2 VIEW COMPARISON:  05/17/2018. FINDINGS: The cardiac silhouette remains borderline enlarged. The aorta remains tortuous and possibly diffusely enlarged. The interstitial markings remain mildly prominent in the lungs are clear. Normal vascularity. Diffuse osteopenia. IMPRESSION: 1. No acute abnormality. 2. Stable borderline  cardiomegaly, tortuous and possibly diffusely enlarged thoracic aorta and mild chronic interstitial lung disease. Electronically Signed   By: Claudie Revering M.D.   On: 07/13/2018 19:51   Dg Chest Port 1 View  Result Date: 08/09/2018 CLINICAL DATA:  Lethargy EXAM: PORTABLE CHEST 1 VIEW COMPARISON:  07/13/2018, 05/17/2018, 02/24/2018, 02/16/2017 FINDINGS: Airspace disease at the right base. No pleural effusion. Stable cardiomegaly. Enlarged mediastinal silhouette, likely augmented by mild rotation. No pneumothorax IMPRESSION: 1. Focal opacity at the right base may reflect a pneumonia. Radiographic follow-up is recommended. 2. Enlarged mediastinal silhouette, likely due to combination of tortuous enlarged aorta and patient rotation. Mild cardiomegaly. Electronically Signed   By: Donavan Foil M.D.   On: 08/09/2018 20:15     CBC Recent Labs  Lab 08/09/18 1938 08/10/18 0421  WBC 14.4* 10.7*  HGB 12.9* 10.8*  HCT 38.3* 32.2*  PLT 252  195  MCV 98.5 98.5  MCH 33.2 33.0  MCHC 33.7 33.5  RDW 13.5 13.4  LYMPHSABS 1.6  --   MONOABS 1.0  --   EOSABS 0.1  --   BASOSABS 0.1  --     Chemistries  Recent Labs  Lab 08/09/18 1938 08/10/18 0421  NA 139 138  K 4.1 3.6  CL 102 107  CO2 25 24  GLUCOSE 132* 121*  BUN 16 14  CREATININE 1.04 0.84  CALCIUM 9.4 8.6*  AST 18  --   ALT 11  --   ALKPHOS 74  --   BILITOT 0.9  --    ------------------------------------------------------------------------------------------------------------------ estimated creatinine clearance is 64.2 mL/min (by C-G formula based on SCr of 0.84 mg/dL). ------------------------------------------------------------------------------------------------------------------ No results for input(s): HGBA1C in the last 72 hours. ------------------------------------------------------------------------------------------------------------------ No results for input(s): CHOL, HDL, LDLCALC, TRIG, CHOLHDL, LDLDIRECT in the last 72  hours. ------------------------------------------------------------------------------------------------------------------ No results for input(s): TSH, T4TOTAL, T3FREE, THYROIDAB in the last 72 hours.  Invalid input(s): FREET3 ------------------------------------------------------------------------------------------------------------------ No results for input(s): VITAMINB12, FOLATE, FERRITIN, TIBC, IRON, RETICCTPCT in the last 72 hours.  Coagulation profile Recent Labs  Lab 08/09/18 1938  INR 1.14    No results for input(s): DDIMER in the last 72 hours.  Cardiac Enzymes No results for input(s): CKMB, TROPONINI, MYOGLOBIN in the last 168 hours.  Invalid input(s): CK ------------------------------------------------------------------------------------------------------------------ Invalid input(s): POCBNP   CBG: No results for input(s): GLUCAP in the last 168 hours.     Studies: Dg Chest Port 1 View  Result Date: 08/09/2018 CLINICAL DATA:  Lethargy EXAM: PORTABLE CHEST 1 VIEW COMPARISON:  07/13/2018, 05/17/2018, 02/24/2018, 02/16/2017 FINDINGS: Airspace disease at the right base. No pleural effusion. Stable cardiomegaly. Enlarged mediastinal silhouette, likely augmented by mild rotation. No pneumothorax IMPRESSION: 1. Focal opacity at the right base may reflect a pneumonia. Radiographic follow-up is recommended. 2. Enlarged mediastinal silhouette, likely due to combination of tortuous enlarged aorta and patient rotation. Mild cardiomegaly. Electronically Signed   By: Donavan Foil M.D.   On: 08/09/2018 20:15      Lab Results  Component Value Date   HGBA1C 5.5 02/16/2017   Lab Results  Component Value Date   LDLCALC 60 05/20/2018   CREATININE 0.84 08/10/2018       Scheduled Meds: . enoxaparin (LOVENOX) injection  40 mg Subcutaneous Q24H  . ipratropium-albuterol  3 mL Nebulization Q6H   Continuous Infusions: . sodium chloride 250 mL (08/10/18 0658)  .  ampicillin-sulbactam (UNASYN) IV 3 g (08/10/18 0700)  . dextrose 5 % and 0.9% NaCl 75 mL/hr at 08/10/18 0031     LOS: 1 day    Time spent: >30 MINS    Reyne Dumas  [facility] Pager [phone]. If 7PM-7AM, please contact night-coverage at [email or web address], password [email or web address] 08/10/2018, 10:48 AM  LOS: 1 day

## 2018-08-10 NOTE — Evaluation (Signed)
Clinical/Bedside Swallow Evaluation Patient Details  Name: Anthony Fisher MRN: [id number] Date of Birth: 16-Jun-1926  Today's Date: 08/10/2018 Time: SLP Start Time (ACUTE ONLY): 6283 SLP Stop Time (ACUTE ONLY): 1338 SLP Time Calculation (min) (ACUTE ONLY): 21 min  Past Medical History:  Past Medical History:  Diagnosis Date  . Adenomatous polyp of colon   . Dementia    Past Surgical History:  Past Surgical History:  Procedure Laterality Date  . CATARACT EXTRACTION W/ INTRAOCULAR LENS  IMPLANT, BILATERAL     HPI:  Pt is a 84 y.o. male who presents with AMS, sepsis secondary to RLL PNA. Medical history significant for CVA with chronic right hemiparesis, bedbound, nonambulatory, moderate to severe dementia, swallow dysfunction, hypertension, and diastolic heart failure.  Patient had a recent hospitalization, August 7 to July 16, 2018, when he was treated for sepsis secondary to UTI and aspiration pneumonia.  He was seen by SLP and discharged with dysphagia 1 pure diet, thin liquids, and aspiration precautions. Most recent MBS 04/2017 recommended Dys 2 diet and thin liquids without straws. Aspiration on that study was sensed with a cough response elicited, but not one strong enough to clear the airway.    Assessment / Plan / Recommendation Clinical Impression  Pt has evidence of a cognitively-based oral dysphagia, including reduced awareness, oral holding, and delayed transit. Pt was also lethargic throughout SLP visit, requiring Mod cues for enough alertness to safely attempt POs. No coughing was observed, although pt's son reports that he does cough intermittently at home with solids and liquids. They are primarily giving him softer, chopped foods and thin liquids. Pt's son is aware of oral impairments, and comments that he needs assistance during meals to reduce pocketing, oral holding, and rapid intake. He is aware of what times of day are typically better for him to eat. He also  acknowledges that the pt will occasionally "aspirate." We discussed the possibility of episodic aspiration in a person with a h/o dysphagia, also with cognitive impairment. We also discussed the impact of dementia on dysphagia and how it can progress. At this point, his son is not interested in pursuing additional testing or diet modifications. He is agreeable to continue with Dys 1 diet and thin liquids with careful assist for feeding, with SLP f/u to assess for potential to advance back to chopped solids. SLP Visit Diagnosis: Dysphagia, unspecified (R13.10)    Aspiration Risk  Moderate aspiration risk;Mild aspiration risk    Diet Recommendation Dysphagia 1 (Puree);Thin liquid   Liquid Administration via: Cup;No straw Medication Administration: Crushed with puree Supervision: Staff to assist with self feeding;Full supervision/cueing for compensatory strategies Compensations: Slow rate;Small sips/bites;Follow solids with liquid Postural Changes: Seated upright at 90 degrees    Other  Recommendations Oral Care Recommendations: Oral care BID   Follow up Recommendations 24 hour supervision/assistance      Frequency and Duration min 2x/week  2 weeks       Prognosis Prognosis for Safe Diet Advancement: Fair Barriers to Reach Goals: Cognitive deficits      Swallow Study   General HPI: Pt is a 84 y.o. male who presents with AMS, sepsis secondary to RLL PNA. Medical history significant for CVA with chronic right hemiparesis, bedbound, nonambulatory, moderate to severe dementia, swallow dysfunction, hypertension, and diastolic heart failure.  Patient had a recent hospitalization, August 7 to July 16, 2018, when he was treated for sepsis secondary to UTI and aspiration pneumonia.  He was seen by SLP and discharged with dysphagia  1 pure diet, thin liquids, and aspiration precautions. Most recent MBS 04/2017 recommended Dys 2 diet and thin liquids without straws. Aspiration on that study was  sensed with a cough response elicited, but not one strong enough to clear the airway.  Type of Study: Bedside Swallow Evaluation Previous Swallow Assessment: see HPI Diet Prior to this Study: Dysphagia 1 (puree);Thin liquids Temperature Spikes Noted: Yes(100.9) Respiratory Status: Room air History of Recent Intubation: No Behavior/Cognition: Lethargic/Drowsy;Requires cueing Oral Cavity Assessment: Within Functional Limits Oral Care Completed by SLP: No Oral Cavity - Dentition: Missing dentition Vision: (mostly keeps eyes closed) Self-Feeding Abilities: Total assist Patient Positioning: Upright in bed Baseline Vocal Quality: Normal    Oral/Motor/Sensory Function Overall Oral Motor/Sensory Function: (difficulty following commands for assessment)   Ice Chips Ice chips: Not tested   Thin Liquid Thin Liquid: Impaired Presentation: Cup Oral Phase Functional Implications: Oral holding    Nectar Thick Nectar Thick Liquid: Not tested   Honey Thick Honey Thick Liquid: Not tested   Puree Puree: Impaired Presentation: Spoon Oral Phase Functional Implications: Prolonged oral transit   Solid     Solid: Not tested      [name] 08/10/2018,1:49 PM  [name], M.A. [place] Acute Environmental education officer [phone] Office [phone]

## 2018-08-10 NOTE — Consult Note (Signed)
Consultation Note Date: 08/10/2018   Patient Name: Anthony Fisher  DOB: [birth year]  MRN: [id number]  Age / Sex: 84 y.o., male  PCP: Lorene Dy, MD Referring Physician: Reyne Dumas, MD  Reason for Consultation: Establishing goals of care  HPI/Patient Profile: 84 y.o. male  with past medical history of dementia, CVA w/ R hemiparesis, bedbound, nonambulatory, dysphagia, HTN, diastolic heart failure (Grade 1, LVH according to 02/2017 ECHO) admitted on 08/09/2018 with increasing somnolence found to have sepsis due to RLL aspiration pneumonia, UTI. Recently hospitalized 8/7-8/10 for same diagnoses. He was treated and discharged home despite recommendations for SNF. Palliative medicine consulted for [place].  Clinical Assessment and Goals of Care: Patient sleeping in bed. Patient's son at bedside.  Noted patient is DNR. Complete chart review conducted. Noted SLP note and discussion with son regarding dysphagia and dementia. Patient's son stated that they were not interested in Palliative Care due to "this is a family issue" and they didn't want any "outside" care.  I introduced Palliative Medicine and clarified purpose and scope of Palliative consult- to assist with determining and advocate GOC.  Offered to contact patient's daughter and arrange time meet- patient's son was agreeable to this.   Primary Decision Maker NEXT OF KIN- patient's children- patient lives with son- Max    SUMMARY OF RECOMMENDATIONS. -PMT will attempt to contact patient's daughter and arrange [place] meeting for tomorrow    Code Status/Advance Care Planning:  DNR  Palliative Prophylaxis:   Aspiration  Prognosis:    Unable to determine  Discharge Planning: To Be Determined  Primary Diagnoses: Present on Admission: . Sepsis ([place])   I have reviewed the medical record, interviewed the patient and family, and examined the patient.  The following aspects are pertinent.  Past Medical History:  Diagnosis Date  . Adenomatous polyp of colon   . Dementia    Social History   Socioeconomic History  . Marital status: Single    Spouse name: Not on file  . Number of children: 2  . Years of education: Not on file  . Highest education level: Not on file  Occupational History  . Not on file  Social Needs  . Financial resource strain: Not on file  . Food insecurity:    Worry: Not on file    Inability: Not on file  . Transportation needs:    Medical: Not on file    Non-medical: Not on file  Tobacco Use  . Smoking status: Never Smoker  . Smokeless tobacco: Never Used  Substance and Sexual Activity  . Alcohol use: No  . Drug use: No  . Sexual activity: Not on file  Lifestyle  . Physical activity:    Days per week: Not on file    Minutes per session: Not on file  . Stress: Not on file  Relationships  . Social connections:    Talks on phone: Not on file    Gets together: Not on file    Attends religious service: Not on file  Active member of club or organization: Not on file    Attends meetings of clubs or organizations: Not on file    Relationship status: Not on file  Other Topics Concern  . Not on file  Social History Narrative   Currently living w/ his daughter and family   Family History  Problem Relation Age of Onset  . Dementia Mother   . Heart attack Father    Scheduled Meds: . enoxaparin (LOVENOX) injection  40 mg Subcutaneous Q24H  . tamsulosin  0.4 mg Oral Daily   Continuous Infusions: . sodium chloride 250 mL (08/10/18 0658)  . ampicillin-sulbactam (UNASYN) IV 3 g (08/10/18 1326)  . dextrose 5 % and 0.9% NaCl 75 mL/hr at 08/10/18 0031   PRN Meds:.sodium chloride, acetaminophen **OR** acetaminophen, ipratropium-albuterol, ondansetron **OR** ondansetron (ZOFRAN) IV Medications Prior to Admission:  Prior to Admission medications   Medication Sig Start Date End Date Taking? Authorizing  Provider  aspirin EC 81 MG tablet Take 1 tablet (81 mg total) by mouth daily. Patient not taking: Reported on 07/13/2018 05/20/18   Venancio Poisson, NP   Allergies  Allergen Reactions  . Tape Other (See Comments)    SKIN IS THIN (MAY TEAR AND/OR BRUISE EASILY)   Review of Systems  Unable to perform ROS: Dementia    Physical Exam  Constitutional: He appears well-developed and well-nourished.  Pulmonary/Chest: Effort normal.  Neurological:  lethargic  Nursing note and vitals reviewed.   Vital Signs: BP (!) 142/52 (BP Location: Left Arm)   Pulse (!) 57   Temp 98.6 F (37 C)   Resp 18   Ht 6' (1.829 m)   Wt 79.4 kg   SpO2 100%   BMI 23.73 kg/m  Pain Scale: PAINAD   Pain Score: 0-No pain   SpO2: SpO2: 100 % O2 Device:SpO2: 100 % O2 Flow Rate: .   IO: Intake/output summary:   Intake/Output Summary (Last 24 hours) at 08/10/2018 1433 Last data filed at 08/10/2018 0300 Gross per 24 hour  Intake 624.18 ml  Output 200 ml  Net 424.18 ml    LBM: Last BM Date: 08/01/18 Baseline Weight: Weight: 79.4 kg Most recent weight: Weight: 79.4 kg     Palliative Assessment/Data: PPS: 20%     Thank you for this consult. Palliative medicine will continue to follow and assist as needed.   Time In: 1330 Time Out: 1445 Time Total: 70 minutes Greater than 50%  of this time was spent counseling and coordinating care related to the above assessment and plan.  Signed by: Mariana Kaufman, AGNP-C Palliative Medicine    Please contact Palliative Medicine Team phone at [phone] for questions and concerns.  For individual provider: See Shea Evans

## 2018-08-10 NOTE — Progress Notes (Signed)
Pt arrived to [facility], daughter at the bedside. Pt identified appropriately. Alert and oriented to self only. Daughter answered questions about pt history. According to her pt leaves with his son, and was admitted for increased confusion and weakness. Vital signs stable, pt afebrile, denied any pain and discomfort.  Cardiac monitor placed and CCMD notified. Continuous pulse ox in place and contact isolation initiated. Pt family instructed to call for assistance, and call bell left within reach. Bed alarm in place. Will continue to monitor and treat pt per MD orders.

## 2018-08-11 DIAGNOSIS — A408 Other streptococcal sepsis: Secondary | ICD-10-CM

## 2018-08-11 MED ORDER — AMOXICILLIN-POT CLAVULANATE 400-57 MG/5ML PO SUSR
875.0000 mg | Freq: Two times a day (BID) | ORAL | Status: DC
  Filled 2018-08-11: qty 10.9

## 2018-08-11 MED ORDER — SODIUM CHLORIDE 0.9 % IV SOLN
3.0000 g | Freq: Three times a day (TID) | INTRAVENOUS | Status: AC
  Administered 2018-08-11 – 2018-08-12 (×3): 3 g via INTRAVENOUS
  Filled 2018-08-11 (×3): qty 3

## 2018-08-11 MED ORDER — AMOXICILLIN-POT CLAVULANATE 600-42.9 MG/5ML PO SUSR
875.0000 mg | Freq: Two times a day (BID) | ORAL | Status: DC
  Administered 2018-08-12: 875 mg via ORAL
  Filled 2018-08-11 (×2): qty 7.3

## 2018-08-11 NOTE — Plan of Care (Signed)
Patient is making progress toward discharge, lungs sounds are diminished, no adventitious sound noted. Continues on antibiotics therapy.  Will continue to monitor.

## 2018-08-11 NOTE — Progress Notes (Signed)
  Speech Language Pathology Treatment: Dysphagia  Patient Details Name: Anthony Fisher MRN: [id number] DOB: September 01, 1926 Today's Date: 08/11/2018 Time: 5859-2924 SLP Time Calculation (min) (ACUTE ONLY): 18 min  Assessment / Plan / Recommendation Clinical Impression  Pt seen at bedside to assess diet tolerance and provide education. Pt's son present, and reports pt ate well at breakfast. Pt was provided with trials of thin liquid via straw, and puree. No overt s/s aspiration observed on any presentation. Mild oral residue of puree was noted, however, this cleared with liquid wash. Pt/son were encouraged to remain at 45 degrees for 30 minutes following po intake to facilitate oropharyngeal and esophageal clearing. Recommend continuing with pureed solids at this time. Will continue to follow for appropriateness for advanced solids.    HPI HPI: Pt is a 84 y.o. male who presents with AMS, sepsis secondary to RLL PNA. Medical history significant for CVA with chronic right hemiparesis, bedbound, nonambulatory, moderate to severe dementia, swallow dysfunction, hypertension, and diastolic heart failure.  Patient had a recent hospitalization, August 7 to July 16, 2018, when he was treated for sepsis secondary to UTI and aspiration pneumonia.  He was seen by SLP and discharged with dysphagia 1 pure diet, thin liquids, and aspiration precautions. Most recent MBS 04/2017 recommended Dys 2 diet and thin liquids without straws. Aspiration on that study was sensed with a cough response elicited, but not one strong enough to clear the airway.       SLP Plan  Continue with current plan of care       Recommendations  Diet recommendations: Dysphagia 1 (puree);Thin liquid Liquids provided via: Straw;Cup Medication Administration: Crushed with puree Supervision: Full supervision/cueing for compensatory strategies;Staff to assist with self feeding Compensations: Slow rate;Small sips/bites;Follow solids with  liquid;Minimize environmental distractions Postural Changes and/or Swallow Maneuvers: Seated upright 90 degrees;Upright 30-60 min after meal                Oral Care Recommendations: Oral care BID Follow up Recommendations: 24 hour supervision/assistance SLP Visit Diagnosis: Dysphagia, unspecified (R13.10) Plan: Continue with current plan of care       GO              Roth Ress B. [facility], CCC-SLP Speech Language Pathologist [phone]  Shonna Chock 08/11/2018, 10:38 AM

## 2018-08-11 NOTE — Progress Notes (Signed)
No charge note:   I have called patient's daughter twice (once yesterday after speaking with patient's son at bedside - see 9/4 note, and once today) in attempts to arrange [place] meeting. Awaiting return call.  Mariana Kaufman, AGNP-C Palliative Medicine  Please call Palliative Medicine team phone with any questions [phone]. For individual providers please see [facility].

## 2018-08-11 NOTE — Progress Notes (Signed)
[facility] PROGRESS NOTE  Anthony Fisher UMP:[id number] DOB: [birth year] DOA: 08/09/2018   PCP: Lorene Dy, MD     Assessment/Plan: Active Problems:   Sepsis ([place])   HCAP (healthcare-associated pneumonia)   Palliative care by specialist   Goals of care, counseling/discussion    83 y.o. male with medical history significant of CVA with chronic right hemiparesis, bedbound, nonambulatory, moderate to severe dementia, swallow dysfunction, hypertension, and diastolic heart failure.  Patient had a recent hospitalization, August 7 to July 16, 2018, when he was treated sepsis secondary to urinary tract infection and aspiration pneumonia.  Now admitted for the same diagnosis   Assessment/Plan 1.  Sepsis due to right lower lobe aspiration pneumonia/ESBL ecoli .  Present on admission. Continue  telemetry monitor, aspiration precautions,patient seen by speech therapy and has been started on dysphagia 1 diet with thin liquids, patient has been receiving Unasyn,we'll continue for another 24 hours and switch to by mouth Augmentin Currently on room air, core dilator therapy with DuoNeb.     2.  Pyuria.hx of ESBL e coli  Present on admission.  Patient recently treated for E. coli urinary tract infection, will follow-up cultures. Continue Unasyn and then Augmentin tomorrow.    3. Dementia with acute metabolic encephalopathy.  Neuro checks every 4 hours, aspiration precautions, supportive medical therapy, IV fluids and IV antibiotics.  4.  History of CVA with right-sided hemiparesis.  Patient is nonambulatory, very poor prognosis. High risk for recurrent aspiration     DVT prophylaxsis lovenox   Code Status:  DNR    Family Communication: Discussed in detail with the patient, all imaging results, lab results explained to the patient   Disposition Plan:  Goals of care meeting pending     Consultants: Palliative care  Procedures:  None   Antibiotics: Anti-infectives  (From admission, onward)   Start     Dose/Rate Route Frequency Ordered Stop   08/11/18 1400  Ampicillin-Sulbactam (UNASYN) 3 g in sodium chloride 0.9 % 100 mL IVPB     3 g 200 mL/hr over 30 Minutes Intravenous Every 8 hours 08/11/18 1214 08/12/18 1359   08/11/18 1215  amoxicillin-clavulanate (AUGMENTIN) 400-57 MG/5ML suspension 400 mg     400 mg Oral Every 12 hours 08/11/18 1214     08/10/18 0800  vancomycin (VANCOCIN) IVPB 750 mg/150 ml premix  Status:  Discontinued     750 mg 150 mL/hr over 60 Minutes Intravenous Every 12 hours 08/09/18 2103 08/09/18 2308   08/10/18 0730  ceFEPIme (MAXIPIME) 2 g in sodium chloride 0.9 % 100 mL IVPB  Status:  Discontinued     2 g 200 mL/hr over 30 Minutes Intravenous Every 12 hours 08/09/18 2103 08/09/18 2308   08/10/18 0600  Ampicillin-Sulbactam (UNASYN) 3 g in sodium chloride 0.9 % 100 mL IVPB  Status:  Discontinued     3 g 200 mL/hr over 30 Minutes Intravenous Every 8 hours 08/09/18 2307 08/11/18 1214   08/09/18 2000  vancomycin (VANCOCIN) 1,500 mg in sodium chloride 0.9 % 500 mL IVPB  Status:  Discontinued     1,500 mg 250 mL/hr over 120 Minutes Intravenous  Once 08/09/18 1958 08/09/18 2308   08/09/18 1930  ceFEPIme (MAXIPIME) 2 g in sodium chloride 0.9 % 100 mL IVPB     2 g 200 mL/hr over 30 Minutes Intravenous  Once 08/09/18 1927 08/09/18 2106   08/09/18 1930  metroNIDAZOLE (FLAGYL) IVPB 500 mg  Status:  Discontinued  500 mg 100 mL/hr over 60 Minutes Intravenous Every 8 hours 08/09/18 1927 08/09/18 2308   08/09/18 1930  vancomycin (VANCOCIN) IVPB 1000 mg/200 mL premix  Status:  Discontinued     1,000 mg 200 mL/hr over 60 Minutes Intravenous  Once 08/09/18 1927 08/09/18 1958         HPI/Subjective:  patient is much more alert and conversive, afebrile overnight, denies any shortness of breath nausea vomiting  Objective: Vitals:   08/10/18 0643 08/10/18 1245 08/10/18 2315 08/11/18 0633  BP: 129/63 (!) 142/52 (!) 123/53 136/73  Pulse:  71 (!) 57 63 (!) 58  Resp: 18 18 17 17   Temp: 97.8 F (36.6 C) 98.6 F (37 C) 98.1 F (36.7 C) 97.9 F (36.6 C)  TempSrc:   Axillary Oral  SpO2: 99% 100% 98% 97%  Weight:      Height: 6' (1.829 m)       Intake/Output Summary (Last 24 hours) at 08/11/2018 1217 Last data filed at 08/11/2018 0700 Gross per 24 hour  Intake 2402.51 ml  Output 2100 ml  Net 302.51 ml    Exam:  Examination:  General exam: more arousable and awake  Respiratory system: Clear to auscultation. Respiratory effort normal. Cardiovascular system: S1 & S2 heard, RRR. No JVD, murmurs, rubs, gallops or clicks. No pedal edema. Gastrointestinal system: Abdomen is nondistended, soft and nontender. No organomegaly or masses felt. Normal bowel sounds heard. Central nervous system:  Awake, coherent Extremities: Symmetric 5 x 5 power. Skin: No rashes, lesions or ulcers      Data Reviewed: I have personally reviewed following labs and imaging studies  Micro Results Recent Results (from the past 240 hour(s))  Culture, blood (Routine x 2)     Status: None (Preliminary result)   Collection Time: 08/09/18  8:00 PM  Result Value Ref Range Status   Specimen Description BLOOD RIGHT HAND  Final   Special Requests   Final    BOTTLES DRAWN AEROBIC AND ANAEROBIC Blood Culture adequate volume   Culture   Final    NO GROWTH 2 DAYS Performed at [facility] [address] [place], [place] [address]    Report Status PENDING  Incomplete  Culture, blood (Routine x 2)     Status: None (Preliminary result)   Collection Time: 08/09/18 10:39 PM  Result Value Ref Range Status   Specimen Description BLOOD BLOOD RIGHT FOREARM  Final   Special Requests   Final    BOTTLES DRAWN AEROBIC AND ANAEROBIC Blood Culture adequate volume   Culture   Final    NO GROWTH 2 DAYS Performed at [facility], [place] [address], [place], [place] [address]    Report Status PENDING  Incomplete    Radiology Reports Dg Chest 2  View  Result Date: 07/13/2018 CLINICAL DATA:  Weakness. EXAM: CHEST - 2 VIEW COMPARISON:  05/17/2018. FINDINGS: The cardiac silhouette remains borderline enlarged. The aorta remains tortuous and possibly diffusely enlarged. The interstitial markings remain mildly prominent in the lungs are clear. Normal vascularity. Diffuse osteopenia. IMPRESSION: 1. No acute abnormality. 2. Stable borderline cardiomegaly, tortuous and possibly diffusely enlarged thoracic aorta and mild chronic interstitial lung disease. Electronically Signed   By: Claudie Revering M.D.   On: 07/13/2018 19:51   Dg Chest Port 1 View  Result Date: 08/09/2018 CLINICAL DATA:  Lethargy EXAM: PORTABLE CHEST 1 VIEW COMPARISON:  07/13/2018, 05/17/2018, 02/24/2018, 02/16/2017 FINDINGS: Airspace disease at the right base. No pleural effusion. Stable cardiomegaly. Enlarged mediastinal  silhouette, likely augmented by mild rotation. No pneumothorax IMPRESSION: 1. Focal opacity at the right base may reflect a pneumonia. Radiographic follow-up is recommended. 2. Enlarged mediastinal silhouette, likely due to combination of tortuous enlarged aorta and patient rotation. Mild cardiomegaly. Electronically Signed   By: Donavan Foil M.D.   On: 08/09/2018 20:15     CBC Recent Labs  Lab 08/09/18 1938 08/10/18 0421  WBC 14.4* 10.7*  HGB 12.9* 10.8*  HCT 38.3* 32.2*  PLT 252 195  MCV 98.5 98.5  MCH 33.2 33.0  MCHC 33.7 33.5  RDW 13.5 13.4  LYMPHSABS 1.6  --   MONOABS 1.0  --   EOSABS 0.1  --   BASOSABS 0.1  --     Chemistries  Recent Labs  Lab 08/09/18 1938 08/10/18 0421  NA 139 138  K 4.1 3.6  CL 102 107  CO2 25 24  GLUCOSE 132* 121*  BUN 16 14  CREATININE 1.04 0.84  CALCIUM 9.4 8.6*  AST 18  --   ALT 11  --   ALKPHOS 74  --   BILITOT 0.9  --    ------------------------------------------------------------------------------------------------------------------ estimated creatinine clearance is 64.2 mL/min (by C-G formula based on SCr  of 0.84 mg/dL). ------------------------------------------------------------------------------------------------------------------ No results for input(s): HGBA1C in the last 72 hours. ------------------------------------------------------------------------------------------------------------------ No results for input(s): CHOL, HDL, LDLCALC, TRIG, CHOLHDL, LDLDIRECT in the last 72 hours. ------------------------------------------------------------------------------------------------------------------ No results for input(s): TSH, T4TOTAL, T3FREE, THYROIDAB in the last 72 hours.  Invalid input(s): FREET3 ------------------------------------------------------------------------------------------------------------------ No results for input(s): VITAMINB12, FOLATE, FERRITIN, TIBC, IRON, RETICCTPCT in the last 72 hours.  Coagulation profile Recent Labs  Lab 08/09/18 1938  INR 1.14    No results for input(s): DDIMER in the last 72 hours.  Cardiac Enzymes No results for input(s): CKMB, TROPONINI, MYOGLOBIN in the last 168 hours.  Invalid input(s): CK ------------------------------------------------------------------------------------------------------------------ Invalid input(s): POCBNP   CBG: No results for input(s): GLUCAP in the last 168 hours.     Studies: Dg Chest Port 1 View  Result Date: 08/09/2018 CLINICAL DATA:  Lethargy EXAM: PORTABLE CHEST 1 VIEW COMPARISON:  07/13/2018, 05/17/2018, 02/24/2018, 02/16/2017 FINDINGS: Airspace disease at the right base. No pleural effusion. Stable cardiomegaly. Enlarged mediastinal silhouette, likely augmented by mild rotation. No pneumothorax IMPRESSION: 1. Focal opacity at the right base may reflect a pneumonia. Radiographic follow-up is recommended. 2. Enlarged mediastinal silhouette, likely due to combination of tortuous enlarged aorta and patient rotation. Mild cardiomegaly. Electronically Signed   By: Donavan Foil M.D.   On: 08/09/2018  20:15      Lab Results  Component Value Date   HGBA1C 5.5 02/16/2017   Lab Results  Component Value Date   LDLCALC 60 05/20/2018   CREATININE 0.84 08/10/2018       Scheduled Meds: . amoxicillin-clavulanate  400 mg Oral Q12H  . enoxaparin (LOVENOX) injection  40 mg Subcutaneous Q24H  . tamsulosin  0.4 mg Oral Daily   Continuous Infusions: . sodium chloride 250 mL (08/10/18 0658)  . ampicillin-sulbactam (UNASYN) IV    . dextrose 5 % and 0.9% NaCl 75 mL/hr at 08/11/18 0530     LOS: 2 days    Time spent: >30 MINS    Reyne Dumas  [facility] Pager [phone]. If 7PM-7AM, please contact night-coverage at [email or web address], password [facility] 08/11/2018, 12:17 PM  LOS: 2 days

## 2018-08-11 NOTE — Progress Notes (Signed)
Pt retaining urine, bladder scan performed 586ml obtained in bladder scan, 581ml urine obtained by in/out catherization. 16 fr catheter was used,  Patient tolerated the procedure well.

## 2018-08-12 DIAGNOSIS — A021 Salmonella sepsis: Secondary | ICD-10-CM

## 2018-08-12 LAB — BASIC METABOLIC PANEL
Anion gap: 6 (ref 5–15)
BUN: 13 mg/dL (ref 8–23)
CO2: 25 mmol/L (ref 22–32)
CREATININE: 0.72 mg/dL (ref 0.61–1.24)
Calcium: 8.3 mg/dL — ABNORMAL LOW (ref 8.9–10.3)
Chloride: 109 mmol/L (ref 98–111)
GFR calc Af Amer: >60 mL/min (ref >60)
GLUCOSE: 110 mg/dL — AB (ref 70–99)
Potassium: 3.6 mmol/L (ref 3.5–5.1)
SODIUM: 140 mmol/L (ref 135–145)

## 2018-08-12 LAB — CBC
HCT: 29.8 % — ABNORMAL LOW (ref 39.0–52.0)
Hemoglobin: 9.8 g/dL — ABNORMAL LOW (ref 13.0–17.0)
MCH: 32.6 pg (ref 26.0–34.0)
MCHC: 32.9 g/dL (ref 30.0–36.0)
MCV: 99 fL (ref 78.0–100.0)
PLATELETS: 175 10*3/uL (ref 150–400)
RBC: 3.01 MIL/uL — ABNORMAL LOW (ref 4.22–5.81)
RDW: 13.6 % (ref 11.5–15.5)
WBC: 5.1 10*3/uL (ref 4.0–10.5)

## 2018-08-12 MED ORDER — TAMSULOSIN HCL 0.4 MG PO CAPS: 0.4000 mg | ORAL_CAPSULE | Freq: Every day | ORAL | 3 refills | Status: AC

## 2018-08-12 MED ORDER — AMOXICILLIN-POT CLAVULANATE 600-42.9 MG/5ML PO SUSR: 875.0000 mg | Freq: Two times a day (BID) | ORAL | 0 refills | Status: AC

## 2018-08-12 MED ORDER — IPRATROPIUM-ALBUTEROL 0.5-2.5 (3) MG/3ML IN SOLN: 3.0000 mL | RESPIRATORY_TRACT | 0 refills | Status: AC | PRN

## 2018-08-12 NOTE — Evaluation (Signed)
Physical Therapy Evaluation Patient Details Name: Anthony Fisher MRN: [id number] DOB: [birth year] Today's Date: 08/12/2018   History of Present Illness  Pt is a 84 y/o male admitted secondary to worsening AMS. Thought to be secondary to sepsis secondary to R lower lobe PNA. PMH includes dementia and CVA with L sided deficits.   Clinical Impression  Pt admitted secondary to problem above with deficits below. Pt currently requiring max A for basic bed mobility and assist with standing. Pt with heavy posterior lean in standing. Feel pt would benefit from SNF at d/c, however, pt's daughter currently refusing. Educated about need for lift equipment given pt's current deficits. Will need max HH services as well. Will continue to follow acutely to maximize functional mobility independence and safety.     Follow Up Recommendations Home health PT;Supervision/Assistance - 24 hour(max HH services; pt's daughter refusing SNF )    Equipment Recommendations  Other (comment)(hoyer lift; hoyer lift pad )    Recommendations for Other Services       Precautions / Restrictions Precautions Precautions: Fall Restrictions Weight Bearing Restrictions: No      Mobility  Bed Mobility Overal bed mobility: Needs Assistance Bed Mobility: Supine to Sit;Sit to Supine     Supine to sit: Max assist Sit to supine: Max assist;+2 for physical assistance   General bed mobility comments: MAx A for LE assist and trunk elevation. Multimodal cues required for sequencing. Daughter assisted with transfer back to supine and required max A for trunk descent and LE assist.   Transfers Overall transfer level: Needs assistance Equipment used: 1 person hand held assist Transfers: Sit to/from Stand Sit to Stand: Max assist         General transfer comment: Pt requiring max A for lift assist and steadying. Pt requiring manual assist for propper foot placement, as pt tended to cross LLE over RLE. PT stood in front of pt  to assist with transfer. Pt with heavy posterior lean so further mobility deferred.   Ambulation/Gait             General Gait Details: unable   Stairs            Wheelchair Mobility    Modified Rankin (Stroke Patients Only) Modified Rankin (Stroke Patients Only) Pre-Morbid Rankin Score: Moderately severe disability Modified Rankin: Severe disability     Balance Overall balance assessment: Needs assistance Sitting-balance support: Single extremity supported;Feet supported Sitting balance-Leahy Scale: Poor Sitting balance - Comments: Reliant on UE and min guard to min A for sitting balance Postural control: Posterior lean Standing balance support: Bilateral upper extremity supported;During functional activity Standing balance-Leahy Scale: Poor Standing balance comment: Reliant on UE and external support secondary to posterior lean.                              Pertinent Vitals/Pain Pain Assessment: Faces Faces Pain Scale: No hurt    Home Living Family/patient expects to be discharged to:: Private residence Living Arrangements: Children Available Help at Discharge: Family;Personal care attendant;Available 24 hours/day Type of Home: House Home Access: Ramped entrance     Home Layout: One level Home Equipment: Walker - 2 wheels;Hospital bed;Bedside commode;Wheelchair - manual;Other (comment)(gait belt )      Prior Function Level of Independence: Needs assistance   Gait / Transfers Assistance Needed: Reports in the last 3 weeks has been nonambulatory and required assist to transfer to [facility]. Per daughter, pt used RW to  transfer to [facility] and pt was able to assist some, but had been having increased difficulty.   ADL's / Homemaking Assistance Needed: depedent - family assists        Hand Dominance   Dominant Hand: Right    Extremity/Trunk Assessment   Upper Extremity Assessment Upper Extremity Assessment: Defer to OT evaluation    Lower  Extremity Assessment Lower Extremity Assessment: LLE deficits/detail LLE Deficits / Details: LLE weakness at baseline. Grossly 2/5 throughout LLE.     Cervical / Trunk Assessment Cervical / Trunk Assessment: Kyphotic  Communication   Communication: Expressive difficulties  Cognition Arousal/Alertness: Awake/alert Behavior During Therapy: WFL for tasks assessed/performed Overall Cognitive Status: History of cognitive impairments - at baseline                                 General Comments: Per pt's daughter, pt close to baseline cognitively. Pt with history of dementia and CVA.       General Comments General comments (skin integrity, edema, etc.): Pt's daughter present during session. Pt's daughter currently refusing SNF. Educated about need for lift equipment.     Exercises     Assessment/Plan    PT Assessment Patient needs continued PT services  PT Problem List Decreased strength;Decreased balance;Decreased mobility;Decreased knowledge of use of DME;Decreased cognition;Decreased safety awareness;Decreased knowledge of precautions       PT Treatment Interventions DME instruction;Gait training;Functional mobility training;Therapeutic activities;Therapeutic exercise;Balance training;Wheelchair mobility training;Cognitive remediation;Patient/family education    PT Goals (Current goals can be found in the Care Plan section)  Acute Rehab PT Goals Patient Stated Goal: to take pt back home per daughter PT Goal Formulation: With family Time For Goal Achievement: 08/26/18 Potential to Achieve Goals: Good    Frequency Min 3X/week   Barriers to discharge        Co-evaluation               AM-PAC PT "6 Clicks" Daily Activity  Outcome Measure Difficulty turning over in bed (including adjusting bedclothes, sheets and blankets)?: Unable Difficulty moving from lying on back to sitting on the side of the bed? : Unable Difficulty sitting down on and standing up  from a chair with arms (e.g., wheelchair, bedside commode, etc,.)?: Unable Help needed moving to and from a bed to chair (including a wheelchair)?: Total Help needed walking in hospital room?: Total Help needed climbing 3-5 steps with a railing? : Total 6 Click Score: 6    End of Session Equipment Utilized During Treatment: Gait belt Activity Tolerance: Patient tolerated treatment well Patient left: in bed;with call bell/phone within reach;with bed alarm set Nurse Communication: Mobility status PT Visit Diagnosis: Unsteadiness on feet (R26.81);Muscle weakness (generalized) (M62.81);Difficulty in walking, not elsewhere classified (R26.2)    Time: 1040-1105 PT Time Calculation (min) (ACUTE ONLY): 25 min   Charges:   PT Evaluation $PT Eval Moderate Complexity: 1 Mod PT Treatments $Therapeutic Activity: 8-22 mins        Leighton Ruff, PT, DPT  Acute Rehabilitation Services  Pager: [phone] Office: [phone]   Rudean Hitt 08/12/2018, 11:17 AM

## 2018-08-12 NOTE — Progress Notes (Signed)
[facility] Liaison note.  Notified by Whitman Hero, RNCM that patient was discharging home and that family has requested hospice support in the home. Spoke with patient's daughter by phone to explain services and answer questions. No DME is needed at this time.   Information called to [facility] referral center for scheduling of an assessment visit.   Whitman Hero [facility] aware of the above.  Please call with any hospice related questions or concerns.  Thank you,  Charlett Blake, [facility] Liaison  [phone]  [facility] hospital liaisons are on [place]

## 2018-08-12 NOTE — Progress Notes (Signed)
No charge note:   Spoke with patient's daughter [name]- she and her brother have agreed that they would like to take patient home with Hospice services.   Consult placed for care manager referral to arrange Hospice services in the home.   Mariana Kaufman, AGNP-C Palliative Medicine  Please call Palliative Medicine team phone with any questions [phone]. For individual providers please see AMION.

## 2018-08-12 NOTE — Discharge Summary (Signed)
Physician Discharge Summary  Anthony Fisher MRN: [id number] DOB/AGE: [birth year] 84 y.o.  PCP: Lorene Dy, MD   Admit date: 08/09/2018 Discharge date: 08/12/2018  Discharge Diagnoses:    Active Problems:   Sepsis ([place])   HCAP (healthcare-associated pneumonia)   Palliative care by specialist   Goals of care, counseling/discussion    Follow-up recommendations Follow-up with PCP in 3-5 days , including all  additional recommended appointments as below Follow-up CBC, CMP in 3-5 days Strict aspiration precautions Patient discharged with home health and family    Diet Recommendation Dysphagia 1 (Puree);Thin liquid   Liquid Administration via: Cup;No straw Medication Administration: Crushed with puree Supervision: Staff to assist with self feeding;Full supervision/cueing for compensatory strategies Compensations: Slow rate;Small sips/bites;Follow solids with liquid Postural Changes: Seated upright at 90 degrees    Other  Recommendations Oral Care Recommendations: Oral care BID   Follow up Recommendations 24 hour supervision/assistance       Allergies as of 08/12/2018      Reactions   Tape Other (See Comments)   SKIN IS THIN (MAY TEAR AND/OR BRUISE EASILY)      Medication List    TAKE these medications   amoxicillin-clavulanate 600-42.9 MG/5ML suspension Commonly known as:  AUGMENTIN Take 7.3 mLs (875 mg total) by mouth every 12 (twelve) hours for 7 days.   aspirin EC 81 MG tablet Take 1 tablet (81 mg total) by mouth daily.   ipratropium-albuterol 0.5-2.5 (3) MG/3ML Soln Commonly known as:  DUONEB Take 3 mLs by nebulization every 2 (two) hours as needed.   tamsulosin 0.4 MG Caps capsule Commonly known as:  FLOMAX Take 1 capsule (0.4 mg total) by mouth daily. Start taking on:  08/13/2018            Durable Medical Equipment  (From admission, onward)         Start     Ordered   08/12/18 1117  For home use only DME Nebulizer/meds  Once    Question:   Patient needs a nebulizer to treat with the following condition  Answer:  Aspiration pneumonia [facility])   08/12/18 1116           Discharge Condition:    Discharge Instructions Get Medicines reviewed and adjusted: Please take all your medications with you for your next visit with your Primary MD  Please request your Primary MD to go over all hospital tests and procedure/radiological results at the follow up, please ask your Primary MD to get all Hospital records sent to his/her office.  If you experience worsening of your admission symptoms, develop shortness of breath, life threatening emergency, suicidal or homicidal thoughts you must seek medical attention immediately by calling 911 or calling your MD immediately  if symptoms less severe.  You must read complete instructions/literature along with all the possible adverse reactions/side effects for all the Medicines you take and that have been prescribed to you. Take any new Medicines after you have completely understood and accpet all the possible adverse reactions/side effects.   Do not drive when taking Pain medications.   Do not take more than prescribed Pain, Sleep and Anxiety Medications  Special Instructions: If you have smoked or chewed Tobacco  in the last 2 yrs please stop smoking, stop any regular Alcohol  and or any Recreational drug use.  Wear Seat belts while driving.  Please note  You were cared for by a hospitalist during your hospital stay. Once you are discharged, your primary care physician will handle  any further medical issues. Please note that NO REFILLS for any discharge medications will be authorized once you are discharged, as it is imperative that you return to your primary care physician (or establish a relationship with a primary care physician if you do not have one) for your aftercare needs so that they can reassess your need for medications and monitor your lab values.     Allergies  Allergen Reactions   . Tape Other (See Comments)    SKIN IS THIN (MAY TEAR AND/OR BRUISE EASILY)      Disposition: Discharge disposition: 01-Home or Self Care        Consults:none     Significant Diagnostic Studies:  Dg Chest 2 View  Result Date: 07/13/2018 CLINICAL DATA:  Weakness. EXAM: CHEST - 2 VIEW COMPARISON:  05/17/2018. FINDINGS: The cardiac silhouette remains borderline enlarged. The aorta remains tortuous and possibly diffusely enlarged. The interstitial markings remain mildly prominent in the lungs are clear. Normal vascularity. Diffuse osteopenia. IMPRESSION: 1. No acute abnormality. 2. Stable borderline cardiomegaly, tortuous and possibly diffusely enlarged thoracic aorta and mild chronic interstitial lung disease. Electronically Signed   By: Claudie Revering M.D.   On: 07/13/2018 19:51   Dg Chest Port 1 View  Result Date: 08/09/2018 CLINICAL DATA:  Lethargy EXAM: PORTABLE CHEST 1 VIEW COMPARISON:  07/13/2018, 05/17/2018, 02/24/2018, 02/16/2017 FINDINGS: Airspace disease at the right base. No pleural effusion. Stable cardiomegaly. Enlarged mediastinal silhouette, likely augmented by mild rotation. No pneumothorax IMPRESSION: 1. Focal opacity at the right base may reflect a pneumonia. Radiographic follow-up is recommended. 2. Enlarged mediastinal silhouette, likely due to combination of tortuous enlarged aorta and patient rotation. Mild cardiomegaly. Electronically Signed   By: Donavan Foil M.D.   On: 08/09/2018 20:15      Filed Weights   08/09/18 1922  Weight: 79.4 kg     Microbiology: Recent Results (from the past 240 hour(s))  Culture, blood (Routine x 2)     Status: None (Preliminary result)   Collection Time: 08/09/18  8:00 PM  Result Value Ref Range Status   Specimen Description BLOOD RIGHT HAND  Final   Special Requests   Final    BOTTLES DRAWN AEROBIC AND ANAEROBIC Blood Culture adequate volume   Culture   Final    NO GROWTH 2 DAYS Performed at [facility], [place] [address] [place], [place] [address]    Report Status PENDING  Incomplete  Culture, blood (Routine x 2)     Status: None (Preliminary result)   Collection Time: 08/09/18 10:39 PM  Result Value Ref Range Status   Specimen Description BLOOD BLOOD RIGHT FOREARM  Final   Special Requests   Final    BOTTLES DRAWN AEROBIC AND ANAEROBIC Blood Culture adequate volume   Culture   Final    NO GROWTH 2 DAYS Performed at [facility], [place] [address] [place], [place] [address]    Report Status PENDING  Incomplete       Blood Culture    Component Value Date/Time   SDES BLOOD BLOOD RIGHT FOREARM 08/09/2018 2239   SPECREQUEST  08/09/2018 2239    BOTTLES DRAWN AEROBIC AND ANAEROBIC Blood Culture adequate volume   CULT  08/09/2018 2239    NO GROWTH 2 DAYS Performed at [facility], [place] [address] [place], [place] [address]    REPTSTATUS PENDING 08/09/2018 2239      Labs: Results for orders placed or performed during the hospital encounter of 08/09/18 (  from the past 48 hour(s))  CBC     Status: Abnormal   Collection Time: 08/12/18  5:06 AM  Result Value Ref Range   WBC 5.1 4.0 - 10.5 K/uL   RBC 3.01 (L) 4.22 - 5.81 MIL/uL   Hemoglobin 9.8 (L) 13.0 - 17.0 g/dL   HCT 29.8 (L) 39.0 - 52.0 %   MCV 99.0 78.0 - 100.0 fL   MCH 32.6 26.0 - 34.0 pg   MCHC 32.9 30.0 - 36.0 g/dL   RDW 13.6 11.5 - 15.5 %   Platelets 175 150 - 400 K/uL    Comment: Performed at [facility], [place] [address], [place], [place] [address]  Basic metabolic panel     Status: Abnormal   Collection Time: 08/12/18  5:06 AM  Result Value Ref Range   Sodium 140 135 - 145 mmol/L   Potassium 3.6 3.5 - 5.1 mmol/L   Chloride 109 98 - 111 mmol/L   CO2 25 22 - 32 mmol/L   Glucose, Bld 110 (H) 70 - 99 mg/dL   BUN 13 8 - 23 mg/dL   Creatinine, Ser 0.72 0.61 - 1.24 mg/dL   Calcium 8.3 (L) 8.9 - 10.3 mg/dL   GFR calc non Af Amer >60 >60 mL/min   GFR calc Af Amer >60 >60 mL/min    Comment: (NOTE) The eGFR has  been calculated using the CKD EPI equation. This calculation has not been validated in all clinical situations. eGFR's persistently <60 mL/min signify possible Chronic Kidney Disease.    Anion gap 6 5 - 15    Comment: Performed at [facility] [address], [place], [place] [address]     Lipid Panel     Component Value Date/Time   CHOL 145 05/20/2018 1101   TRIG 76 05/20/2018 1101   HDL 70 05/20/2018 1101   CHOLHDL 2.1 05/20/2018 1101   CHOLHDL 2.8 02/16/2017 0804   VLDL 14 02/16/2017 0804   LDLCALC 60 05/20/2018 1101     Lab Results  Component Value Date   HGBA1C 5.5 02/16/2017     Lab Results  Component Value Date   LDLCALC 60 05/20/2018   CREATININE 0.72 08/12/2018     HPI   84 y.o. Fisher who presents with AMS, sepsis secondary to RLL PNA. Medical history significant for CVA with chronic right hemiparesis, bedbound, nonambulatory, moderate to severe dementia, swallow dysfunction, hypertension, and diastolic heart failure.  Patient had a recent hospitalization, August 7 to July 16, 2018, when he was treated for sepsis secondary to UTI and aspiration pneumonia.  He was seen by SLP and discharged with dysphagia 1 pure diet, thin liquids, and aspiration precautions. Most recent MBS 04/2017 recommended Dys 2 diet and thin liquids without straws. Aspiration on that study was sensed with a cough response elicited, but not one strong enough to clear the airway.  Patient had a recent hospitalization, August 7 to July 16, 2018,when he was treated sepsis secondary to urinary tract infection and aspiration pneumonia.  Now admitted for the same diagnosis   HOSPITAL COURSE:  1.Sepsis due to right lower lobe aspiration pneumonia/ESBL ecoli . Present on admission. Monitored on  telemetry  Which was uneventful, aspiration precautions recommended as documented by SLP,patient seen by speech therapy and has been started on dysphagia 1 diet with thin liquids, patient has been  receiving Unasyn,which was changed to  liquid Augmentin 7 days Currently on room air, continue when necessary DuoNeb .  2.Pyuria.hx of ESBL e coliPresent on admission. Patient recently treated for E. coli urinary tract infection, will follow-up cultures. initially treated with Unasyn and then Augmentin    3.Dementia with acute metabolic encephalopathy. Neuro checks every 4 hours, aspiration precautions, supportive medical therapy, IV fluids and IV antibiotics completed. Family declined palliative care consult  4.History of CVA with right-sided hemiparesis. Patient is nonambulatory, very poor prognosis. High risk for recurrent aspiration , family not interested in having goals of care meeting at this time    Discharge Exam:  Blood pressure (!) 153/78, pulse 65, temperature 97.8 F (36.6 C), temperature source Oral, resp. rate 17, height 6' (1.829 m), weight 79.4 kg, SpO2 98 %.   General exam: more arousable and awake  Respiratory system: Clear to auscultation. Respiratory effort normal. Cardiovascular system: S1 & S2 heard, RRR. No JVD, murmurs, rubs, gallops or clicks. No pedal edema. Gastrointestinal system: Abdomen is nondistended, soft and nontender. No organomegaly or masses felt. Normal bowel sounds heard. Central nervous system:  Awake, coherent Extremities: Symmetric 5 x 5 power. Skin: No rashes, lesions or ulcers   Follow-up Information    Lorene Dy, MD. Call.   Specialty:  Internal Medicine Why:  in 3-5 days, hospital follow-up Contact information: [place], [place] [address] [phone]           Signed: Reyne Dumas 08/12/2018, 11:17 AM      Time needed to  prepare  discharge, discussed with the patient and family 35 minutes

## 2018-08-12 NOTE — Progress Notes (Signed)
Daily Progress Note   Patient Name: Anthony Fisher       Date: 08/12/2018 DOB: 06-17-1926  Age: 84 y.o. MRN#: [id number] Attending Physician: Reyne Dumas, MD Primary Care Physician: Lorene Dy, MD Admit Date: 08/09/2018  Reason for Consultation/Follow-up: Establishing goals of care  Subjective: I was called by patient's daughter- Anthony Fisher for consult.  Examined patient and observed PT consult. Patient not able to stand on his own. Did follow commands.  He could tell me his daughter's name, but not oriented to person or time. Able to tell me his favorite food- hot dog. Did not initiate conversation, only answered simple questions.  Anthony Fisher states she has seen her Dad decline significantly over the last several months. He is dependent for all ADL's, is incontinent of stool and bowel, must be hand fed. She recognizes that with each admission to the hospital he declines even further towards end of life.  We discussed what her wishes and what her Dad's wishes would be for EOL. She stated he would not wish to continue to live in this current state.  We discussed illness trajectory of patient with CVA, dysphagia and aspiration pneumonia with high risk and likelihood if aspiration pneumonia recurring in the near future.  Her GOC would be for her Dad to remain at home and for the focus of his care to be on comfort and quality of life rather than prolonging. We discussed that continuing to bring him to the hospital and provide IV fluids and IV antibiotics are considered life prolonging measures in his situation. She stated that she would prefer if he were to decline further at home, that he would remain at home and be comfortable.  Hospice services were offered and Anthony Fisher would like to proceed with   Hospice for her Dad, however, she needs to finalize this with her brother first.   Review of Systems  Unable to perform ROS: Dementia    Length of Stay: 3  Current Medications: Scheduled Meds:  . amoxicillin-clavulanate  875 mg Oral Q12H  . enoxaparin (LOVENOX) injection  40 mg Subcutaneous Q24H  . tamsulosin  0.4 mg Oral Daily    Continuous Infusions: . sodium chloride Stopped (08/11/18 2146)  . dextrose 5 % and 0.9% NaCl 75 mL/hr at 08/12/18 0400    PRN Meds: sodium chloride,  acetaminophen **OR** acetaminophen, ipratropium-albuterol, ondansetron **OR** ondansetron (ZOFRAN) IV  Physical Exam  Constitutional: He appears well-developed.  Frail, generalized weakness  Neurological:  Lethargic, oriented to person only  Psychiatric:  Flat affect  Nursing note and vitals reviewed.           Vital Signs: BP (!) 153/78 (BP Location: Left Arm)   Pulse 65   Temp 97.8 F (36.6 C) (Oral)   Resp 17   Ht 6' (1.829 m)   Wt 79.4 kg   SpO2 98%   BMI 23.73 kg/m  SpO2: SpO2: 98 % O2 Device: O2 Device: Room Air O2 Flow Rate:    Intake/output summary:   Intake/Output Summary (Last 24 hours) at 08/12/2018 1158 Last data filed at 08/12/2018 0400 Gross per 24 hour  Intake 2730.74 ml  Output 400 ml  Net 2330.74 ml   LBM: Last BM Date: 08/12/18(Small ) Baseline Weight: Weight: 79.4 kg Most recent weight: Weight: 79.4 kg       Palliative Assessment/Data: PPS:20%    Flowsheet Rows     Most Recent Value  Intake Tab  Referral Department  Hospitalist  Unit at Time of Referral  Intermediate Care Unit  Palliative Care Primary Diagnosis  Sepsis/Infectious Disease  Date Notified  08/10/18  Palliative Care Type  New Palliative care  Reason for referral  Clarify Goals of Care, Non-pain Symptom  Date of Admission  08/09/18  Date first seen by Palliative Care  08/10/18  # of days Palliative referral response time  0 Day(s)  # of days IP prior to Palliative referral  1  Clinical  Assessment  Psychosocial & Spiritual Assessment  Palliative Care Outcomes      Patient Active Problem List   Diagnosis Date Noted  . HCAP (healthcare-associated pneumonia)   . Palliative care by specialist   . Goals of care, counseling/discussion   . Acute lower UTI 07/15/2018  . Acute metabolic encephalopathy 27/25/3664  . Essential hypertension 07/13/2018  . Sepsis ([place]) 07/13/2018  . Chronic diastolic CHF (congestive heart failure) ([place]) 07/13/2018  . Aspiration pneumonia ([place]) 07/13/2018  . Bacterial UTI 04/27/2017  . CHF (congestive heart failure) ([place])   . Fall   . Fever   . Benign essential HTN   . Labile blood pressure   . Prerenal azotemia   . 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 IVH (intraventricular hemorrhage) ([place]) 02/23/2017  . Hypertensive emergency 02/23/2017  . Dysphagia due to recent stroke 02/23/2017  . Urinary retention 02/23/2017  . Dementia 02/23/2017  . Congestive heart failure ([place]) 02/23/2017  . Hypokalemia 02/23/2017  . Thalamic hemorrhage ([place]) 02/23/2017  . 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 02/16/2017    Palliative Care Assessment & Plan   Patient Profile: 84 y.o. male  with past medical history of dementia, CVA w/ R hemiparesis, bedbound, nonambulatory, dysphagia, HTN, diastolic heart failure (Grade 1, LVH according to 02/2017 ECHO) admitted on 08/09/2018 with increasing somnolence found to have sepsis due to RLL aspiration pneumonia, UTI. Recently hospitalized 8/7-8/10 for same diagnoses. He was treated and discharged home despite recommendations for SNF. Palliative medicine consulted for [place].  Assessment/Recommendations/Plan   Anthony Fisher is going to discuss Hospice with her brother and call me for referral before patient is discharged  Goals of Care and Additional Recommendations:  Limitations on Scope of Treatment: Avoid Hospitalization  Code  Status:  DNR  Prognosis:   < 6 months  Discharge Planning:  To Be  Determined  Care plan was discussed with patient's daughter- Anthony Fisher  Thank you for allowing the Palliative Medicine Team to assist in the care of this patient.   Time In: 1045 Time Out: 1200 Total Time 75 minutes Prolonged Time Billed Yes      Greater than 50%  of this time was spent counseling and coordinating care related to the above assessment and plan.  Mariana Kaufman, AGNP-C Palliative Medicine   Please contact Palliative Medicine Team phone at [phone] for questions and concerns.

## 2018-08-12 NOTE — Care Management Note (Addendum)
Case Management Note  Patient Details  Name: Anthony Fisher MRN: [id number] Date of Birth: Jul 07, 1926  Subjective/Objective:       Admitted with Sepsis. From home with son/family. PTA home health services were in place with [facility] daughter stated. DME: bed, wheelchair.     Anthony Fisher (Daughter) Anthony Fisher. (Son)    [phone]     PCP: Dicky Doe  Action/Plan: Transition to home with the resumption of home health services vs home with hospice care.   Anthony Fisher( daughter) to discuss with brother today prior to d/c if they are going to transition to Home hospice care for dad.  If pt d/c is to d/c to home with hospice care daughter has selected [facility] to provide home hospice services.  Per daughter pt has 24/7 assistance/supervision @ home.  Family to provide transportation to home.  Expected Discharge Date:  08/12/18               Expected Discharge Plan:  [place]  In-House Referral:     Discharge planning Services  CM Consult  Post Acute Care Choice:  Resumption of Svcs/PTA Provider, Home Health Choice offered to:  Adult Children  DME Arranged:  Nebulizer/meds DME Agency:  [facility] will be delivered to bedside prior to d/c.  HH Arranged:   RN,PT,OT [place] Agency:  [facility]  Status of Service:  In process  If discussed at Long Length of Stay Meetings, dates discussed:    Additional Comments:  Sharin Mons, RN 08/12/2018, 12:35 PM

## 2018-08-12 NOTE — Progress Notes (Signed)
Occupational Therapy Evaluation Patient Details Name: Anthony Fisher MRN: [id number] DOB: [birth year] Today's Date: 08/12/2018    History of Present Illness Pt is a 84 y/o male admitted secondary to worsening AMS. Thought to be secondary to sepsis secondary to R lower lobe PNA. PMH includes dementia and CVA with L sided deficits.    Clinical Impression   Pt is assisted at home by family and sitter. Family notes gradual decline in function over last week and has been +2 A with transfers and Max A with ADL. Pt with apparent MASD @ groin and buttocks - nsg notified. Note several satellite lesions which may indicate fungal in nature; daughter states it does not appear to be improving with use of just barrier cream. Educated daughter on strategies to reduce moisture and to protect skin integrity.     Follow Up Recommendations  Home health OT;Supervision/Assistance - 24 hour    Equipment Recommendations  Other (comment)(hoyer/hoyer pad)    Recommendations for Other Services       Precautions / Restrictions Precautions Precautions: Fall Restrictions Weight Bearing Restrictions: No      Mobility Bed Mobility Overal bed mobility: Needs Assistance Bed Mobility: Rolling Rolling: Max assist   Supine to sit: Max assist Sit to supine: Max assist;+2 for physical assistance   Transfers   Balance Overall balance assessment: Needs assistance Sitting-balance support: Single extremity supported;Feet supported Sitting balance-Leahy Scale: Poor Sitting balance - Comments: Reliant on UE and min guard to min A for sitting balance Postural control: Posterior lean Standing balance support: Bilateral upper extremity supported;During functional activity Standing balance-Leahy Scale: Poor Standing balance comment: Reliant on UE and external support secondary to posterior lean.                            ADL either performed or assessed with clinical judgement   ADL Overall ADL's :  Needs assistance/impaired                                     Functional mobility during ADLs: +2 for physical assistance;Maximal assistance General ADL Comments: Pt overall Max to total A for all ADL; Pt with apparent MADS - satellite lesions present - ?fungal; educated daughter on strategies to reduce moisture @ groin and buttocks; Discussed possibility of using condom catheter at night when volumn is high;frequently changing brief; Also discussed importance of maintaining skin interity by reducing pressure     Vision         Perception     Praxis      Pertinent Vitals/Pain Pain Assessment: Faces Faces Pain Scale: No hurt     Hand Dominance Right   Extremity/Trunk Assessment Upper Extremity Assessment Upper Extremity Assessment: Generalized weakness(LUE weaker than R due to CVA)   Lower Extremity Assessment Lower Extremity Assessment: Defer to PT evaluation LLE Deficits / Details: LLE weakness at baseline. Grossly 2/5 throughout LLE.    Cervical / Trunk Assessment Cervical / Trunk Assessment: Kyphotic   Communication Communication Communication: Expressive difficulties   Cognition Arousal/Alertness: Awake/alert Behavior During Therapy: WFL for tasks assessed/performed Overall Cognitive Status: History of cognitive impairments - at baseline                                 General Comments: Per pt's daughter, pt close to baseline cognitively.  Pt with history of dementia and CVA.    General Comments  Pt's daughter present during session. Pt's daughter currently refusing SNF. Educated about need for lift equipment.     Exercises Exercises: Other exercises Other Exercises Other Exercises: encouraged A/AA/PROM of all extremeties Other Exercises: educated on imporance of turning pt when in bed q 2 hrs   Shoulder Instructions      Home Living Family/patient expects to be discharged to:: Private residence Living Arrangements:  Children Available Help at Discharge: Family;Personal care attendant;Available 24 hours/day Type of Home: House Home Access: Ramped entrance     Home Layout: One level     Bathroom Shower/Tub: Other (comment)(sponge bathes )         Home Equipment: Walker - 2 wheels;Hospital bed;Bedside commode;Wheelchair - manual;Other (comment)(gait belt )          Prior Functioning/Environment Level of Independence: Needs assistance  Gait / Transfers Assistance Needed: Reports in the last 3 weeks has been nonambulatory and required assist to transfer to [facility]. Per daughter, pt used RW to transfer to [facility] and pt was able to assist some, but had been having increased difficulty.  ADL's / Homemaking Assistance Needed: depedent - family assists   Comments: family has recently started helping to feed pt        OT Problem List: Decreased strength;Decreased activity tolerance;Impaired balance (sitting and/or standing);Decreased cognition;Decreased safety awareness;Impaired UE functional use      OT Treatment/Interventions:      OT Goals(Current goals can be found in the care plan section) Acute Rehab OT Goals Patient Stated Goal: to take pt back home per daughter OT Goal Formulation: All assessment and education complete, DC therapy  OT Frequency:     Barriers to D/C:            Co-evaluation              AM-PAC PT "6 Clicks" Daily Activity     Outcome Measure Help from another person eating meals?: A Lot Help from another person taking care of personal grooming?: A Lot Help from another person toileting, which includes using toliet, bedpan, or urinal?: Total Help from another person bathing (including washing, rinsing, drying)?: A Lot Help from another person to put on and taking off regular upper body clothing?: A Lot Help from another person to put on and taking off regular lower body clothing?: Total 6 Click Score: 10   End of Session Nurse Communication: Other (comment)(MASD  ?fungal)  Activity Tolerance: Patient tolerated treatment well Patient left: in bed;with bed alarm set;with call bell/phone within reach;with family/visitor present  OT Visit Diagnosis: Other abnormalities of gait and mobility (R26.89);Muscle weakness (generalized) (M62.81);Other symptoms and signs involving cognitive function;Feeding difficulties (R63.3)                Time: 0786-7544 OT Time Calculation (min): 21 min Charges:  OT General Charges $OT Visit: 1 Visit OT Evaluation $OT Eval Moderate Complexity: [place], OT/L  OT Clinical Specialist [phone]   [facility] 08/12/2018, 12:22 PM

## 2018-08-12 NOTE — Discharge Instructions (Signed)
Diet Recommendation Dysphagia 1 (Puree);Thin liquid   Liquid Administration via: Cup;No straw Medication Administration: Crushed with puree Supervision: Staff to assist with self feeding;Full supervision/cueing for compensatory strategies Compensations: Slow rate;Small sips/bites;Follow solids with liquid Postural Changes: Seated upright at 90 degrees    Other  Recommendations Oral Care Recommendations: Oral care BID   Follow up Recommendations 24 hour supervision/assistance

## 2018-08-12 NOTE — Progress Notes (Signed)
Per Care manager referral was made to Hospice and Palliative care of [facility] [place] liaison to follow up with daughter [name].

## 2018-08-14 LAB — CULTURE, BLOOD (ROUTINE X 2)
CULTURE: NO GROWTH
CULTURE: NO GROWTH
Special Requests: ADEQUATE
Special Requests: ADEQUATE

## 2018-08-25 ENCOUNTER — Ambulatory Visit: Payer: Medicare Other | Admitting: Adult Health

## 2021-11-11 ENCOUNTER — Telehealth: Payer: Self-pay

## 2021-11-11 NOTE — Telephone Encounter (Signed)
Spoke with patient's daughter [name] and scheduled an in-person Palliative Consult for 12/03/21 2 12:30PM.  COVID screening was negative. Cat in the home. Patient lives with his son.    Consent obtained; updated Outlook/Netsmart/Team List and Epic.   Family is aware they may be receiving a call from provider the day before or day of to confirm appointment.

## 2021-12-03 ENCOUNTER — Other Ambulatory Visit: Payer: Self-pay

## 2021-12-03 ENCOUNTER — Other Ambulatory Visit: Payer: Medicare Other | Admitting: Hospice

## 2021-12-03 DIAGNOSIS — F0390 Unspecified dementia without behavioral disturbance: Secondary | ICD-10-CM

## 2021-12-03 DIAGNOSIS — R531 Weakness: Secondary | ICD-10-CM

## 2021-12-03 DIAGNOSIS — Z515 Encounter for palliative care: Secondary | ICD-10-CM

## 2021-12-03 DIAGNOSIS — R1310 Dysphagia, unspecified: Secondary | ICD-10-CM

## 2021-12-03 NOTE — Progress Notes (Signed)
[place] [facility] Consult Note Telephone: [phone]  Fax: [phone]  PATIENT NAME: Anthony Fisher Anthony Fisher [id number] [phone] (home)  DOB: 02-Jun-1926 MRN: [id number]  PRIMARY CARE PROVIDER:    Lorene Dy, MD,  [address] [place] [address] [phone]  REFERRING PROVIDER:   Dr Clovia Cuff [facility] [phone] [place]: Julie/Max  Max [phone] [place]: [phone] cell  Best number to call is Harrel Lemon Information     Name Relation Home Work Mobile   Esmond Camper Daughter [phone]     Owais, Pruett Son  [phone]   Damire, Remedios   [phone]        I met face to face with patient and family at home. Palliative Care was asked to follow this patient by consultation request of  Lorene Dy, MD to address advance care planning, complex medical decision making and goals of care clarification. Max and Regan are present with patient during visit. Patient was under hospice service and was not recertified about 2 weeks ago.  This is the initial visit.    ASSESSMENT AND / RECOMMENDATIONS:   Advance Care Planning: Our advance care planning conversation included a discussion about:    The value and importance of advance care planning  Difference between Hospice and Palliative care Exploration of goals of care in the event of a sudden injury or illness  Identification and preparation of a healthcare agent  Review and updating or creation of an  advance directive document . Decision not to resuscitate or to de-escalate disease focused treatments due to poor prognosis.  CODE STATUS: Patient is a Do Not Resuscitate.   Goals of Care: Goals include to maximize quality of life and symptom management. Patient/family are interested in hospice service when patient qualifies for it.   I spent 46 minutes providing  this initial consultation. More than 50% of the time in this consultation was spent on counseling patient and coordinating communication. --------------------------------------------------------------------------------------------------------------------------------------  Symptom Management/Plan: Dementia: FAST 7a, limited language, incontinent of bowel and bladder. No ambulatory, max assist for transfers. Continue ongoing supportive care Weakness: Non ambulatory, not able to stand.  Family not interested in PT/OT currently. Encourage range of motion exercises as tolerated. Fall precautions discussed.  Dysphagia: Related to CVA. Mechanical  soft diet encouraged, thin liquids. Choking precautions discussed.   Follow up: Palliative care will continue to follow for complex medical decision making, advance care planning, and clarification of goals. Return 6 weeks or prn. Encouraged to call provider sooner with any concerns.   Family /Caregiver/Community Supports: Patient lives at home with his son Max. [name] is involved in his care. Strong family support system identified  HOSPICE ELIGIBILITY/DIAGNOSIS: TBD  Chief Complaint: Initial Palliative care visit  HISTORY OF PRESENT ILLNESS:  Anthony Fisher is a 87 y.o. year old male  with multiple medical conditions including Dementia which is worsening over the years; more memory loss, babbling and not able to do his ADLs; total care. Dementia impairs his quality of life. His memory loss is worse as the day goes by, having some aggravation in the afternoons. History of CVA, HTN, Aphasia recurrent UTI. This is managed by prophylactic treatment with Cephelaxin 250 mg three times a week.    History obtained from review of EMR, discussion with primary team, caregiver, family and/or Mr. Harrower.  Review and summarization of Epic records shows history from other than  patient. Rest of 10 point ROS asked and negative.  I reviewed as needed, available labs,  patient records, imaging, studies and related documents from the EMR.  ROS General: NAD EYES: denies vision changes ENMT: endorses occasional dysphagia Cardiovascular: denies chest pain/discomfort Pulmonary: denies cough, denies SOB Abdomen: endorses good appetite, denies constipation/diarrhea GU: denies dysuria, urinary frequency MSK:  endorses weakness,  no falls reported Skin: denies rashes or wounds Neurological: denies pain, denies insomnia Psych: Endorses positive mood Heme/lymph/immuno: denies bruises, abnormal bleeding  Physical Exam: Height/Weight: 5 feet 10 inches/145 Ibs Constitutional: NAD General: Well groomed, cooperative EYES: anicteric sclera, lids intact, no discharge  ENMT: Moist mucous membrane CV: S1 S2, RRR, no LE edema Pulmonary: LCTA, no increased work of breathing, no cough, Abdomen: active BS + 4 quadrants, soft and non tender GU: no suprapubic tenderness MSK: weakness, sarcopenia, non ambulatory limited ROM Skin: warm and dry, no rashes or wounds on visible skin Neuro:  weakness, otherwise non focal, confusion/memory loss Psych: non-anxious affect Hem/lymph/immuno: no widespread bruising   PAST MEDICAL HISTORY:  Active Ambulatory Problems    Diagnosis Date Noted   ICH (intracerebral hemorrhage) ([place]) - R thalamic hemorrhage 02/16/2017   IVH (intraventricular hemorrhage) ([place]) 02/23/2017   Hypertensive emergency 02/23/2017   Dysphagia due to recent stroke 02/23/2017   Urinary retention 02/23/2017   Dementia ([place]) 02/23/2017   Congestive heart failure ([place]) 02/23/2017   Hypokalemia 02/23/2017   Thalamic hemorrhage ([place]) 02/23/2017   Hemiparesis of left dominant side due to nontraumatic intracerebral hemorrhage ([facility]) 02/24/2017   Benign essential HTN    Labile blood pressure    Prerenal azotemia    CHF (congestive heart failure) ([place])    Fall    Fever    Bacterial UTI 85/96/2952   Acute metabolic encephalopathy 85/13/2440   Essential  hypertension 07/13/2018   Sepsis ([place]) 07/13/2018   Chronic diastolic CHF (congestive heart failure) ([place]) 07/13/2018   Aspiration pneumonia ([place]) 07/13/2018   Acute lower UTI 07/15/2018   HCAP (healthcare-associated pneumonia)    Palliative care by specialist    Goals of care, counseling/discussion    Resolved Ambulatory Problems    Diagnosis Date Noted   No Resolved Ambulatory Problems   Past Medical History:  Diagnosis Date   Adenomatous polyp of colon     SOCIAL HX:  Social History   Tobacco Use   Smoking status: Never   Smokeless tobacco: Never  Substance Use Topics   Alcohol use: No     FAMILY HX:  Family History  Problem Relation Age of Onset   Dementia Mother    Heart attack Father       ALLERGIES:  Allergies  Allergen Reactions   Tape Other (See Comments)    SKIN IS THIN (MAY TEAR AND/OR BRUISE EASILY)      PERTINENT MEDICATIONS:  Outpatient Encounter Medications as of 12/03/2021  Medication Sig   aspirin EC 81 MG tablet Take 1 tablet (81 mg total) by mouth daily. (Patient not taking: Reported on 07/13/2018)   ipratropium-albuterol (DUONEB) 0.5-2.5 (3) MG/3ML SOLN Take 3 mLs by nebulization every 2 (two) hours as needed.   tamsulosin (FLOMAX) 0.4 MG CAPS capsule Take 1 capsule (0.4 mg total) by mouth daily.   No facility-administered encounter medications on file as of 12/03/2021.     Thank you for the opportunity to participate in the care of Mr. Broyhill.  The palliative care team will continue to follow. Please call our office at [phone] if we can be of  additional assistance.   Note: Portions of this note were generated with Lobbyist. Dictation errors may occur despite best attempts at proofreading.  Teodoro Spray, NP

## 2022-04-20 ENCOUNTER — Other Ambulatory Visit: Payer: Medicare Other | Admitting: Hospice

## 2023-04-07 DEATH — deceased
